# Patient Record
Sex: Female | Born: 1980 | Race: White | Hispanic: No | Marital: Married | Smoking: Never smoker
Health system: Southern US, Community
[De-identification: ages and names within clinical notes are randomized; demographics above are authoritative.]

## PROBLEM LIST (undated history)

## (undated) DIAGNOSIS — D649 Anemia, unspecified: Secondary | ICD-10-CM

## (undated) DIAGNOSIS — F329 Major depressive disorder, single episode, unspecified: Secondary | ICD-10-CM

## (undated) DIAGNOSIS — R251 Tremor, unspecified: Secondary | ICD-10-CM

## (undated) DIAGNOSIS — N200 Calculus of kidney: Secondary | ICD-10-CM

## (undated) DIAGNOSIS — F32A Depression, unspecified: Secondary | ICD-10-CM

## (undated) DIAGNOSIS — O139 Gestational [pregnancy-induced] hypertension without significant proteinuria, unspecified trimester: Secondary | ICD-10-CM

## (undated) DIAGNOSIS — Z8489 Family history of other specified conditions: Secondary | ICD-10-CM

## (undated) DIAGNOSIS — K219 Gastro-esophageal reflux disease without esophagitis: Secondary | ICD-10-CM

## (undated) DIAGNOSIS — O24419 Gestational diabetes mellitus in pregnancy, unspecified control: Secondary | ICD-10-CM

## (undated) DIAGNOSIS — K2970 Gastritis, unspecified, without bleeding: Secondary | ICD-10-CM

## (undated) DIAGNOSIS — F429 Obsessive-compulsive disorder, unspecified: Secondary | ICD-10-CM

## (undated) DIAGNOSIS — R51 Headache: Secondary | ICD-10-CM

## (undated) DIAGNOSIS — F419 Anxiety disorder, unspecified: Secondary | ICD-10-CM

## (undated) HISTORY — DX: Gastro-esophageal reflux disease without esophagitis: K21.9

## (undated) HISTORY — DX: Obsessive-compulsive disorder, unspecified: F42.9

## (undated) HISTORY — DX: Gestational diabetes mellitus in pregnancy, unspecified control: O24.419

## (undated) HISTORY — PX: MOUTH SURGERY: SHX715

## (undated) HISTORY — DX: Major depressive disorder, single episode, unspecified: F32.9

## (undated) HISTORY — DX: Anxiety disorder, unspecified: F41.9

## (undated) HISTORY — DX: Headache: R51

## (undated) HISTORY — DX: Anemia, unspecified: D64.9

## (undated) HISTORY — DX: Depression, unspecified: F32.A

## (undated) HISTORY — PX: LITHOTRIPSY: SUR834

---
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
## 2001-12-24 ENCOUNTER — Other Ambulatory Visit: Admission: RE | Admit: 2001-12-24 | Discharge: 2001-12-24 | Payer: Self-pay | Admitting: *Deleted

## 2003-01-04 ENCOUNTER — Other Ambulatory Visit: Admission: RE | Admit: 2003-01-04 | Discharge: 2003-01-04 | Payer: Self-pay | Admitting: Obstetrics & Gynecology

## 2004-09-28 ENCOUNTER — Inpatient Hospital Stay (HOSPITAL_COMMUNITY): Admission: AD | Admit: 2004-09-28 | Discharge: 2004-10-01 | Payer: Self-pay | Admitting: Obstetrics and Gynecology

## 2004-10-05 ENCOUNTER — Encounter: Admission: RE | Admit: 2004-10-05 | Discharge: 2004-10-20 | Payer: Self-pay | Admitting: Obstetrics and Gynecology

## 2008-06-22 ENCOUNTER — Encounter: Admission: RE | Admit: 2008-06-22 | Discharge: 2008-06-22 | Payer: Self-pay | Admitting: Obstetrics and Gynecology

## 2008-08-27 ENCOUNTER — Inpatient Hospital Stay (HOSPITAL_COMMUNITY): Admission: AD | Admit: 2008-08-27 | Discharge: 2008-08-30 | Payer: Self-pay | Admitting: Obstetrics and Gynecology

## 2008-12-05 ENCOUNTER — Emergency Department (HOSPITAL_BASED_OUTPATIENT_CLINIC_OR_DEPARTMENT_OTHER): Admission: EM | Admit: 2008-12-05 | Discharge: 2008-12-06 | Payer: Self-pay | Admitting: Emergency Medicine

## 2010-10-22 NOTE — L&D Delivery Note (Signed)
Delivery Note At  a viable and healthy female was delivered via  (Presentation: ; ROA ).  APGAR: 8/9 weight 7 pounds and 14 oz.   Placenta status: S/I with 3 vessel  Cord:  with the following complications: .None  Anesthesia:  Epidural  Episiotomy: none Lacerations: none Suture Repair: none Est. Blood Loss (mL): 300  Mom to postpartum.  Baby to nursery-stable.  Catheryne Deford J 07/06/2011, 4:02 PM

## 2010-12-07 LAB — HIV ANTIBODY (ROUTINE TESTING W REFLEX): HIV: NONREACTIVE

## 2010-12-07 LAB — ANTIBODY SCREEN: Antibody Screen: NEGATIVE

## 2011-03-06 NOTE — H&P (Signed)
NAME:  Charlotte Nelson, STAHLE             ACCOUNT NO.:  [id number]   MEDICAL RECORD NO.:  [id number]          PATIENT TYPE:  INP   LOCATION:  9163                          FACILITY:  [facility]   PHYSICIAN:  Lenoard Aden, M.D.DATE OF BIRTH:  22-Sep-1981   DATE OF ADMISSION:  08/27/2008  DATE OF DISCHARGE:                              HISTORY & PHYSICAL   CHIEF COMPLAINT:  Class A2 diabetes mellitus at 39 weeks for induction.   She is a 30 year old G2, P1 at [redacted] weeks gestation who presents for  cervical ripening induction with class A2 diabetes mellitus now at 39  weeks.   She has allergies to SULFA.   MEDICATIONS:  Prenatal vitamins and glyburide.   FAMILY HISTORY:  1. Hypertension.  2. Breast cancer.  3. Lymphoma.  4. Heart disease.  5. Diabetes.  6. Kidney stones.  7. Migraine headaches.  8. Anxiety disorder.   She is a nonsmoker and nondrinker.  Denies domestic or physical  violence.   OBSTETRIC HISTORY:  Uncomplicated vaginal delivery over 7 pounds 13  ounces fetus complicated by severe preeclampsia in 2005.  Prenatal  course complicated by class A2 diabetes mellitus, controlled on  glyburide.   PHYSICAL EXAMINATION:  GENERAL:  She is a well-developed, well-nourished  white female in no acute distress.  HEENT: Normal.  LUNGS:  Clear.  HEART:  Regular rate and rhythm.  ABDOMEN:  Soft, gravid, and nontender.  Estimated fetal weight 7-1/2  pounds.  Cervix is 1-2, 50% vertex, -1. EXTREMITIES:  No cords.  NEURO:  Nonfocal.  SKIN:  Intact.   Blood sugar is 84.  NST is reactive.  Cervidil was placed.   IMPRESSION:  1. A 39-week intrauterine pregnancy.  2. Class A2 diabetes mellitus.   We are going to proceed with cervical ripening induction.  Anticipate  attempts at vaginal delivery.      Lenoard Aden, M.D.  Electronically Signed     RJT/MEDQ  D:  08/27/2008  T:  08/28/2008  Job:  [id number]

## 2011-03-09 NOTE — H&P (Signed)
NAME:  Charlotte Nelson, CHUONG             ACCOUNT NO.:  [id number]   MEDICAL RECORD NO.:  [id number]          PATIENT TYPE:  INP   LOCATION:  9169                          [facility]ACILITY:  [facility]   PHYSICIAN:  Lenoard Aden, M.D.DATE OF BIRTH:  20-Jul-1981   DATE OF ADMISSION:  09/28/2004  DATE OF DISCHARGE:                                HISTORY & PHYSICAL   CHIEF COMPLAINT:  Mild preeclampsia.   HISTORY OF PRESENT ILLNESS:  She is a 30 year old white female, G1, P0, [facility]  October 23, 2004 at 36-3/7th weeks, who presents with proteinuria and  hypertension.   ALLERGIES:  SULFA.   MEDICATIONS:  Prenatal vitamins.   OBSTETRICAL HISTORY:  Noncontributory.   PAST MEDICAL HISTORY:  1.  History of migraine headaches.  2.  History of depression.  3.  History of wisdom tooth removal.   FAMILY HISTORY:  Urolithiasis, hypothyroidism, congestive heart failure, and  hypertension.   PRENATAL LABORATORY DATA:  Blood type of 0 negative.  Status post RhoGAM at  28 weeks.  Toxoplasmosis negative.  VDRL nonreactive.  Rubella immune.  Hepatitis nonreactive.  HIV nonreactive.  GBS is negative.   PREGNANCY COURSE:  Uncomplicated until [redacted] weeks gestation, at which time  mild hypertension was noted.  She has had a 24-hour urine revealing 400 mg  of protein in her urine result today and a blood pressure of 150/96.   REVIEW OF SYMPTOMS:  Reveals no evidence of headache, visual symptoms or  epigastric pain.   PHYSICAL EXAMINATION:  GENERAL:  The patient is a well-developed, well-  nourished white female in no acute distress.  VITAL SIGNS:  Weight 192.  Blood pressure 150/96 with proteinuria noted.  HEENT:  Normal.  LUNGS:  Clear.  HEART:  Regular rate and rhythm.  ABDOMEN:  Soft, gravid, and nontender.  Estimated fetal weight is about 7-  1/2 pounds.  No right upper quadrant tenderness is noted.  EXTREMITIES:  Deep tendon reflexes are 2 to 3+ with 1+ pretibial edema.  No  clonus noted.  No cords.  PELVIC:   Cervix is 2 cm thick.  Vertex is -2.  NEUROLOGICAL:  Nonfocal.   IMPRESSION:  1.  A 36-3/7th week intrauterine pregnancy.  2.  Mild preeclampsia with proteinuria and hypertension.   PLAN:  Proceed with cervical ripening in attempts at vaginal delivery.  Magnesium and sulfate and pending serial blood pressures and PIH panel on  admission.      RJT/MEDQ  D:  09/28/2004  T:  09/28/2004  Job:  [id number]   cc:   [facility]

## 2011-04-18 ENCOUNTER — Encounter: Payer: Managed Care, Other (non HMO) | Attending: Obstetrics and Gynecology

## 2011-06-20 DIAGNOSIS — Z01419 Encounter for gynecological examination (general) (routine) without abnormal findings: Secondary | ICD-10-CM

## 2011-06-23 ENCOUNTER — Inpatient Hospital Stay (HOSPITAL_COMMUNITY)
Admission: AD | Admit: 2011-06-23 | Discharge: 2011-06-23 | Disposition: A | Discharge status: Home / Self Care | Payer: Managed Care, Other (non HMO) | Source: Ambulatory Visit | Attending: Obstetrics & Gynecology | Admitting: Obstetrics & Gynecology

## 2011-06-23 ENCOUNTER — Encounter (HOSPITAL_COMMUNITY): Payer: Self-pay

## 2011-06-23 DIAGNOSIS — Z01419 Encounter for gynecological examination (general) (routine) without abnormal findings: Secondary | ICD-10-CM

## 2011-06-23 NOTE — ED Provider Notes (Signed)
Chief Complaint:  Rupture of Membranes   Charlotte Nelson is  30 y.o. G1P0.  No LMP recorded. Patient is pregnant..  Her pregnancy status is positive.  She presents complaining of Rupture of Membranes . Onset is described as gradual and has been present for  1 days. Noticed that panties have been wetter than usual  OB History    Grav Para Term Preterm Abortions TAB SAB Ect Mult Living   1                No past medical history on file.  No past surgical history on file.  No family history on file.  History  Substance Use Topics  . Smoking status: Not on file  . Smokeless tobacco: Not on file  . Alcohol Use: Not on file    Allergies:  Allergies  Allergen Reactions  . Celexa Itching    Pt states that this medication causes prolonged itching.....lasting for about a month.    Prescriptions prior to admission  Medication Sig Dispense Refill  . cetirizine (ZYRTEC) 10 MG tablet Take 10 mg by mouth daily as needed. For allergies       . glyBURIDE (DIABETA) 2.5 MG tablet Take 1.25 mg by mouth 2 (two) times daily with a meal. Pt takes 1/2 tablet at breakfast and 1/2 tablet at dinner       . prenatal vitamin w/FE, FA (PRENATAL 1 + 1) 27-1 MG TABS Take 1 tablet by mouth daily.        . ranitidine (ZANTAC) 150 MG capsule Take 150 mg by mouth at bedtime.          Review of Systems - Negative except damp panties  Physical Exam   Blood pressure 125/87, pulse 109, temperature 99 F (37.2 C), temperature source Oral, resp. rate 18, height 5\' 4"  (1.626 m), weight 87.181 kg (192 lb 3.2 oz).  General: General appearance - alert, well appearing, and in no distress Chest - clear to auscultation, no wheezes, rales or rhonchi, symmetric air entry Heart - normal rate and regular rhythm Pelvic - normal external genitalia, vulva, vagina, cervix.  Normal appearing discharge.  Negative valsalva and fern, WET MOUNT done - results: negative for pathogens, normal epithelial cells,  Focused  Gynecological Exam: CX 1/75/-3  Labs: Recent Results (from the past 24 hour(s))  POCT FERN TEST   Collection Time   06/23/11  3:21 PM      Component Value Range   Fern Test Negative     Imaging Studies:  No results found.   Assessment: There is no problem list on file for this patient.   Plan: Dr. Seymour Bars called and given report on this pt  CRESENZO-DISHMAN,Terena Bohan

## 2011-06-23 NOTE — Progress Notes (Signed)
G3 P2 presents to MAU for ?? ROM

## 2011-06-23 NOTE — Progress Notes (Signed)
Was having sticky yellow tinged discharge, soaked through a panty liner clear fluid, had contractions Friday night, having loosing stools with nausea x 3 days.

## 2011-07-04 ENCOUNTER — Telehealth (HOSPITAL_COMMUNITY): Payer: Self-pay | Admitting: *Deleted

## 2011-07-04 ENCOUNTER — Encounter (HOSPITAL_COMMUNITY): Payer: Self-pay | Admitting: *Deleted

## 2011-07-04 NOTE — Telephone Encounter (Signed)
Preadmission screen  

## 2011-07-06 ENCOUNTER — Encounter (HOSPITAL_COMMUNITY): Payer: Self-pay

## 2011-07-06 ENCOUNTER — Encounter (HOSPITAL_COMMUNITY): Payer: Self-pay | Admitting: Anesthesiology

## 2011-07-06 ENCOUNTER — Inpatient Hospital Stay (HOSPITAL_COMMUNITY): Payer: Managed Care, Other (non HMO) | Admitting: Anesthesiology

## 2011-07-06 ENCOUNTER — Inpatient Hospital Stay (HOSPITAL_COMMUNITY)
Admission: RE | Admit: 2011-07-06 | Discharge: 2011-07-08 | DRG: 775 | Disposition: A | Discharge status: Home / Self Care | Payer: Managed Care, Other (non HMO) | Source: Ambulatory Visit | Attending: Obstetrics and Gynecology | Admitting: Obstetrics and Gynecology

## 2011-07-06 DIAGNOSIS — O24414 Gestational diabetes mellitus in pregnancy, insulin controlled: Secondary | ICD-10-CM | POA: Diagnosis present

## 2011-07-06 DIAGNOSIS — O24419 Gestational diabetes mellitus in pregnancy, unspecified control: Secondary | ICD-10-CM

## 2011-07-06 DIAGNOSIS — O99814 Abnormal glucose complicating childbirth: Principal | ICD-10-CM | POA: Diagnosis present

## 2011-07-06 HISTORY — DX: Gestational diabetes mellitus in pregnancy, unspecified control: O24.419

## 2011-07-06 LAB — CBC
MCHC: 33 g/dL (ref 30.0–36.0)
Platelets: 161 10*3/uL (ref 150–400)
RDW: 14.7 % (ref 11.5–15.5)
WBC: 11.8 10*3/uL — ABNORMAL HIGH (ref 4.0–10.5)

## 2011-07-06 LAB — RPR: RPR Ser Ql: NONREACTIVE

## 2011-07-06 LAB — GLUCOSE, RANDOM: Glucose, Bld: 135 mg/dL — ABNORMAL HIGH (ref 70–99)

## 2011-07-06 MED ORDER — DIBUCAINE 1 % RE OINT: 1.0000 "application " | TOPICAL_OINTMENT | RECTAL | Status: DC | PRN

## 2011-07-06 MED ORDER — PHENYLEPHRINE 40 MCG/ML (10ML) SYRINGE FOR IV PUSH (FOR BLOOD PRESSURE SUPPORT)
80.0000 ug | PREFILLED_SYRINGE | INTRAVENOUS | Status: DC | PRN
  Filled 2011-07-06 (×2): qty 5

## 2011-07-06 MED ORDER — DIPHENHYDRAMINE HCL 25 MG PO CAPS: 25.0000 mg | ORAL_CAPSULE | Freq: Four times a day (QID) | ORAL | Status: DC | PRN

## 2011-07-06 MED ORDER — FLEET ENEMA 7-19 GM/118ML RE ENEM: 1.0000 | ENEMA | RECTAL | Status: DC | PRN

## 2011-07-06 MED ORDER — ONDANSETRON HCL 4 MG/2ML IJ SOLN: 4.0000 mg | INTRAMUSCULAR | Status: DC | PRN

## 2011-07-06 MED ORDER — ZOLPIDEM TARTRATE 5 MG PO TABS
5.0000 mg | ORAL_TABLET | Freq: Every evening | ORAL | Status: DC | PRN
Start: 2011-07-06 — End: 2011-07-08

## 2011-07-06 MED ORDER — LACTATED RINGERS IV SOLN
INTRAVENOUS | Status: DC
  Administered 2011-07-06 (×2): via INTRAVENOUS

## 2011-07-06 MED ORDER — BENZOCAINE-MENTHOL 20-0.5 % EX AERO: 1.0000 "application " | INHALATION_SPRAY | CUTANEOUS | Status: DC | PRN

## 2011-07-06 MED ORDER — SENNOSIDES-DOCUSATE SODIUM 8.6-50 MG PO TABS
2.0000 | ORAL_TABLET | Freq: Every day | ORAL | Status: DC
  Administered 2011-07-06: 2 via ORAL

## 2011-07-06 MED ORDER — PRENATAL PLUS 27-1 MG PO TABS
1.0000 | ORAL_TABLET | Freq: Every day | ORAL | Status: DC
  Administered 2011-07-07 – 2011-07-08 (×2): 1 via ORAL
  Filled 2011-07-06 (×2): qty 1

## 2011-07-06 MED ORDER — WITCH HAZEL-GLYCERIN EX PADS: 1.0000 "application " | MEDICATED_PAD | CUTANEOUS | Status: DC | PRN

## 2011-07-06 MED ORDER — TERBUTALINE SULFATE 1 MG/ML IJ SOLN: 0.2500 mg | Freq: Once | INTRAMUSCULAR | Status: DC | PRN

## 2011-07-06 MED ORDER — OXYCODONE-ACETAMINOPHEN 5-325 MG PO TABS
1.0000 | ORAL_TABLET | ORAL | Status: DC | PRN
  Administered 2011-07-07 – 2011-07-08 (×6): 1 via ORAL
  Filled 2011-07-06 (×3): qty 1
  Filled 2011-07-06: qty 2
  Filled 2011-07-06 (×2): qty 1

## 2011-07-06 MED ORDER — CITRIC ACID-SODIUM CITRATE 334-500 MG/5ML PO SOLN: 30.0000 mL | ORAL | Status: DC | PRN

## 2011-07-06 MED ORDER — DIPHENHYDRAMINE HCL 50 MG/ML IJ SOLN: 12.5000 mg | INTRAMUSCULAR | Status: DC | PRN

## 2011-07-06 MED ORDER — METHYLERGONOVINE MALEATE 0.2 MG/ML IJ SOLN: 0.2000 mg | INTRAMUSCULAR | Status: DC | PRN

## 2011-07-06 MED ORDER — METHYLERGONOVINE MALEATE 0.2 MG PO TABS: 0.2000 mg | ORAL_TABLET | ORAL | Status: DC | PRN

## 2011-07-06 MED ORDER — ACETAMINOPHEN 325 MG PO TABS: 650.0000 mg | ORAL_TABLET | ORAL | Status: DC | PRN

## 2011-07-06 MED ORDER — ONDANSETRON HCL 4 MG PO TABS: 4.0000 mg | ORAL_TABLET | ORAL | Status: DC | PRN

## 2011-07-06 MED ORDER — LACTATED RINGERS IV SOLN: 500.0000 mL | Freq: Once | INTRAVENOUS | Status: DC

## 2011-07-06 MED ORDER — TETANUS-DIPHTH-ACELL PERTUSSIS 5-2.5-18.5 LF-MCG/0.5 IM SUSP: 0.5000 mL | Freq: Once | INTRAMUSCULAR | Status: DC

## 2011-07-06 MED ORDER — EPHEDRINE 5 MG/ML INJ
10.0000 mg | INTRAVENOUS | Status: DC | PRN
  Filled 2011-07-06 (×2): qty 4

## 2011-07-06 MED ORDER — ONDANSETRON HCL 4 MG/2ML IJ SOLN: 4.0000 mg | Freq: Four times a day (QID) | INTRAMUSCULAR | Status: DC | PRN

## 2011-07-06 MED ORDER — OXYTOCIN BOLUS FROM INFUSION
500.0000 mL | Freq: Once | INTRAVENOUS | Status: DC
  Filled 2011-07-06: qty 500
  Filled 2011-07-06: qty 1000

## 2011-07-06 MED ORDER — FENTANYL 2.5 MCG/ML BUPIVACAINE 1/10 % EPIDURAL INFUSION (WH - ANES)
14.0000 mL/h | INTRAMUSCULAR | Status: DC
  Administered 2011-07-06 (×2): 14 mL/h via EPIDURAL
  Filled 2011-07-06 (×2): qty 60

## 2011-07-06 MED ORDER — SIMETHICONE 80 MG PO CHEW: 80.0000 mg | CHEWABLE_TABLET | ORAL | Status: DC | PRN

## 2011-07-06 MED ORDER — LACTATED RINGERS IV SOLN
500.0000 mL | INTRAVENOUS | Status: DC | PRN
  Administered 2011-07-06: 1000 mL via INTRAVENOUS

## 2011-07-06 MED ORDER — LIDOCAINE HCL (PF) 1 % IJ SOLN
30.0000 mL | INTRAMUSCULAR | Status: DC | PRN
  Filled 2011-07-06 (×2): qty 30

## 2011-07-06 MED ORDER — LIDOCAINE HCL 1.5 % IJ SOLN
INTRAMUSCULAR | Status: DC | PRN
  Administered 2011-07-06: 2 mL via EPIDURAL
  Administered 2011-07-06 (×2): 5 mL via EPIDURAL

## 2011-07-06 MED ORDER — EPHEDRINE 5 MG/ML INJ
10.0000 mg | INTRAVENOUS | Status: DC | PRN
  Filled 2011-07-06: qty 4

## 2011-07-06 MED ORDER — OXYTOCIN 20 UNITS IN LACTATED RINGERS INFUSION - SIMPLE
1.0000 m[IU]/min | INTRAVENOUS | Status: DC
  Administered 2011-07-06: 4 m[IU]/min via INTRAVENOUS
  Administered 2011-07-06: 2 m[IU]/min via INTRAVENOUS

## 2011-07-06 MED ORDER — OXYCODONE-ACETAMINOPHEN 5-325 MG PO TABS: 2.0000 | ORAL_TABLET | ORAL | Status: DC | PRN

## 2011-07-06 MED ORDER — IBUPROFEN 600 MG PO TABS
600.0000 mg | ORAL_TABLET | Freq: Four times a day (QID) | ORAL | Status: DC
  Administered 2011-07-06 – 2011-07-08 (×8): 600 mg via ORAL
  Filled 2011-07-06 (×9): qty 1

## 2011-07-06 MED ORDER — OXYTOCIN 20 UNITS IN LACTATED RINGERS INFUSION - SIMPLE: 125.0000 mL/h | Freq: Once | INTRAVENOUS | Status: DC

## 2011-07-06 MED ORDER — IBUPROFEN 600 MG PO TABS: 600.0000 mg | ORAL_TABLET | Freq: Four times a day (QID) | ORAL | Status: DC | PRN

## 2011-07-06 MED ORDER — LANOLIN HYDROUS EX OINT: TOPICAL_OINTMENT | CUTANEOUS | Status: DC | PRN

## 2011-07-06 MED ORDER — PHENYLEPHRINE 40 MCG/ML (10ML) SYRINGE FOR IV PUSH (FOR BLOOD PRESSURE SUPPORT)
80.0000 ug | PREFILLED_SYRINGE | INTRAVENOUS | Status: DC | PRN
  Administered 2011-07-06: 80 ug via INTRAVENOUS
  Filled 2011-07-06: qty 5

## 2011-07-06 NOTE — Anesthesia Procedure Notes (Signed)
Epidural Patient location during procedure: OB Start time: 07/06/2011 12:10 PM Reason for block: procedure for pain  Staffing Performed by: anesthesiologist   Preanesthetic Checklist Completed: patient identified, site marked, surgical consent, pre-op evaluation, timeout performed, IV checked, risks and benefits discussed and monitors and equipment checked  Epidural Patient position: sitting Prep: site prepped and draped and DuraPrep Patient monitoring: continuous pulse ox and blood pressure Approach: midline Injection technique: LOR air  Needle:  Needle type: Tuohy  Needle gauge: 17 G Needle length: 9 cm Needle insertion depth: 5 cm cm Catheter type: closed end flexible Catheter size: 19 Gauge Catheter at skin depth: 10 cm Test dose: negative  Assessment Events: blood not aspirated, injection not painful, no injection resistance, negative IV test and no paresthesia

## 2011-07-06 NOTE — Anesthesia Postprocedure Evaluation (Signed)
  Anesthesia Post-op Note  Patient: Charlotte Nelson  Procedure(s) Performed: * No procedures listed *  Patient Location: PACU and Mother/Baby  Anesthesia Type: Epidural  Level of Consciousness: awake, alert  and oriented  Airway and Oxygen Therapy: Patient Spontanous Breathing   Post-op Assessment: Post-op Vital signs reviewed, Patient's Cardiovascular Status Stable, No headache, No backache, No residual numbness and No residual motor weakness  Post-op Vital Signs: Reviewed and stable  Complications: No apparent anesthesia complications

## 2011-07-06 NOTE — Anesthesia Preprocedure Evaluation (Signed)
Anesthesia Evaluation  Name, MR# and DOB Patient awake  General Assessment Comment  Reviewed: Allergy & Precautions, H&P , NPO status , Patient's Chart, lab work & pertinent test results, reviewed documented beta blocker date and time   History of Anesthesia Complications Negative for: history of anesthetic complications  Airway Mallampati: II TM Distance: >3 FB Neck ROM: full    Dental  (+) Teeth Intact   Pulmonary  clear to auscultation  breath sounds clear to auscultation none    Cardiovascular regular Normal    Neuro/Psych   Headaches (ocular migraines)  (+) PSYCHIATRIC DISORDERS (OCD, anxiety, depression),    GI/Hepatic/Renal   negative Liver ROS  negative Renal ROS   GERD      Endo/Other  (+) Diabetes mellitus-, Gestational, Oral Hypoglycemic Agents,     Abdominal   Musculoskeletal   Hematology negative hematology ROS (+)   Peds  Reproductive/Obstetrics (+) Pregnancy    Anesthesia Other Findings             Anesthesia Physical Anesthesia Plan  ASA: II  Anesthesia Plan: Epidural   Post-op Pain Management:    Induction:   Airway Management Planned:   Additional Equipment:   Intra-op Plan:   Post-operative Plan:   Informed Consent: I have reviewed the patients History and Physical, chart, labs and discussed the procedure including the risks, benefits and alternatives for the proposed anesthesia with the patient or authorized representative who has indicated his/her understanding and acceptance.     Plan Discussed with:   Anesthesia Plan Comments:         Anesthesia Quick Evaluation

## 2011-07-06 NOTE — Progress Notes (Signed)
Charlotte Nelson is a 30 y.o. [id number] at [redacted]w[redacted]d by LMP admitted for induction of labor due to Diabetes.  Subjective:   Objective: BP 127/88  Pulse 87  Temp(Src) 98.5 F (36.9 C) (Oral)  Resp 18  Ht 5\' 4"  (1.626 m)  Wt 89.812 kg (198 lb)  BMI 33.99 kg/m2      FHT:  FHR: 150 bpm, variability: moderate,  accelerations:  Present,  decelerations:  Absent UC:   irregular, every 4 minutes SVE:   Dilation: 2.5 Effacement (%): 50 Station: -2 Exam by:: d herr rn AROM- clear Labs: Lab Results  Component Value Date   WBC 11.8* 07/06/2011   HGB 12.5 07/06/2011   HCT 37.9 07/06/2011   MCV 78.3 07/06/2011   PLT 161 07/06/2011    Assessment / Plan: Induction of labor due to gestational diabetes,  progressing well on pitocin  Labor: Progressing on Pitocin, will continue to increase then AROM Preeclampsia:  na Fetal Wellbeing:  Category I Pain Control:  Labor support without medications I/D:  n/a Anticipated MOD:  NSVD  Charlotte Nelson J 07/06/2011, 9:38 AM

## 2011-07-07 ENCOUNTER — Encounter (HOSPITAL_COMMUNITY): Payer: Self-pay

## 2011-07-07 DIAGNOSIS — O24419 Gestational diabetes mellitus in pregnancy, unspecified control: Secondary | ICD-10-CM

## 2011-07-07 DIAGNOSIS — O24414 Gestational diabetes mellitus in pregnancy, insulin controlled: Secondary | ICD-10-CM | POA: Diagnosis present

## 2011-07-07 HISTORY — DX: Gestational diabetes mellitus in pregnancy, unspecified control: O24.419

## 2011-07-07 LAB — CBC
MCHC: 32.6 g/dL (ref 30.0–36.0)
MCV: 78.7 fL (ref 78.0–100.0)
Platelets: 129 10*3/uL — ABNORMAL LOW (ref 150–400)
RDW: 14.9 % (ref 11.5–15.5)
WBC: 12.1 10*3/uL — ABNORMAL HIGH (ref 4.0–10.5)

## 2011-07-07 MED ORDER — RHO D IMMUNE GLOBULIN 1500 UNIT/2ML IJ SOLN
300.0000 ug | Freq: Once | INTRAMUSCULAR | Status: AC
  Administered 2011-07-07: 300 ug via INTRAMUSCULAR
  Filled 2011-07-07: qty 2

## 2011-07-07 NOTE — Progress Notes (Signed)
  Post Partum Day #1            Subjective:   Pt reports feeling well, but tired/ Tolerating po/ Voiding without problems/ No n/v/ Bleeding is moderate/ Pain controlled withprescription NSAID's including Motrin  Feels ears are stuffy, no pain or vertigo.  Newborn info:  Information for the patient's newborn:  Charlotte Nelson, Girl Meganne [[id number]]  female   feeding breast          Objective:     VS: Blood pressure 106/74, pulse 64, temperature 97.8 F (36.6 C), temperature source Oral, resp. rate 20, height 5\' 4"  (1.626 m), weight 89.812 kg (198 lb), SpO2 96.00%, currently breastfeeding.    Intake/Output Summary (Last 24 hours) at 07/07/11 0933 Last data filed at 07/06/11 1606  Gross per 24 hour  Intake      0 ml  Output    300 ml  Net   -300 ml         Basename 07/07/11 0500 07/06/11 0700  WBC 12.1* 11.8*  HGB 11.1* 12.5  HCT 34.0* 37.9  PLT 129* 161     Blood type: --/--/O NEG (09/15 0525)/ infant Rh pos  Rubella: Immune (02/16 0000)     Physical Exam:   A & O x 3 NAD   Lungs: CTAB  Heart: RR  Abdomen: soft, non-tender, FF @ U-1  Perineum: intact, edema minimal  Lochia: small  Extremities: neg Homans', edema none    Assessment/Plan:  PPD # 1 / 30 y.o., [id number] S/P:induced vaginal Principal Problem:  *Postpartum care following vaginal delivery Active Problems:  Gestational diabetes mellitus, class A2   normal postpartum exam  Doing well  Continue routine post partum orders  Anticipate D/c in AM     LOS: 1 day   [name], CNM, MSN 07/07/2011, 9:33 AM

## 2011-07-07 NOTE — H&P (Signed)
NAMEAMIYA, Charlotte Nelson             ACCOUNT NO.:  [id number]  MEDICAL RECORD NO.:  [id number]  LOCATION:  9116                          FACILITY:  [facility]  PHYSICIAN:  Lenoard Aden, M.D.DATE OF BIRTH:  11-16-1980  DATE OF ADMISSION:  07/06/2011 DATE OF DISCHARGE:                             HISTORY & PHYSICAL   CHIEF COMPLAINT:  Induction.  HISTORY OF PRESENT ILLNESS:  She is a 30 year old white female G3, P2, with a history of history of vaginal delivery x2 who presents for induction at 39 weeks due to class A2, medication dependent diabetes mellitus.  Her prenatal course was complicated as noted.  ALLERGIES:  She has allergies to CELEXA.  MEDICATIONS:  Prenatal vitamins and no other medications.  PERSONAL HISTORY:  Remarkable for migraine headache, anxiety, depression, OCD, and reflux.  SOCIAL HISTORY:  Noncontributory.  She is a nonsmoker, nondrinker.  She denies domestic or physical violence.  FAMILY HISTORY:  Kidney stones, lymphoma, breast cancer, migraine headache, heart disease, lung cancer, diabetes, stroke, COPD, and anxiety, history of 2 vaginal deliveries as previously noted.  Prenatal course complicated by Glyburide-dependent diabetes mellitus for which her fastings ranged from 90-100 range.  PHYSICAL EXAMINATION:  GENERAL:  Well-developed, well-nourished white female in no acute distress. HEENT:  Normal. NECK:  Supple.  Full range of motion. LUNGS:  Clear. HEART:  Regular rate and rhythm. ABDOMEN:  Soft, gravid, and nontender.  Estimated fetal weight 8 pounds. Cervix is 2-3, 80% vertex, -2. EXTREMITIES:  There are no cords. NEUROLOGIC:  Nonfocal. SKIN:  Intact.  NST is reactive.  Category one tracing noted.  IMPRESSION:  A 39 weeks OB or intrauterine pregnancy with fasting in the 90-100 range.  Check glucose q.4 h. and anticipated attempts at vaginal delivery, epidural as needed.     Lenoard Aden, M.D.     RJT/MEDQ  D:  07/06/2011  T:   07/07/2011  Job:  [id number]

## 2011-07-08 MED ORDER — IBUPROFEN 600 MG PO TABS: 600.0000 mg | ORAL_TABLET | Freq: Four times a day (QID) | ORAL | Status: AC

## 2011-07-08 MED ORDER — OXYCODONE-ACETAMINOPHEN 5-325 MG PO TABS: 1.0000 | ORAL_TABLET | ORAL | Status: AC | PRN

## 2011-07-08 NOTE — Discharge Summary (Signed)
Obstetric Discharge Summary Reason for Admission: induction of labor, GDM A2 Prenatal Procedures: NST and ultrasound Intrapartum Procedures: spontaneous vaginal delivery Postpartum Procedures: Rho(D) Ig Complications-Operative and Postpartum: none Hemoglobin  Date Value Range Status  07/07/2011 11.1* 12.0-15.0 (g/dL) Final     HCT  Date Value Range Status  07/07/2011 34.0* 36.0-46.0 (%) Final    Discharge Diagnoses: Term Pregnancy-delivered  Discharge Information: Date: 07/08/2011 Activity: pelvic rest Diet: routine Medications: PNV, Ibuprophen and Percocet Condition: stable Instructions: refer to practice specific booklet Discharge to: home Follow-up Information    Follow up with TAVOON,RICHARD. Make an appointment in 6 weeks.         Newborn Data: Live born female  Birth Weight: 7 lb 14.3 oz (3581 g) APGAR: 9, 9  Home with mother.  Charlotte Nelson 07/08/2011, 9:29 AM

## 2011-07-08 NOTE — Progress Notes (Signed)
Post Partum Day #2           Information for the patient's newborn:  Robar, Girl Ziyanna [[id number]]  female    Subjective: No HA, SOB, CP, F/C, breast symptoms. Pain  Controlled w/ Motrin and Percocet. Normal vaginal bleeding, no clots.    Feeding: breast  Objective: BP 117/77  Pulse 78  Temp(Src) 98.1 F (36.7 C) (Oral)  Resp 18  Ht 5\' 4"  (1.626 m)  Wt 89.812 kg (198 lb)  BMI 33.99 kg/m2  SpO2 97%  Breastfeeding? Unknown  No intake or output data in the 24 hours ending 07/08/11 0923     Basename 07/07/11 0500 07/06/11 0700  WBC 12.1* 11.8*  HGB 11.1* 12.5  HCT 34.0* 37.9  PLT 129* 161    Blood type: --/--/O NEG (09/15 0525) Rubella: Immune (02/16 0000)    Physical Exam:  General: alert, cooperative and no distress Uterine Fundus: firm Lochia: appropriate Perineum: repair none, edema none DVT Evaluation: Negative Homan's sign. No significant calf/ankle edema.    Assessment/Plan: PPD # 2 / 30 y.o., [id number] S/P:induced vaginal Principal Problem:  *Postpartum care following vaginal delivery (9/14) Active Problems:  Gestational diabetes mellitus, class A2    normal postpartum exam  Continue current postpartum care  Rhogam received  TdaP and flu shot up to date  D/C home   LOS: 2 days   PAUL,DANIELA, CNM, MSN 07/08/2011, 9:23 AM

## 2011-07-09 ENCOUNTER — Ambulatory Visit (HOSPITAL_COMMUNITY)
Admission: RE | Admit: 2011-07-09 | Discharge: 2011-07-09 | Disposition: A | Discharge status: Home / Self Care | Payer: Managed Care, Other (non HMO) | Source: Ambulatory Visit | Attending: Obstetrics and Gynecology | Admitting: Obstetrics and Gynecology

## 2011-07-09 LAB — RH IG WORKUP (INCLUDES ABO/RH)
ABO/RH(D): O NEG
Antibody Screen: POSITIVE
Unit division: 0

## 2011-07-09 NOTE — Progress Notes (Addendum)
Infant Lactation Consultation Outpatient Visit Note  Patient Name: Charlotte Nelson Date of Birth: Nov 18, 1980 Birth Weight:   Gestational Age at Delivery: Gestational Age: <None> Type of Delivery: Vaginal ,   Breastfeeding History :     Infant 58 days old Charlotte Nelson ) DOB+ 07/06/19` Birth wt = 7-14 , D/C wt 7- 7 ( 7% - 7-5/ 10% 7-1 )  Frequency of Breastfeeding: breast and bottle per mom ( some supplementing in the hospital ( didn't seem satisfied ) and at home difficult latch .  Length of Feeding: attempts at the breast and when infant can't latch have been supplementing with a bottle  Voids: 2 since yesterday at d/c  Stools:4 brown stools  Since yesterday at d/c   Supplementing / Method: bottle ( see note above )  Supplemented 6x's at home 10-70ml , last fed at 1230p - 15ml formula   Pumping:  Type of Pump: hand pump form the hospital ,Has DEBP Medela at home ( missing pump pieces)    Frequency:  Volume: NONE    Comments:    Consultation Evaluation:  Initial Feeding Assessment:        Right breast  Pre-feed Weight:7-3.2 ( 3266g )  Post-feed Weight:7-3.8  ( 3283g )  Amount Transferred: none from the breast only 16 ml from the SNS ( formula ) , The use of the SNS was the only way to keep infant in a pattern .  Comments: Infant fed in a consistent swallow and pattern . Mom needed assistance with SNS and obtaining depth at the breast   Additional Feeding Assessment: Pre-feed Weight: 7-3.8 ( 3282g )  Post-feed Weight: 7-4.4 ( 3302g )  Amount Transferred:none from the breast ,only 20 ml from the SNS  Comments:infant stayed in a consistent pattern with multi swallows and consistent feeding pattern . Mom latched the infant onto the left breast without difficulty ,infant stated pulling back ,SNS added after latch and infant able to stay latched . Fed 15- 20 mins ,   Additional Feeding Assessment: Pre-feed Weight: Post-feed Weight: Amount Transferred: Comments:  Total Breast milk  Transferred this Visit:  Total Supplement Given:   Additional Interventions: Mom has sore nipples  Bilaterally , right has small cracks ,left pinky red ,no cracks noted .  Mom also appears to be exhausted  ( noted dark circles under both eyes. , also mentioned she had been  Up all night with the baby ) . PLan of Care _ 1) Rest when the baby sleeps 2) Treatment of sore nipples , 3) engorgement tx if needed .4) Set up SNS with feeding if needed until milk comes in  5) Extra pumping  Starting today after feeds to enhance milk coming in . 5) Plan B - if unable to latch with or with out SNS ,try appetizer with a dropper or bottle . 6) Call Lactation for further assistance .    Follow-Up" per mom pedis apt 07/10/2011 with Dr.Davis Robbie Lis Pedis ./ Lactation PRN if still having problems with latch .       Charlotte Nelson 07/09/2011, 5:16 PM

## 2011-07-13 ENCOUNTER — Encounter (HOSPITAL_COMMUNITY): Payer: Managed Care, Other (non HMO)

## 2011-07-13 ENCOUNTER — Ambulatory Visit (HOSPITAL_COMMUNITY): Admission: RE | Admit: 2011-07-13 | Payer: Managed Care, Other (non HMO) | Source: Ambulatory Visit

## 2011-07-13 NOTE — Progress Notes (Signed)
Infant Lactation Consultation Outpatient Visit Note  Patient Name: Charlotte Nelson Date of Birth: [birth year] Birth Weight:   Gestational Age at Delivery: Gestational Age: <None> Type of Delivery: Vaginal   Breastfeeding History                   This a F/U visit from Dr. Visit Tues. For weight check per Dr. Luz Brazen  Frequency of Breastfeeding: Per mom every 2-3 hours and on demand ( snacks )  approx . 10- 15x;s in 24 hrs.  Length of Feeding: 10-20 minutes.  Voids: 6 or greater  Stools: 5 yellowish brown   Supplementing / Method: Per mom after Monday 9/17 Lactation visit -used the SNS with formula Monday,Tues. And once or twice on Wed. And stopped .   Type of Pump:manual and DEBP Medela    Frequency: X1 since Monday   Volume:    Comments:Per mom is was amazing how well the SNS worked . My Milk came in at 5-6 days ( Yesterday ) ,I'm still alittle sore initially when the Lauris Poag gets on and then it improves .    Consultation Evaluation:  Initial Feeding Assessment: Pre-feed Weight:7-6.5oz ( 3360g )  Post-feed Weight:7-9.5oz ( 3444g)  Amount Transferred:78 ml  Comments:Mom able to latch infant ,initailly mom having discomfort , ( assisted mom in obtaining depth at the breast ,mom's comfort improved greatly and fed 20-74mins. Mom's breast were very full to start with several small nodules ( especially in the lateral breast ) , also had to hand express 3-25ml off the breast to make the aerolo more compressable to assist in a deeper latch.  . During the feeding reviewed with mom and assist with breast compressions (intermittently) , swollen nodules improved . Infant ended feeding , Mom and grandmother wer very impressed with the weight gain. At the feeding .   Additional Feeding Assessment: Infant sleepy after the 1st breast ,offered the 2nd breast infant latched well , the depth at the breast was easier for mom to obtain after hand expressing some milk off 1st . Infant latched and only took a  few sucks and unlatched . Mom dressed the infant and then Lauris Poag woke up for a short feeding .  No post weight due to feeding being just a short snack . Pre-feed Weight: Post-feed Weight: Amount Transferred: Comments:  Additional Feeding Assessment: Pre-feed Weight: Post-feed Weight: Amount Transferred: Comments:  Total Breast milk Transferred this Visit:  Total Supplement Given:   Additional Interventions:  Tx of sore nipples ( NO breakdown noted with assessment  ) Encouraged mom to massage, hand express ,prepump with a hand pump if needed ( making sure aerolois compressable like a thin sandwich before latching . Also EBM to nipples around feedings and in between ,also reviewed engorgement tx if needed .    Follow-Up:                  Per mom Tuesday Sept 25th with Dr. Earlene Plater office for a weight check . And PRN with Lactation office if needed .       Kathrin Greathouse 07/13/2011, 12:18 PM

## 2011-07-24 LAB — CBC
HCT: 34.3 — ABNORMAL LOW
Hemoglobin: 11.3 — ABNORMAL LOW
Hemoglobin: 13.1
MCHC: 32.1
MCHC: 33
MCV: 81.7
RBC: 4.2
RDW: 13.9
RDW: 14.2

## 2011-07-24 LAB — GLUCOSE, CAPILLARY
Glucose-Capillary: 101 — ABNORMAL HIGH
Glucose-Capillary: 87
Glucose-Capillary: 99

## 2011-07-24 LAB — RH IMMUNE GLOB WKUP(>/=20WKS)(NOT WOMEN'S HOSP): Fetal Screen: NEGATIVE

## 2011-07-24 LAB — RPR: RPR Ser Ql: NONREACTIVE

## 2012-02-01 ENCOUNTER — Other Ambulatory Visit: Payer: Self-pay | Admitting: Family Medicine

## 2012-02-01 DIAGNOSIS — R599 Enlarged lymph nodes, unspecified: Secondary | ICD-10-CM

## 2012-02-05 ENCOUNTER — Ambulatory Visit
Admission: RE | Admit: 2012-02-05 | Discharge: 2012-02-05 | Disposition: A | Discharge status: Home / Self Care | Payer: 59 | Source: Ambulatory Visit | Attending: Family Medicine | Admitting: Family Medicine

## 2012-02-05 DIAGNOSIS — R599 Enlarged lymph nodes, unspecified: Secondary | ICD-10-CM

## 2014-07-19 ENCOUNTER — Emergency Department (HOSPITAL_COMMUNITY): Admission: EM | Admit: 2014-07-19 | Discharge: 2014-07-20 | Discharge status: Against Medical Advice | Payer: 59

## 2014-07-19 NOTE — ED Notes (Signed)
Pt gave registation pager and stated "she was going to [facility] since only a 15 min wait"

## 2014-08-23 ENCOUNTER — Encounter (HOSPITAL_COMMUNITY): Payer: Self-pay

## 2015-05-12 ENCOUNTER — Encounter: Payer: Self-pay | Admitting: Sports Medicine

## 2015-05-12 ENCOUNTER — Ambulatory Visit (INDEPENDENT_AMBULATORY_CARE_PROVIDER_SITE_OTHER): Payer: 59 | Admitting: Sports Medicine

## 2015-05-12 VITALS — BP 111/74 | HR 75 | Ht 64.0 in | Wt 165.0 lb

## 2015-05-12 DIAGNOSIS — G250 Essential tremor: Secondary | ICD-10-CM | POA: Diagnosis not present

## 2015-05-12 DIAGNOSIS — F39 Unspecified mood [affective] disorder: Secondary | ICD-10-CM | POA: Insufficient documentation

## 2015-05-12 DIAGNOSIS — R718 Other abnormality of red blood cells: Secondary | ICD-10-CM

## 2015-05-12 DIAGNOSIS — Z0000 Encounter for general adult medical examination without abnormal findings: Secondary | ICD-10-CM | POA: Insufficient documentation

## 2015-05-12 DIAGNOSIS — N200 Calculus of kidney: Secondary | ICD-10-CM | POA: Diagnosis not present

## 2015-05-12 LAB — POCT URINALYSIS DIPSTICK
Bilirubin, UA: NEGATIVE
Glucose, UA: NEGATIVE
Ketones, UA: NEGATIVE
Nitrite, UA: NEGATIVE
Spec Grav, UA: 1.02
Urobilinogen, UA: 0.2
pH, UA: 7.5

## 2015-05-12 LAB — HEMOGLOBIN A1C
Hgb A1c MFr Bld: 5.8 % — ABNORMAL HIGH (ref <5.7)
Mean Plasma Glucose: 120 mg/dL — ABNORMAL HIGH (ref <117)

## 2015-05-12 MED ORDER — CIPROFLOXACIN HCL 750 MG PO TABS: 750.0000 mg | ORAL_TABLET | Freq: Two times a day (BID) | ORAL | Status: DC

## 2015-05-12 MED ORDER — TAMSULOSIN HCL 0.4 MG PO CAPS: 0.4000 mg | ORAL_CAPSULE | Freq: Every day | ORAL | Status: DC

## 2015-05-12 NOTE — Assessment & Plan Note (Signed)
MCV in the 70s at [facility] with fatigue. Checking anemia panel. We will probably treat this with iron.

## 2015-05-12 NOTE — Assessment & Plan Note (Signed)
Stable and controlled on propranolol.

## 2015-05-12 NOTE — Progress Notes (Signed)
  Subjective:    CC: Establish care.   HPI:  This is a pleasant 34 year old female, she comes in with a several week history of left flank pain, radiating to the left anterior abdomen, no constitutional symptoms. She was seen in the emergency department where a CT scan showed a couple nephroliths, the largest of which was in the lower pole of the left kidney, 12 mm, there are a few more in the right kidney. Pain is moderate, persistent, no fevers.  She has an essential tremor which is well controlled on propranolol  Fatigue: CBC recently did show microcytosis, she does have a history of menometrorrhagia.  Mood disorder: Stable on Wellbutrin.  Past medical history, Surgical history, Family history not pertinant except as noted below, Social history, Allergies, and medications have been entered into the medical record, reviewed, and no changes needed.   Review of Systems: No headache, visual changes, nausea, vomiting, diarrhea, constipation, dizziness, abdominal pain, skin rash, fevers, chills, night sweats, swollen lymph nodes, weight loss, chest pain, body aches, joint swelling, muscle aches, shortness of breath, mood changes, visual or auditory hallucinations.  Objective:    General: Well Developed, well nourished, and in no acute distress.  Neuro: Alert and oriented x3, extra-ocular muscles intact, sensation grossly intact.  HEENT: Normocephalic, atraumatic, pupils equal round reactive to light, neck supple, no masses, no lymphadenopathy, thyroid nonpalpable.  Skin: Warm and dry, no rashes noted.  Cardiac: Regular rate and rhythm, no murmurs rubs or gallops.  Respiratory: Clear to auscultation bilaterally. Not using accessory muscles, speaking in full sentences.  Abdominal: Soft, nontender, nondistended, positive bowel sounds, no masses, no organomegaly. Positive for left costovertebral angle tenderness with a positive Lloyd's punch. Musculoskeletal: Shoulder, elbow, wrist, hip, knee,  ankle stable, and with full range of motion.  Urinalysis shows leukocytes and moderate blood  Impression and Recommendations:    The patient was counselled, risk factors were discussed, anticipatory guidance given.

## 2015-05-12 NOTE — Assessment & Plan Note (Signed)
Left flank pain with a 12 mm stone in the lower pole of the kidney, this was on a CT scan at an outside facility. Persistent pain, hematuria. The stone is large enough to likely warrant urology referral for consideration of lithotripsy. Adding a short course of ciprofloxacin, urine strainer, Flomax.

## 2015-05-12 NOTE — Assessment & Plan Note (Signed)
-   Continue Wellbutrin 

## 2015-05-12 NOTE — Assessment & Plan Note (Addendum)
Checking routine blood work. Lipids were recently normal.

## 2015-05-13 LAB — VITAMIN D 25 HYDROXY (VIT D DEFICIENCY, FRACTURES): Vit D, 25-Hydroxy: 28 ng/mL — ABNORMAL LOW (ref 30–100)

## 2015-05-13 LAB — IRON AND TIBC
%SAT: 24 % (ref 20–55)
Iron: 90 ug/dL (ref 42–145)
TIBC: 376 ug/dL (ref 250–470)
UIBC: 286 ug/dL (ref 125–400)

## 2015-05-13 LAB — FOLATE: Folate: 11.7 ng/mL

## 2015-05-13 LAB — VITAMIN B12: Vitamin B-12: 498 pg/mL (ref 211–911)

## 2015-05-13 LAB — FERRITIN: Ferritin: 118 ng/mL (ref 10–291)

## 2015-05-15 LAB — URINE CULTURE: Colony Count: 5000

## 2015-05-16 ENCOUNTER — Encounter: Payer: Self-pay | Admitting: Sports Medicine

## 2015-05-16 DIAGNOSIS — R718 Other abnormality of red blood cells: Secondary | ICD-10-CM

## 2015-05-16 LAB — HEMOGLOBINOPATHY EVALUATION
Hemoglobin Other: 0 %
Hgb A2 Quant: 2.7 % (ref 2.2–3.2)
Hgb A: 97.3 % (ref 96.8–97.8)
Hgb F Quant: 0 % (ref 0.0–2.0)
Hgb S Quant: 0 %

## 2015-05-16 MED ORDER — VITAMIN D (ERGOCALCIFEROL) 1.25 MG (50000 UNIT) PO CAPS: 50000.0000 [IU] | ORAL_CAPSULE | ORAL | Status: DC

## 2015-05-16 NOTE — Addendum Note (Signed)
Addended by: Silverio Decamp on: 05/16/2015 04:34 PM   Modules accepted: Orders

## 2015-05-17 NOTE — Telephone Encounter (Signed)
Please attempt to add a CBC to the existing blood work. Orders placed.

## 2015-06-09 ENCOUNTER — Encounter: Payer: Self-pay | Admitting: Sports Medicine

## 2015-06-09 ENCOUNTER — Ambulatory Visit (INDEPENDENT_AMBULATORY_CARE_PROVIDER_SITE_OTHER): Payer: 59 | Admitting: Sports Medicine

## 2015-06-09 VITALS — BP 120/81 | HR 83 | Ht 64.0 in | Wt 167.0 lb

## 2015-06-09 DIAGNOSIS — N200 Calculus of kidney: Secondary | ICD-10-CM

## 2015-06-09 DIAGNOSIS — R718 Other abnormality of red blood cells: Secondary | ICD-10-CM

## 2015-06-09 DIAGNOSIS — R1013 Epigastric pain: Secondary | ICD-10-CM | POA: Diagnosis not present

## 2015-06-09 NOTE — Progress Notes (Signed)
  Subjective:    CC: follow-up  HPI: Nephrolithiasis: Left-sided, 2 cm stone burden in the left kidney, symptoms improved significantly with antibiotics, cultures were negative. She did see urology, they are awaiting CT scan before considering shockwave lithotripsy.  Epigastric pain: Present for a few weeks, it does sound as though urology recommended 30 mg of meloxicam daily. She will stop the meloxicam at this dose, and we are going to get a urease breath test when she comes back in a nurse visit, no hematemesis, melanotic, hematochezia.  She does have microcytosis  Microcytosis:  Negative anemia panel, negative hemoglobin electrophoresis. On further review of her medical record, she has been mildly ascitic over the past 6 years. She has also been thrombocytopenic.  Past medical history, Surgical history, Family history not pertinant except as noted below, Social history, Allergies, and medications have been entered into the medical record, reviewed, and no changes needed.   Review of Systems: No fevers, chills, night sweats, weight loss, chest pain, or shortness of breath.   Objective:    General: Well Developed, well nourished, and in no acute distress.  Neuro: Alert and oriented x3, extra-ocular muscles intact, sensation grossly intact.  HEENT: Normocephalic, atraumatic, pupils equal round reactive to light, neck supple, no masses, no lymphadenopathy, thyroid nonpalpable.  Skin: Warm and dry, no rashes. Cardiac: Regular rate and rhythm, no murmurs rubs or gallops, no lower extremity edema.  Respiratory: Clear to auscultation bilaterally. Not using accessory muscles, speaking in full sentences. Abdomen: Soft, nontender, nondistended, normal bowel sounds, no palpable masses, no guarding, no rigidity.   Impression and Recommendations:

## 2015-06-09 NOTE — Assessment & Plan Note (Signed)
Further follow-up with urology, pleasant shockwave lithotripsy versus ureteral stone retrieval. I'm going to check another urinalysis, and we will likely add another anti-biotic.

## 2015-06-09 NOTE — Assessment & Plan Note (Signed)
Anemia panel as well as hemoglobin electrophoresis were both normal. On further review of the medical record there was evidence of thrombocytopenia during pregnancy, as well as anemia without microcytosis. We are going to recheck a CBC with peripheral smear.

## 2015-06-09 NOTE — Assessment & Plan Note (Signed)
Patient has not taken any proton pump inhibitors, she will come back in a nursing visit to do the urease breath test.

## 2015-06-10 LAB — URINALYSIS
Bilirubin Urine: NEGATIVE
Glucose, UA: NEGATIVE
Ketones, ur: NEGATIVE
Leukocytes, UA: NEGATIVE
Nitrite: NEGATIVE
Protein, ur: NEGATIVE
Specific Gravity, Urine: 1.016 (ref 1.001–1.035)
pH: 7 (ref 5.0–8.0)

## 2015-06-10 LAB — PATHOLOGIST SMEAR REVIEW

## 2015-06-10 LAB — CBC
HCT: 38 % (ref 36.0–46.0)
Hemoglobin: 12.4 g/dL (ref 12.0–15.0)
MCH: 25.3 pg — ABNORMAL LOW (ref 26.0–34.0)
MCHC: 32.6 g/dL (ref 30.0–36.0)
MCV: 77.6 fL — ABNORMAL LOW (ref 78.0–100.0)
MPV: 10.2 fL (ref 8.6–12.4)
Platelets: 283 10*3/uL (ref 150–400)
RBC: 4.9 MIL/uL (ref 3.87–5.11)
RDW: 14.2 % (ref 11.5–15.5)
WBC: 8.5 10*3/uL (ref 4.0–10.5)

## 2015-06-10 LAB — URINE CULTURE
Colony Count: NO GROWTH
Organism ID, Bacteria: NO GROWTH

## 2015-06-15 ENCOUNTER — Telehealth: Payer: Self-pay

## 2015-06-15 NOTE — Telephone Encounter (Signed)
Patient is calling to find out if we have received her medical records for her former provider. Please advise.

## 2015-06-15 NOTE — Telephone Encounter (Signed)
CALLED PATIENT ADVISED HER THAT WE HAVE NOT RECEIVED ANY RECORDS AND PATIENT STATED THAT SHE HAD FILLED OUT THE RELEASE AT THE FORMER PROVIDER OFFICE TO RELEASE HER RECORDS TO NEW PCP, ADVISED PATIENT TO CONTACT THAT FORMER PROVIDER OFFICE OR TO FILL OUT A RELEASE FORM HERE AND IT WOULD BE SENT TO FORMER PCP OFFICE TO REQUEST THOSE RECORDS. Charlotte Nelson'

## 2015-06-23 ENCOUNTER — Ambulatory Visit (INDEPENDENT_AMBULATORY_CARE_PROVIDER_SITE_OTHER): Payer: 59 | Admitting: Sports Medicine

## 2015-06-23 VITALS — BP 124/80 | HR 80 | Wt 166.0 lb

## 2015-06-23 DIAGNOSIS — R1013 Epigastric pain: Secondary | ICD-10-CM

## 2015-06-23 NOTE — Assessment & Plan Note (Signed)
Urease breath test as above

## 2015-06-23 NOTE — Progress Notes (Signed)
Patient came into clinic today for H Pylori breath test. Patient does still complain of stomach pain, wonders if she may have an ulcer. Pt states the pain is about a 3/10, but more of a burning sensation than an actual pain. Pt wondered if this was associated with her diet, and then realized it didn't matter what she ate the pain was still there and she even wakes up with the pain now. Pain used to come and go, no it is pretty constant. Pt does also state she ran in a color run in [place] this past weekend, she was dehydrated prior to the race and even more so after. When the race was over she urinated bright red blood. Pt states she went to ED for treatment and is supposed to be scheduled for a Lithotripsy. Pt does have history of kidney stones.Patient was able to preform test without complications, samples were sent down to lab for review. Pt advised we will contact her with results and any change in plan of care. Verbalized understanding, no further questions.

## 2015-06-24 LAB — H. PYLORI BREATH TEST: H. pylori Breath Test: NOT DETECTED

## 2015-07-07 ENCOUNTER — Ambulatory Visit (INDEPENDENT_AMBULATORY_CARE_PROVIDER_SITE_OTHER): Payer: 59 | Admitting: Sports Medicine

## 2015-07-07 ENCOUNTER — Encounter: Payer: Self-pay | Admitting: Sports Medicine

## 2015-07-07 VITALS — BP 128/79 | HR 71 | Ht 64.0 in | Wt 166.0 lb

## 2015-07-07 DIAGNOSIS — G43809 Other migraine, not intractable, without status migrainosus: Secondary | ICD-10-CM

## 2015-07-07 DIAGNOSIS — E041 Nontoxic single thyroid nodule: Secondary | ICD-10-CM | POA: Diagnosis not present

## 2015-07-07 DIAGNOSIS — R1013 Epigastric pain: Secondary | ICD-10-CM

## 2015-07-07 DIAGNOSIS — R718 Other abnormality of red blood cells: Secondary | ICD-10-CM | POA: Diagnosis not present

## 2015-07-07 DIAGNOSIS — G43909 Migraine, unspecified, not intractable, without status migrainosus: Secondary | ICD-10-CM | POA: Insufficient documentation

## 2015-07-07 MED ORDER — NAPROXEN-ESOMEPRAZOLE 500-20 MG PO TBEC: 1.0000 | DELAYED_RELEASE_TABLET | Freq: Two times a day (BID) | ORAL | Status: DC

## 2015-07-07 MED ORDER — FERROUS SULFATE 325 (65 FE) MG PO TBEC: 325.0000 mg | DELAYED_RELEASE_TABLET | Freq: Three times a day (TID) | ORAL | Status: DC

## 2015-07-07 NOTE — Progress Notes (Signed)
  Subjective:    CC: Follow-up  HPI: Nephrolithiasis: Recently had an episode of hematuria, not much pain, and no longer has any flank pain now. She did not see any stones past.  Epigastric pain: Overall negative blood work, negative H. pylori breath test, epigastric pain has also surprisingly resolved.  Microcytosis: Anemia workup was negative with the exception of a low/normal iron saturation. Hemoglobin electrophoresis was negative. Peripheral smear pathologies read showed hyperchromic microcytic RBCs.  Thyroid mass: Patient has noted a nodule in the right side of her thyroid, would like me to further investigate.  Past medical history, Surgical history, Family history not pertinant except as noted below, Social history, Allergies, and medications have been entered into the medical record, reviewed, and no changes needed.   Review of Systems: No fevers, chills, night sweats, weight loss, chest pain, or shortness of breath.   Objective:    General: Well Developed, well nourished, and in no acute distress.  Neuro: Alert and oriented x3, extra-ocular muscles intact, sensation grossly intact.  HEENT: Normocephalic, atraumatic, pupils equal round reactive to light, neck supple, no masses, no lymphadenopathy, thyroid nonpalpable. There is a minimal operable thyroid goiter with a small nodule along the right isthmus. Skin: Warm and dry, no rashes. Cardiac: Regular rate and rhythm, no murmurs rubs or gallops, no lower extremity edema.  Respiratory: Clear to auscultation bilaterally. Not using accessory muscles, speaking in full sentences.  Impression and Recommendations:

## 2015-07-07 NOTE — Assessment & Plan Note (Signed)
Adding Vimovo.

## 2015-07-07 NOTE — Assessment & Plan Note (Signed)
Likely due to constant microscopic hematuria. Did have some epigastric pain. Checking hemoccults, and iron supplementation.

## 2015-07-07 NOTE — Assessment & Plan Note (Signed)
Improved, likely from NSAIDS and gastritis. Switching to Vimovo for pain.

## 2015-07-07 NOTE — Assessment & Plan Note (Signed)
Checking TFTs, thyroid ultrasound.

## 2015-07-08 ENCOUNTER — Ambulatory Visit (HOSPITAL_BASED_OUTPATIENT_CLINIC_OR_DEPARTMENT_OTHER)
Admission: RE | Admit: 2015-07-08 | Discharge: 2015-07-08 | Disposition: A | Discharge status: Home / Self Care | Payer: 59 | Source: Ambulatory Visit | Attending: Sports Medicine | Admitting: Sports Medicine

## 2015-07-08 DIAGNOSIS — E041 Nontoxic single thyroid nodule: Secondary | ICD-10-CM | POA: Diagnosis not present

## 2015-07-08 LAB — T3, FREE: T3, Free: 3.1 pg/mL (ref 2.3–4.2)

## 2015-07-08 LAB — TSH: TSH: 2.153 u[IU]/mL (ref 0.350–4.500)

## 2015-07-08 LAB — T4, FREE: Free T4: 0.92 ng/dL (ref 0.80–1.80)

## 2015-07-26 ENCOUNTER — Other Ambulatory Visit: Payer: Self-pay | Admitting: Urology

## 2015-08-01 ENCOUNTER — Ambulatory Visit (INDEPENDENT_AMBULATORY_CARE_PROVIDER_SITE_OTHER): Payer: 59 | Admitting: Sports Medicine

## 2015-08-01 ENCOUNTER — Encounter: Payer: Self-pay | Admitting: Sports Medicine

## 2015-08-01 VITALS — BP 118/84 | HR 104 | Temp 98.9°F | Wt 167.0 lb

## 2015-08-01 DIAGNOSIS — N200 Calculus of kidney: Secondary | ICD-10-CM

## 2015-08-01 DIAGNOSIS — J069 Acute upper respiratory infection, unspecified: Secondary | ICD-10-CM | POA: Insufficient documentation

## 2015-08-01 DIAGNOSIS — R1013 Epigastric pain: Secondary | ICD-10-CM | POA: Diagnosis not present

## 2015-08-01 DIAGNOSIS — J029 Acute pharyngitis, unspecified: Secondary | ICD-10-CM | POA: Diagnosis not present

## 2015-08-01 LAB — POCT RAPID STREP A (OFFICE): Rapid Strep A Screen: NEGATIVE

## 2015-08-01 MED ORDER — OMEPRAZOLE 40 MG PO CPDR: 40.0000 mg | DELAYED_RELEASE_CAPSULE | Freq: Every day | ORAL | Status: DC

## 2015-08-01 MED ORDER — MAGIC MOUTHWASH W/LIDOCAINE: 15.0000 mL | Freq: Four times a day (QID) | ORAL | Status: DC | PRN

## 2015-08-01 NOTE — Patient Instructions (Signed)
Upper Respiratory Infection, Adult Most upper respiratory infections (URIs) are a viral infection of the air passages leading to the lungs. A URI affects the nose, throat, and upper air passages. The most common type of URI is nasopharyngitis and is typically referred to as "the common cold." URIs run their course and usually go away on their own. Most of the time, a URI does not require medical attention, but sometimes a bacterial infection in the upper airways can follow a viral infection. This is called a secondary infection. Sinus and middle ear infections are common types of secondary upper respiratory infections. Bacterial pneumonia can also complicate a URI. A URI can worsen asthma and chronic obstructive pulmonary disease (COPD). Sometimes, these complications can require emergency medical care and may be life threatening.  CAUSES Almost all URIs are caused by viruses. A virus is a type of germ and can spread from one person to another.  RISKS FACTORS You may be at risk for a URI if:   You smoke.   You have chronic heart or lung disease.  You have a weakened defense (immune) system.   You are very young or very old.   You have nasal allergies or asthma.  You work in crowded or poorly ventilated areas.  You work in health care facilities or schools. SIGNS AND SYMPTOMS  Symptoms typically develop 2-3 days after you come in contact with a cold virus. Most viral URIs last 7-10 days. However, viral URIs from the influenza virus (flu virus) can last 14-18 days and are typically more severe. Symptoms may include:   Runny or stuffy (congested) nose.   Sneezing.   Cough.   Sore throat.   Headache.   Fatigue.   Fever.   Loss of appetite.   Pain in your forehead, behind your eyes, and over your cheekbones (sinus pain).  Muscle aches.  DIAGNOSIS  Your health care provider may diagnose a URI by:  Physical exam.  Tests to check that your symptoms are not due to  another condition such as:  Strep throat.  Sinusitis.  Pneumonia.  Asthma. TREATMENT  A URI goes away on its own with time. It cannot be cured with medicines, but medicines may be prescribed or recommended to relieve symptoms. Medicines may help:  Reduce your fever.  Reduce your cough.  Relieve nasal congestion. HOME CARE INSTRUCTIONS   Take medicines only as directed by your health care provider.   Gargle warm saltwater or take cough drops to comfort your throat as directed by your health care provider.  Use a warm mist humidifier or inhale steam from a shower to increase air moisture. This may make it easier to breathe.  Drink enough fluid to keep your urine clear or pale yellow.   Eat soups and other clear broths and maintain good nutrition.   Rest as needed.   Return to work when your temperature has returned to normal or as your health care provider advises. You may need to stay home longer to avoid infecting others. You can also use a face mask and careful hand washing to prevent spread of the virus.  Increase the usage of your inhaler if you have asthma.   Do not use any tobacco products, including cigarettes, chewing tobacco, or electronic cigarettes. If you need help quitting, ask your health care provider. PREVENTION  The best way to protect yourself from getting a cold is to practice good hygiene.   Avoid oral or hand contact with people with cold   symptoms.   Wash your hands often if contact occurs.  There is no clear evidence that vitamin C, vitamin E, echinacea, or exercise reduces the chance of developing a cold. However, it is always recommended to get plenty of rest, exercise, and practice good nutrition.  SEEK MEDICAL CARE IF:   You are getting worse rather than better.   Your symptoms are not controlled by medicine.   You have chills.  You have worsening shortness of breath.  You have brown or red mucus.  You have yellow or brown nasal  discharge.  You have pain in your face, especially when you bend forward.  You have a fever.  You have swollen neck glands.  You have pain while swallowing.  You have white areas in the back of your throat. SEEK IMMEDIATE MEDICAL CARE IF:   You have severe or persistent:  Headache.  Ear pain.  Sinus pain.  Chest pain.  You have chronic lung disease and any of the following:  Wheezing.  Prolonged cough.  Coughing up blood.  A change in your usual mucus.  You have a stiff neck.  You have changes in your:  Vision.  Hearing.  Thinking.  Mood. MAKE SURE YOU:   Understand these instructions.  Will watch your condition.  Will get help right away if you are not doing well or get worse.   This information is not intended to replace advice given to you by your health care provider. Make sure you discuss any questions you have with your health care provider.   Document Released: 04/03/2001 Document Revised: 02/22/2015 Document Reviewed: 01/13/2014 Elsevier Interactive Patient Education 2016 [employer]  

## 2015-08-01 NOTE — Assessment & Plan Note (Signed)
Adding Prilosec.

## 2015-08-01 NOTE — Assessment & Plan Note (Signed)
Main symptom is sore throat, negative strep, adding Magic mouthwash, continue Vimovo for pain. Treatment will remain symptomatic.

## 2015-08-01 NOTE — Assessment & Plan Note (Signed)
Lithotripsy coming up.

## 2015-08-01 NOTE — Progress Notes (Signed)
  Subjective:    CC: sore throat  HPI: This is a pleasant 34 year old female, she comes in with a several-day history of sore throat, no cough, minimal fevers. No chest pain, shortness of breath, nausea, vomiting, diarrhea, no abdominal pain.  Nephrolithiasis: Initially percutaneous nephrostomy was planned however they're going to go ahead with the lithotripsy first.  Dyspepsia: Improved significantly with discontinuation of NSAIDs, she did go back to taking ibuprofen and has really not been using the the Vimovo, amenable to trying oral PPI.  Past medical history, Surgical history, Family history not pertinant except as noted below, Social history, Allergies, and medications have been entered into the medical record, reviewed, and no changes needed.   Review of Systems: No fevers, chills, night sweats, weight loss, chest pain, or shortness of breath.   Objective:    General: Well Developed, well nourished, and in no acute distress.  Neuro: Alert and oriented x3, extra-ocular muscles intact, sensation grossly intact.  HEENT: Normocephalic, atraumatic, pupils equal round reactive to light, neck supple, no masses, no lymphadenopathy, thyroid nonpalpable. Oropharynx, nasopharynx, ear canals are unremarkable with the exception of only minimal pharyngeal erythema. There is also only minimal turbinate hyperemia. Skin: Warm and dry, no rashes. Cardiac: Regular rate and rhythm, no murmurs rubs or gallops, no lower extremity edema.  Respiratory: Clear to auscultation bilaterally. Not using accessory muscles, speaking in full sentences.  Impression and Recommendations:    Rapid strep test is negative

## 2015-08-02 MED ORDER — OMEPRAZOLE 40 MG PO CPDR: 40.0000 mg | DELAYED_RELEASE_CAPSULE | Freq: Every day | ORAL | Status: DC

## 2015-08-04 ENCOUNTER — Ambulatory Visit: Payer: 59 | Admitting: Sports Medicine

## 2015-08-05 ENCOUNTER — Other Ambulatory Visit: Payer: Self-pay | Admitting: Sports Medicine

## 2015-08-05 DIAGNOSIS — R718 Other abnormality of red blood cells: Secondary | ICD-10-CM

## 2015-08-05 MED ORDER — FERROUS SULFATE 325 (65 FE) MG PO TBEC: 325.0000 mg | DELAYED_RELEASE_TABLET | Freq: Three times a day (TID) | ORAL | Status: DC

## 2015-08-10 ENCOUNTER — Ambulatory Visit (INDEPENDENT_AMBULATORY_CARE_PROVIDER_SITE_OTHER): Payer: 59 | Admitting: Sports Medicine

## 2015-08-10 VITALS — BP 126/88 | HR 85 | Temp 97.8°F | Wt 164.0 lb

## 2015-08-10 DIAGNOSIS — R300 Dysuria: Secondary | ICD-10-CM | POA: Diagnosis not present

## 2015-08-10 DIAGNOSIS — R319 Hematuria, unspecified: Secondary | ICD-10-CM

## 2015-08-10 DIAGNOSIS — N200 Calculus of kidney: Secondary | ICD-10-CM | POA: Diagnosis not present

## 2015-08-10 LAB — POCT URINALYSIS DIPSTICK
Bilirubin, UA: NEGATIVE
Glucose, UA: NEGATIVE
Ketones, UA: NEGATIVE
Nitrite, UA: NEGATIVE
Protein, UA: 30
Spec Grav, UA: >=1.03
Urobilinogen, UA: 0.2
pH, UA: 6

## 2015-08-10 MED ORDER — RANITIDINE HCL 300 MG PO TABS: 300.0000 mg | ORAL_TABLET | Freq: Two times a day (BID) | ORAL | Status: DC

## 2015-08-10 MED ORDER — CIPROFLOXACIN HCL 750 MG PO TABS: 750.0000 mg | ORAL_TABLET | Freq: Two times a day (BID) | ORAL | Status: DC

## 2015-08-10 NOTE — Progress Notes (Signed)
  Subjective:    CC: Left flank pain  HPI: This is a pleasant 34 year old female with a history of severe nephrolithiasis, for the past several days she's had increasing left flank pain, no constitutional symptoms. She is scheduled for lithotripsy in December.  Past medical history, Surgical history, Family history not pertinant except as noted below, Social history, Allergies, and medications have been entered into the medical record, reviewed, and no changes needed.   Review of Systems: No fevers, chills, night sweats, weight loss, chest pain, or shortness of breath.   Objective:    General: Well Developed, well nourished, and in no acute distress.  Neuro: Alert and oriented x3, extra-ocular muscles intact, sensation grossly intact.  HEENT: Normocephalic, atraumatic, pupils equal round reactive to light, neck supple, no masses, no lymphadenopathy, thyroid nonpalpable.  Skin: Warm and dry, no rashes. Cardiac: Regular rate and rhythm, no murmurs rubs or gallops, no lower extremity edema.  Respiratory: Clear to auscultation bilaterally. Not using accessory muscles, speaking in full sentences. Abdomen: Soft, nontender, nondistended, normal bowel sounds, no palpable masses, severe left costovertebral angle pain. No guarding, no rebound tenderness, no rigidity.  Urinalysis is positive for blood, and leukocytes and will be sent off for culture.  Impression and Recommendations:

## 2015-08-10 NOTE — Progress Notes (Signed)
Patient came into clinic today stating she was having back pain, some hematuria, dysuria, and has a known kidney stones at this time. Patient reports no difficulty urinating and was able to provide a urine sample in office today. This urine was tested with POCT urine dipstick and sent for culture. Patient was able to see PCP in room and go over urine dipstick results. An antibiotic will be called in. Pt was advised to take the pain medication (oxycodone) prescribed by her urologist for her pain associated with kidney stones. Patient will also be sent for a CT, advised we would be able to do that in the morning to help accomodate her busy schedule. Verbalized understanding.

## 2015-08-10 NOTE — Assessment & Plan Note (Signed)
Acute pain, urinalysis is positive, obtaining urine culture, Cipro for 14 days, CT of the abdomen and pelvis no contrast. She will fill the pain medication rx ed by her Urologist

## 2015-08-11 ENCOUNTER — Ambulatory Visit (INDEPENDENT_AMBULATORY_CARE_PROVIDER_SITE_OTHER): Payer: 59

## 2015-08-11 DIAGNOSIS — N200 Calculus of kidney: Secondary | ICD-10-CM | POA: Diagnosis not present

## 2015-08-12 LAB — URINE CULTURE: Colony Count: 50000

## 2015-09-02 ENCOUNTER — Ambulatory Visit (INDEPENDENT_AMBULATORY_CARE_PROVIDER_SITE_OTHER): Payer: 59 | Admitting: Sports Medicine

## 2015-09-02 ENCOUNTER — Encounter: Payer: Self-pay | Admitting: Sports Medicine

## 2015-09-02 VITALS — BP 138/89 | HR 104 | Temp 98.0°F | Wt 163.0 lb

## 2015-09-02 DIAGNOSIS — Z23 Encounter for immunization: Secondary | ICD-10-CM | POA: Diagnosis not present

## 2015-09-02 DIAGNOSIS — N200 Calculus of kidney: Secondary | ICD-10-CM

## 2015-09-02 DIAGNOSIS — R0982 Postnasal drip: Secondary | ICD-10-CM | POA: Insufficient documentation

## 2015-09-02 DIAGNOSIS — M549 Dorsalgia, unspecified: Secondary | ICD-10-CM

## 2015-09-02 LAB — POCT URINALYSIS DIPSTICK
Bilirubin, UA: NEGATIVE
Glucose, UA: NEGATIVE
Ketones, UA: NEGATIVE
Leukocytes, UA: NEGATIVE
Nitrite, UA: NEGATIVE
Protein, UA: 30
Spec Grav, UA: 1.025
Urobilinogen, UA: 0.2
pH, UA: 6

## 2015-09-02 MED ORDER — AZELASTINE-FLUTICASONE 137-50 MCG/ACT NA SUSP: NASAL | Status: DC

## 2015-09-02 MED ORDER — CIPROFLOXACIN HCL 750 MG PO TABS: 750.0000 mg | ORAL_TABLET | Freq: Two times a day (BID) | ORAL | Status: DC

## 2015-09-02 NOTE — Assessment & Plan Note (Signed)
Adding Dymista. 

## 2015-09-02 NOTE — Progress Notes (Signed)
  Subjective:    CC: Flank pain  HPI: This is a pleasant 34 year old female with severe nephrolithiasis with a high stone burden, scheduled for lithotripsy at the beginning of December. Last month we treated her with high-dose ciprofloxacin, and her pain went away immediately, unfortunately she is having low-level mild pain in the left flank, no constitutional symptoms and no dysuria.  Past medical history, Surgical history, Family history not pertinant except as noted below, Social history, Allergies, and medications have been entered into the medical record, reviewed, and no changes needed.   Review of Systems: No fevers, chills, night sweats, weight loss, chest pain, or shortness of breath.   Objective:    General: Well Developed, well nourished, and in no acute distress.  Neuro: Alert and oriented x3, extra-ocular muscles intact, sensation grossly intact.  HEENT: Normocephalic, atraumatic, pupils equal round reactive to light, neck supple, no masses, no lymphadenopathy, thyroid nonpalpable.  Skin: Warm and dry, no rashes. Cardiac: Regular rate and rhythm, no murmurs rubs or gallops, no lower extremity edema.  Respiratory: Clear to auscultation bilaterally. Not using accessory muscles, speaking in full sentences. Abdomen: Soft, nontender, nondistended, normal bowel sounds, no palpable masses, only mild tenderness at the left costovertebral angle with a negative Lloyd's punch.  Urinalysis is positive for blood.  Impression and Recommendations:

## 2015-09-02 NOTE — Assessment & Plan Note (Signed)
Persistent never lithiasis with a high stone burden with lithotripsy scheduled for December 1, fantastic improvement with ciprofloxacin over the last month however still has mild her system pain. We are going to continue ciprofloxacin until her lithotripsy, patient advised to avoid exercise and weightbearing activities of possible due to risk of tendon rupture.

## 2015-09-02 NOTE — Addendum Note (Signed)
Addended by: Huel Cote on: 09/02/2015 11:44 AM   Modules accepted: Orders

## 2015-09-04 LAB — URINE CULTURE
Colony Count: NO GROWTH
Organism ID, Bacteria: NO GROWTH

## 2015-09-12 ENCOUNTER — Other Ambulatory Visit: Payer: Self-pay | Admitting: Sports Medicine

## 2015-09-12 DIAGNOSIS — R0982 Postnasal drip: Secondary | ICD-10-CM

## 2015-09-12 MED ORDER — TRIAMCINOLONE ACETONIDE 55 MCG/ACT NA AERO: 2.0000 | INHALATION_SPRAY | Freq: Every day | NASAL | Status: DC

## 2015-09-12 NOTE — Assessment & Plan Note (Signed)
Switching to Nasacort.

## 2015-09-13 ENCOUNTER — Encounter (HOSPITAL_COMMUNITY): Payer: Self-pay

## 2015-09-13 ENCOUNTER — Encounter (HOSPITAL_COMMUNITY)
Admission: RE | Admit: 2015-09-13 | Discharge: 2015-09-13 | Disposition: A | Discharge status: Home / Self Care | Payer: 59 | Source: Ambulatory Visit | Attending: Urology | Admitting: Urology

## 2015-09-13 DIAGNOSIS — Z01818 Encounter for other preprocedural examination: Secondary | ICD-10-CM | POA: Diagnosis present

## 2015-09-13 DIAGNOSIS — N200 Calculus of kidney: Secondary | ICD-10-CM | POA: Diagnosis not present

## 2015-09-13 HISTORY — DX: Gastritis, unspecified, without bleeding: K29.70

## 2015-09-13 HISTORY — DX: Calculus of kidney: N20.0

## 2015-09-13 HISTORY — DX: Family history of other specified conditions: Z84.89

## 2015-09-13 HISTORY — DX: Tremor, unspecified: R25.1

## 2015-09-13 LAB — CBC
HEMATOCRIT: 41.1 % (ref 36.0–46.0)
HEMOGLOBIN: 13.2 g/dL (ref 12.0–15.0)
MCH: 25.6 pg — ABNORMAL LOW (ref 26.0–34.0)
MCHC: 32.1 g/dL (ref 30.0–36.0)
MCV: 79.8 fL (ref 78.0–100.0)
Platelets: 314 10*3/uL (ref 150–400)
RBC: 5.15 MIL/uL — AB (ref 3.87–5.11)
RDW: 13.8 % (ref 11.5–15.5)
WBC: 8.1 10*3/uL (ref 4.0–10.5)

## 2015-09-13 LAB — BASIC METABOLIC PANEL
ANION GAP: 6 (ref 5–15)
BUN: 16 mg/dL (ref 6–20)
CHLORIDE: 108 mmol/L (ref 101–111)
CO2: 26 mmol/L (ref 22–32)
CREATININE: 0.92 mg/dL (ref 0.44–1.00)
Calcium: 9.1 mg/dL (ref 8.9–10.3)
GFR calc non Af Amer: >60 mL/min (ref >60)
Glucose, Bld: 111 mg/dL — ABNORMAL HIGH (ref 65–99)
POTASSIUM: 4.2 mmol/L (ref 3.5–5.1)
Sodium: 140 mmol/L (ref 135–145)

## 2015-09-13 LAB — HCG, SERUM, QUALITATIVE: Preg, Serum: NEGATIVE

## 2015-09-13 NOTE — Patient Instructions (Addendum)
YOUR PROCEDURE IS SCHEDULED ON : 09/22/15  REPORT TO  HOSPITAL MAIN ENTRANCE FOLLOW SIGNS TO EAST ELEVATOR - GO TO 3rd FLOOR CHECK IN AT 3 EAST NURSES STATION (SHORT STAY) AT: 5:30 AM  CALL THIS NUMBER IF YOU HAVE PROBLEMS THE MORNING OF SURGERY [phone]  REMEMBER:ONLY 1 PER PERSON MAY GO TO SHORT STAY WITH YOU TO GET READY THE MORNING OF YOUR SURGERY  DO NOT EAT FOOD OR DRINK LIQUIDS AFTER MIDNIGHT  TAKE THESE MEDICINES THE MORNING OF SURGERY: TRINTELLIX / PROPRANOLOL / CIPRO  YOU MAY NOT HAVE ANY METAL ON YOUR BODY INCLUDING HAIR PINS AND PIERCING'S. DO NOT WEAR JEWELRY, MAKEUP, LOTIONS, POWDERS OR PERFUMES. DO NOT WEAR NAIL POLISH. DO NOT SHAVE 48 HRS PRIOR TO SURGERY. MEN MAY SHAVE FACE AND NECK.  DO NOT [place]. [facility] IS NOT RESPONSIBLE FOR VALUABLES.  CONTACTS, DENTURES OR PARTIALS MAY NOT BE WORN TO SURGERY. LEAVE SUITCASE IN CAR. CAN BE BROUGHT TO ROOM AFTER SURGERY.  PATIENTS DISCHARGED THE DAY OF SURGERY WILL NOT BE ALLOWED TO DRIVE HOME.  PLEASE READ OVER THE FOLLOWING INSTRUCTION SHEETS _________________________________________________________________________________                                          [facility] - PREPARING FOR SURGERY  Before surgery, you can play an important role.  Because skin is not sterile, your skin needs to be as free of germs as possible.  You can reduce the number of germs on your skin by washing with CHG (chlorahexidine gluconate) soap before surgery.  CHG is an antiseptic cleaner which kills germs and bonds with the skin to continue killing germs even after washing. Please DO NOT use if you have an allergy to CHG or antibacterial soaps.  If your skin becomes reddened/irritated stop using the CHG and inform your nurse when you arrive at Short Stay. Do not shave (including legs and underarms) for at least 48 hours prior to the first CHG shower.  You may shave your face. Please follow these  instructions carefully:   1.  Shower with CHG Soap the night before surgery and the  morning of Surgery.   2.  If you choose to wash your hair, wash your hair first as usual with your  normal  Shampoo.   3.  After you shampoo, rinse your hair and body thoroughly to remove the  shampoo.                                         4.  Use CHG as you would any other liquid soap.  You can apply chg directly  to the skin and wash . Gently wash with scrungie or clean wascloth    5.  Apply the CHG Soap to your body ONLY FROM THE NECK DOWN.   Do not use on open                           Wound or open sores. Avoid contact with eyes, ears mouth and genitals (private parts).                        Genitals (private parts) with your normal soap.  6.  Wash thoroughly, paying special attention to the area where your surgery  will be performed.   7.  Thoroughly rinse your body with warm water from the neck down.   8.  DO NOT shower/wash with your normal soap after using and rinsing off  the CHG Soap .                9.  Pat yourself dry with a clean towel.             10.  Wear clean night clothes to bed after shower             11.  Place clean sheets on your bed the night of your first shower and do not  sleep with pets.  Day of Surgery : Do not apply any lotions/deodorants the morning of surgery.  Please wear clean clothes to the hospital/surgery center.  FAILURE TO FOLLOW THESE INSTRUCTIONS MAY RESULT IN THE CANCELLATION OF YOUR SURGERY    PATIENT SIGNATURE_________________________________  ______________________________________________________________________

## 2015-09-22 ENCOUNTER — Encounter (HOSPITAL_COMMUNITY): Payer: Self-pay | Admitting: Anesthesiology

## 2015-09-22 ENCOUNTER — Ambulatory Visit (HOSPITAL_COMMUNITY): Payer: 59 | Admitting: Certified Registered Nurse Anesthetist

## 2015-09-22 ENCOUNTER — Encounter (HOSPITAL_COMMUNITY)
Admission: RE | Disposition: A | Discharge status: Home / Self Care | Payer: Self-pay | Source: Ambulatory Visit | Attending: Urology

## 2015-09-22 ENCOUNTER — Ambulatory Visit (HOSPITAL_COMMUNITY)
Admission: RE | Admit: 2015-09-22 | Discharge: 2015-09-22 | Disposition: A | Discharge status: Home / Self Care | Payer: 59 | Source: Ambulatory Visit | Attending: Urology | Admitting: Urology

## 2015-09-22 ENCOUNTER — Ambulatory Visit (HOSPITAL_COMMUNITY): Payer: 59

## 2015-09-22 DIAGNOSIS — Z01818 Encounter for other preprocedural examination: Secondary | ICD-10-CM

## 2015-09-22 DIAGNOSIS — N200 Calculus of kidney: Secondary | ICD-10-CM | POA: Diagnosis present

## 2015-09-22 DIAGNOSIS — R3129 Other microscopic hematuria: Secondary | ICD-10-CM | POA: Diagnosis present

## 2015-09-22 DIAGNOSIS — F419 Anxiety disorder, unspecified: Secondary | ICD-10-CM | POA: Insufficient documentation

## 2015-09-22 DIAGNOSIS — Z8719 Personal history of other diseases of the digestive system: Secondary | ICD-10-CM | POA: Diagnosis not present

## 2015-09-22 HISTORY — PX: CYSTOSCOPY W/ URETERAL STENT PLACEMENT: SHX1429

## 2015-09-22 LAB — PREGNANCY, URINE: Preg Test, Ur: NEGATIVE

## 2015-09-22 SURGERY — CYSTOSCOPY, WITH RETROGRADE PYELOGRAM AND URETERAL STENT INSERTION
Anesthesia: General | Site: Ureter | Laterality: Left

## 2015-09-22 SURGERY — LITHOTRIPSY, ESWL
Anesthesia: LOCAL | Laterality: Left

## 2015-09-22 MED ORDER — ONDANSETRON HCL 4 MG/2ML IJ SOLN: 4.0000 mg | Freq: Once | INTRAMUSCULAR | Status: DC

## 2015-09-22 MED ORDER — DEXAMETHASONE SODIUM PHOSPHATE 10 MG/ML IJ SOLN
INTRAMUSCULAR | Status: DC | PRN
  Administered 2015-09-22: 10 mg via INTRAVENOUS

## 2015-09-22 MED ORDER — TRAMADOL-ACETAMINOPHEN 37.5-325 MG PO TABS: 1.0000 | ORAL_TABLET | Freq: Four times a day (QID) | ORAL | Status: DC | PRN

## 2015-09-22 MED ORDER — PHENYLEPHRINE HCL 10 MG/ML IJ SOLN
INTRAMUSCULAR | Status: DC | PRN
  Administered 2015-09-22 (×2): 80 ug via INTRAVENOUS
  Administered 2015-09-22 (×2): 40 ug via INTRAVENOUS
  Administered 2015-09-22: 80 ug via INTRAVENOUS

## 2015-09-22 MED ORDER — LACTATED RINGERS IV SOLN
INTRAVENOUS | Status: DC | PRN
  Administered 2015-09-22 (×2): via INTRAVENOUS

## 2015-09-22 MED ORDER — MIDAZOLAM HCL 2 MG/2ML IJ SOLN
INTRAMUSCULAR | Status: AC
  Filled 2015-09-22: qty 2

## 2015-09-22 MED ORDER — SODIUM CHLORIDE 0.9 % IR SOLN
Status: DC | PRN
  Administered 2015-09-22: 3000 mL

## 2015-09-22 MED ORDER — DEXAMETHASONE SODIUM PHOSPHATE 10 MG/ML IJ SOLN
INTRAMUSCULAR | Status: AC
  Filled 2015-09-22: qty 1

## 2015-09-22 MED ORDER — FENTANYL CITRATE (PF) 100 MCG/2ML IJ SOLN
INTRAMUSCULAR | Status: AC
  Filled 2015-09-22: qty 2

## 2015-09-22 MED ORDER — MIDAZOLAM HCL 5 MG/5ML IJ SOLN
INTRAMUSCULAR | Status: DC | PRN
  Administered 2015-09-22: 2 mg via INTRAVENOUS

## 2015-09-22 MED ORDER — PHENYLEPHRINE HCL 10 MG/ML IJ SOLN
INTRAMUSCULAR | Status: AC
  Filled 2015-09-22: qty 1

## 2015-09-22 MED ORDER — CEFAZOLIN SODIUM-DEXTROSE 2-3 GM-% IV SOLR
INTRAVENOUS | Status: AC
  Filled 2015-09-22: qty 50

## 2015-09-22 MED ORDER — FENTANYL CITRATE (PF) 100 MCG/2ML IJ SOLN
INTRAMUSCULAR | Status: DC | PRN
  Administered 2015-09-22 (×2): 50 ug via INTRAVENOUS

## 2015-09-22 MED ORDER — PROPOFOL 10 MG/ML IV BOLUS
INTRAVENOUS | Status: AC
  Filled 2015-09-22: qty 40

## 2015-09-22 MED ORDER — FENTANYL CITRATE (PF) 100 MCG/2ML IJ SOLN
25.0000 ug | INTRAMUSCULAR | Status: DC | PRN
  Administered 2015-09-22: 50 ug via INTRAVENOUS

## 2015-09-22 MED ORDER — ACETAMINOPHEN 10 MG/ML IV SOLN
INTRAVENOUS | Status: AC
  Filled 2015-09-22: qty 100

## 2015-09-22 MED ORDER — OXYCODONE-ACETAMINOPHEN 5-325 MG PO TABS
1.0000 | ORAL_TABLET | ORAL | Status: DC | PRN
  Administered 2015-09-22: 1 via ORAL
  Filled 2015-09-22: qty 1

## 2015-09-22 MED ORDER — DIAZEPAM 5 MG PO TABS: 10.0000 mg | ORAL_TABLET | ORAL | Status: DC

## 2015-09-22 MED ORDER — DIPHENHYDRAMINE HCL 25 MG PO CAPS: 25.0000 mg | ORAL_CAPSULE | ORAL | Status: DC

## 2015-09-22 MED ORDER — PROMETHAZINE HCL 25 MG/ML IJ SOLN
6.2500 mg | INTRAMUSCULAR | Status: DC | PRN
Start: 2015-09-22 — End: 2015-09-22

## 2015-09-22 MED ORDER — ONDANSETRON HCL 4 MG/2ML IJ SOLN
INTRAMUSCULAR | Status: AC
  Filled 2015-09-22: qty 2

## 2015-09-22 MED ORDER — HYOSCYAMINE SULFATE 0.125 MG SL SUBL: 0.1250 mg | SUBLINGUAL_TABLET | SUBLINGUAL | Status: DC | PRN

## 2015-09-22 MED ORDER — UROGESIC-BLUE 81.6 MG PO TABS: ORAL_TABLET | ORAL | Status: DC

## 2015-09-22 MED ORDER — ONDANSETRON HCL 4 MG/2ML IJ SOLN
INTRAMUSCULAR | Status: DC | PRN
  Administered 2015-09-22: 4 mg via INTRAVENOUS

## 2015-09-22 MED ORDER — METOCLOPRAMIDE HCL 5 MG/ML IJ SOLN
INTRAMUSCULAR | Status: DC | PRN
  Administered 2015-09-22: 10 mg via INTRAVENOUS

## 2015-09-22 MED ORDER — LIDOCAINE HCL (CARDIAC) 20 MG/ML IV SOLN
INTRAVENOUS | Status: AC
  Filled 2015-09-22: qty 5

## 2015-09-22 MED ORDER — LIDOCAINE HCL (CARDIAC) 20 MG/ML IV SOLN
INTRAVENOUS | Status: DC | PRN
  Administered 2015-09-22: 100 mg via INTRAVENOUS

## 2015-09-22 MED ORDER — SODIUM CHLORIDE 0.9 % IV SOLN
INTRAVENOUS | Status: DC | PRN
  Administered 2015-09-22: 3 mL

## 2015-09-22 MED ORDER — CEFAZOLIN SODIUM-DEXTROSE 2-3 GM-% IV SOLR
2.0000 g | INTRAVENOUS | Status: AC
  Administered 2015-09-22: 2 g via INTRAVENOUS

## 2015-09-22 MED ORDER — CIPROFLOXACIN HCL 500 MG PO TABS: 500.0000 mg | ORAL_TABLET | ORAL | Status: DC

## 2015-09-22 MED ORDER — HYDROMORPHONE HCL 1 MG/ML IJ SOLN
0.5000 mg | INTRAMUSCULAR | Status: DC | PRN
  Administered 2015-09-22: 0.5 mg via INTRAVENOUS
  Filled 2015-09-22: qty 1

## 2015-09-22 MED ORDER — PROPOFOL 10 MG/ML IV BOLUS
INTRAVENOUS | Status: DC | PRN
  Administered 2015-09-22: 180 mg via INTRAVENOUS

## 2015-09-22 MED ORDER — METOCLOPRAMIDE HCL 5 MG/ML IJ SOLN
INTRAMUSCULAR | Status: AC
  Filled 2015-09-22: qty 2

## 2015-09-22 SURGICAL SUPPLY — 14 items
BAG URO CATCHER STRL LF (DRAPE) ×3 IMPLANT
CATH INTERMIT  6FR 70CM (CATHETERS) ×3 IMPLANT
CLOTH BEACON ORANGE TIMEOUT ST (SAFETY) ×3 IMPLANT
GLOVE BIOGEL M STRL SZ7.5 (GLOVE) ×3 IMPLANT
GOWN STRL REUS W/TWL LRG LVL3 (GOWN DISPOSABLE) ×3 IMPLANT
GOWN STRL REUS W/TWL XL LVL3 (GOWN DISPOSABLE) ×3 IMPLANT
GUIDEWIRE STR DUAL SENSOR (WIRE) ×3 IMPLANT
MANIFOLD NEPTUNE II (INSTRUMENTS) ×3 IMPLANT
NS IRRIG 1000ML POUR BTL (IV SOLUTION) ×3 IMPLANT
PACK CYSTO (CUSTOM PROCEDURE TRAY) ×3 IMPLANT
SCRUB PCMX 4 OZ (MISCELLANEOUS) IMPLANT
STENT URET 6FRX24 CONTOUR (STENTS) ×3 IMPLANT
TUBING CONNECTING 10 (TUBING) ×2 IMPLANT
TUBING CONNECTING 10' (TUBING) ×1

## 2015-09-22 NOTE — Anesthesia Postprocedure Evaluation (Signed)
Anesthesia Post Note  Patient: Charlotte Nelson  Procedure(s) Performed: Procedure(s) (LRB): CYSTOSCOPY WITH LEFT  RETROGRADE PYELOGRAM/ STENT PLACEMENT (Left)  Patient location during evaluation: PACU Anesthesia Type: General Level of consciousness: awake and alert Pain management: pain level controlled Vital Signs Assessment: post-procedure vital signs reviewed and stable Respiratory status: spontaneous breathing, nonlabored ventilation, respiratory function stable and patient connected to nasal cannula oxygen Cardiovascular status: blood pressure returned to baseline and stable Postop Assessment: no signs of nausea or vomiting Anesthetic complications: no    Last Vitals:  Filed Vitals:   09/22/15 1240 09/22/15 1440  BP: 138/93 122/85  Pulse: 63 84  Temp: 36.4 C 36.9 C  Resp: 16 18    Last Pain:  Filed Vitals:   09/22/15 1621  PainSc: 1                  Kashmir Lysaght J

## 2015-09-22 NOTE — H&P (Signed)
Reason For Visit 2 mo f/u   Active Problems Problems  1. Microscopic hematuria (R31.2) 2. Nephrolithiasis (N20.0)  History of Present Illness 34 yo returns today for a 2 mo f/u. She has an extensive family history of nephrolithiasis who we are seeing in consultation for LEFT flank pain at the request of Dr. Dianah Field. She was recently seen in the [facility] ER with worsening LEFT flank pain. She reports a CT scan was done that demonstrated bilateral nephrolithiasis (she states she was told 1.2 x 0.6 cm lower pole intra-renal stone on the LEFT as well as smaller stones on the right with no evidence of obstruction bilaterally, however we do not have either these images to review or a report of these images). She was been experiencing intermittent LEFT flank pain over the last 10 weeks without any inciting event/trauma. No aggravating or alleviating factors. She has had some mild benefit with a muscle relaxant she does not know the name of as well as oxycodone while she was in the ED, but she was not discharge home with anything for pain. She was started on flomax by her PCP which has been unhelpful. She states that Ibuprofen "does not touch her pain". She has had no associated nausea, emesis or voiding symptoms. She was told she had a urinary tract infection by her PCP several days ago and was started on an antibiotic which she completed. She is unsure is her culture was positive but was asymptomatic. She denies fevers or chills.     She has no prior urologic history including infections and prior stone episodes. She denies history of bowel surgery, calcium supplementation, Topiramate use.   Past Medical History Problems  1. History of Anxiety (F41.9) 2. History of esophageal reflux (Z87.19)  Surgical History Problems  1. History of Oral Surgery  Current Meds 1. Ortho Tri-Cyclen (21) TABS;  Therapy: (Recorded:29Jul2016) to Recorded 2. Probiotic CAPS;  Therapy: (Recorded:29Jul2016) to  Recorded 3. Propranolol HCl - 20 MG Oral Tablet;  Therapy: (Recorded:29Jul2016) to Recorded 4. Tamsulosin HCl 0.4 MG CP24;  Therapy: (Recorded:19Sep2016) to Recorded 5. Wellbutrin XL 300 MG Oral Tablet Extended Release 24 Hour;  Therapy: (Recorded:19Sep2016) to Recorded  Allergies Medication  1. CeleXA TABS  Family History Problems  1. Family history of diabetes mellitus (Z83.3) : Mother 2. Family history of hypothyroidism (Z83.49) : Mother 3. Family history of kidney stones (Z84.1) : Father 4. Family history of prostate cancer (Z80.42) : Father  Social History Problems  1. Alcohol use (Z78.9) 2. Caffeine use (F15.90) 3. Married 4. Never a smoker 5. Student  Review of Systems Genitourinary, constitutional, skin, eye, otolaryngeal, hematologic/lymphatic, cardiovascular, pulmonary, endocrine, musculoskeletal, gastrointestinal, neurological and psychiatric system(s) were reviewed and pertinent findings if present are noted and are otherwise negative.  Genitourinary: incontinence and hematuria.  Gastrointestinal: constipation.  Constitutional: feeling tired (fatigue).  Musculoskeletal: back pain.  Neurological: headache.  Psychiatric: anxiety.    Vitals Vital Signs [Data Includes: Last 1 Day]  Recorded: 19Sep2016 08:26AM  Blood Pressure: 115 / 79 Temperature: 97.7 F Heart Rate: 73  Physical Exam Constitutional: Well nourished and well developed . No acute distress.  ENT:. The ears and nose are normal in appearance.  Neck: The appearance of the neck is normal and no neck mass is present.  Pulmonary: No respiratory distress and normal respiratory rhythm and effort.  Cardiovascular: Heart rate and rhythm are normal . No peripheral edema.  Abdomen: The abdomen is soft and nontender. No masses are palpated. No CVA tenderness. No  hernias are palpable. No hepatosplenomegaly noted.  Lymphatics: The femoral and inguinal nodes are not enlarged or tender.  Skin: Normal skin  turgor, no visible rash and no visible skin lesions.  Neuro/Psych:. Mood and affect are appropriate.    Results/Data Urine [Data Includes: Last 1 Day]   [id number]  COLOR YELLOW   APPEARANCE CLEAR   SPECIFIC GRAVITY 1.020   pH 6.0   GLUCOSE NEGATIVE   BILIRUBIN NEGATIVE   KETONE NEGATIVE   BLOOD 2+   PROTEIN NEGATIVE   NITRITE NEGATIVE   LEUKOCYTE ESTERASE NEGATIVE   SQUAMOUS EPITHELIAL/HPF 0-5 HPF  WBC 0-5 WBC/HPF  RBC 3-10 RBC/HPF  BACTERIA FEW HPF  CRYSTALS NONE SEEN HPF  CASTS NONE SEEN LPF  Yeast NONE SEEN HPF   CT and KUB reviewed: 12 cm stone material aggregate in LLP identified in LLP and in mid pole including calyx. she has LUP stone, and also right lower pole stone. she has 349 HU and stone is within 10cm from skin edge. My concern is that she does not want JJ stent, and that she wants lithotripsy. i think she has too much stone material for lithotripsy, and that she has better chance of stone clearance woth perc, but that I don't think she can expect total clearance, even with perc because of mid pole calyx stone, and also uypper pole calyx stone. she will need f/u llitho in the future, and then future R renal litho. She needs Litholink now.    Diet: fast foods: range: Macd[employer]'s 1/week ( worst)     sodas: worst: coke: 1-2/day. 20 oz- 1 q week.   Assessment Assessed  1. Nephrolithiasis (N20.0) 2. Microscopic hematuria (R31.2)  Chisato has large volume w/stone aggregate in the LLP and in the L mid pole. 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 Note father, grandfather, cousins all with hx of stones. We will work around her school, and will plan for surgery over Christmas break.   Plan Health Maintenance  1. UA With REFLEX; [Do Not Release]; Status:Resulted - Requires Verification;   Done:  [id number] 08:08AM Microscopic hematuria, Nephrolithiasis  2. Litholink Stone Risk-Urine;  Status:Hold For - [employer];  Requested for:19Sep2016;  Nephrolithiasis  3. Start: Oxycodone-Acetaminophen 5-325 MG Oral Tablet; TAKE 1 TABLET Every  4 hours  PRN pain  1. Litholink  2. Schedule percutaneous nephrolithotomy, with nephro-ureteral stent ( ? Konfy catheter) . Will need f/u lithotripsy Pt will call Kim to schedule. ( December)  3. Ok to stop Flomax  4. Oxycodone for severe pain.   Signatures Electronically signed by : Carolan Clines, M.D.; Jul 11 2015  9:41AM EST

## 2015-09-22 NOTE — Discharge Instructions (Signed)
DISCHARGE INSTRUCTIONS FOR KIDNEY STONES OR URETERAL STENT  MEDICATIONS:   1. DO NOT RESUME YOUR ASPIRIN, or any other medicines like ibuprofen, motrin, excedrin, advil, aleve, vitamin E, fish oil as these can all cause bleeding x 7 days.  2. Resume all your other meds from home - except do not take any other pain meds that you may have at home.  ACTIVITY 1. No strenuous activity x 1week 2. No driving while on narcotic pain medications 3. Drink plenty of water 4. Continue to walk at home - you can still get blood clots when you are at home, so keep active, but don't over do it. 5. May return to work in 3 days.  BATHING 1. You can shower and we recommend daily showers  2. If you have a string coming from your urethra:  The stent string is attached to your ureteral stent.  Do not pull on this.   SIGNS/SYMPTOMS TO CALL: 1. Please call us if you have a fever greater than 101.5, uncontrolled  nausea/vomiting, uncontrolled pain, dizziness, unable to urinate, bloody urine, chest pain, shortness of breath, leg swelling, leg pain, redness around wound, drainage from wound, or any other concerns or questions.  You can reach [place] at [phone].  FOLLOW-UP 1. You have an appointment for JJ removal and f/u x-ray.

## 2015-09-22 NOTE — Anesthesia Preprocedure Evaluation (Addendum)
Anesthesia Evaluation  Patient identified by MRN, date of birth, ID band Patient awake    Reviewed: Allergy & Precautions, NPO status , Patient's Chart, lab work & pertinent test results  History of Anesthesia Complications (+) Family history of anesthesia reaction  Airway Mallampati: II  TM Distance: >3 FB Neck ROM: Full    Dental no notable dental hx.    Pulmonary neg pulmonary ROS,    Pulmonary exam normal breath sounds clear to auscultation       Cardiovascular Exercise Tolerance: Good negative cardio ROS Normal cardiovascular exam Rhythm:Regular Rate:Normal     Neuro/Psych  Headaches, PSYCHIATRIC DISORDERS Anxiety Depression    GI/Hepatic Neg liver ROS, GERD  Medicated,  Endo/Other  diabetes, Gestational  Renal/GU Renal disease  negative genitourinary   Musculoskeletal negative musculoskeletal ROS (+)   Abdominal   Peds negative pediatric ROS (+)  Hematology  (+) anemia ,   Anesthesia Other Findings   Reproductive/Obstetrics negative OB ROS                            Anesthesia Physical Anesthesia Plan  ASA: II  Anesthesia Plan: General   Post-op Pain Management:    Induction: Intravenous  Airway Management Planned: LMA  Additional Equipment:   Intra-op Plan:   Post-operative Plan: Extubation in OR  Informed Consent: I have reviewed the patients History and Physical, chart, labs and discussed the procedure including the risks, benefits and alternatives for the proposed anesthesia with the patient or authorized representative who has indicated his/her understanding and acceptance.   Dental advisory given  Plan Discussed with: CRNA  Anesthesia Plan Comments:         Anesthesia Quick Evaluation

## 2015-09-22 NOTE — Anesthesia Procedure Notes (Signed)
Procedure Name: LMA Insertion Date/Time: 09/22/2015 7:41 AM Performed by: Montel Clock Pre-anesthesia Checklist: Patient identified, Emergency Drugs available, Suction available, Patient being monitored and Timeout performed Patient Re-evaluated:Patient Re-evaluated prior to inductionOxygen Delivery Method: Circle system utilized Preoxygenation: Pre-oxygenation with 100% oxygen Intubation Type: IV induction Ventilation: Mask ventilation without difficulty LMA: LMA with gastric port inserted LMA Size: 3.0 Number of attempts: 1 Dental Injury: Teeth and Oropharynx as per pre-operative assessment

## 2015-09-22 NOTE — Interval H&P Note (Signed)
History and Physical Interval Note:  09/22/2015 8:13 AM  Charlotte Nelson  has presented today for surgery, with the diagnosis of LEFT LOWER POLE AND MID POLE STONES  The various methods of treatment have been discussed with the patient and family. After consideration of risks, benefits and other options for treatment, the patient has consented to  Procedure(s): CYSTOSCOPY WITH LEFT  RETROGRADE PYELOGRAM/ STENT PLACEMENT (Left) as a surgical intervention .  The patient's history has been reviewed, patient examined, no change in status, stable for surgery.  I have reviewed the patient's chart and labs.  Questions were answered to the patient's satisfaction.     Nichollas Perusse I Kennett Symes

## 2015-09-22 NOTE — Transfer of Care (Signed)
Immediate Anesthesia Transfer of Care Note  Patient: Charlotte Nelson  Procedure(s) Performed: Procedure(s): CYSTOSCOPY WITH LEFT  RETROGRADE PYELOGRAM/ STENT PLACEMENT (Left)  Patient Location: PACU  Anesthesia Type:General  Level of Consciousness:  sedated, patient cooperative and responds to stimulation  Airway & Oxygen Therapy:Patient Spontanous Breathing and Patient connected to face mask oxgen  Post-op Assessment:  Report given to PACU RN and Post -op Vital signs reviewed and stable  Post vital signs:  Reviewed and stable  Last Vitals:  Filed Vitals:   09/22/15 0551 09/22/15 0824  BP: 114/78 105/66  Pulse: 90 65  Temp: 36.6 C   Resp: 18 8    Complications: No apparent anesthesia complications

## 2015-09-22 NOTE — Op Note (Signed)
Pre-operative diagnosis :  Multiple Left mid-pole nephrolithiasis and single Left upper pole nephrolithiasis  Postoperative diagnosis:  same   Operation:  Cystoscopy, Left retrograde pyelogram with interpretation; passage of Left JJ stent, 6 F x 24 cm ( no suture)  Surgeon:  S. Gaynelle Arabian, MD  First assistant:  None  Anesthesia:   Gen LMA  Preparation: After appropriate preanesthesia, the patient was brought the operating, placed on the operating table in the dorsal supine position where general LMA anesthesia was introduced. She was replaced in the dorsal lithotomy position with the pubis was prepped with Betadine solution and draped in usual fashion. The left arm was previously marked. The KUB was previously examined, and shows multiple stones in the left renal pelvis, in the mid pole, and a single stone in the upper pole. The case was again discussed with the patient, and we have elected to proceed with lithotripsy today with double-J catheter placement preoperatively.  Review history:  Problems  1. Microscopic hematuria (R31.2) 2. Nephrolithiasis (N20.0)  History of Present Illness 34 yo returns today for a 2 mo f/u. She has an extensive family history of nephrolithiasis who we are seeing in consultation for LEFT flank pain at the request of Dr. Dianah Field. She was recently seen in the [facility] ER with worsening LEFT flank pain. She reports a CT scan was done that demonstrated bilateral nephrolithiasis (she states she was told 1.2 x 0.6 cm lower pole intra-renal stone on the LEFT as well as smaller stones on the right with no evidence of obstruction bilaterally, however we do not have either these images to review or a report of these images). She was been experiencing intermittent LEFT flank pain over the last 10 weeks without any inciting event/trauma. No aggravating or alleviating factors. She has had some mild benefit with a muscle relaxant she does not know the name of as well as  oxycodone while she was in the ED, but she was not discharge home with anything for pain. She was started on flomax by her PCP which has been unhelpful. She states that Ibuprofen "does not touch her pain". She has had no associated nausea, emesis or voiding symptoms. She was told she had a urinary tract infection by her PCP several days ago and was started on an antibiotic which she completed. She is unsure is her culture was positive but was asymptomatic. She denies fevers or chills.  She has no prior urologic history including infections and prior stone episodes. She denies history of bowel surgery, calcium supplementation, Topiramate use.    CT shows multiple L mid-pole stones ( 7) as aggregate ( 1cm x 1.5 cm ) , and single 93mm LUP stone. She has not completed her Litholink, but desires lithotripsy for stone dissolution.   Past Medical History Problems  1. History of Anxiety (F41.9) 2. History of esophageal reflux (Z87.19)  Statement of  Likelihood of Success: Excellent. TIME-OUT observed.:  Procedure:  The 21 French cystoscope was then passed through the urethra without difficulty, and the bladder appears within normal limits. The trigone is normal, and there is clear reflux from both orifices. Left retrograde pyelogram is performed and shows a normal-appearing ureter without hydronephrosis. The left renal pelvis is identified, and multiple stones were identified within the left kidney on plain film. This film is developed for use in the lithotriptor. Retrograde Polygram is a performed which shows a normal ureter, and also showed the location of the stones within the renal pelvis. A 0.038 guidewire is  then coiled in the renal pelvis, and over this, a 6 [place] by 24 cm double-J stent is passed, and coiled in the renal pelvis and coiled in the bladder. No suture is attached to the bottom of the double-J stent. The patient tolerated procedure well. The bladder is drained of fluid. The patient was  awakened and taken to recovery room in good condition. She will proceed to  lithotripsy when awake.

## 2015-09-23 ENCOUNTER — Encounter (HOSPITAL_COMMUNITY): Payer: Self-pay | Admitting: Urology

## 2015-09-24 ENCOUNTER — Other Ambulatory Visit: Payer: Self-pay | Admitting: Urology

## 2015-09-24 MED ORDER — SOLIFENACIN SUCCINATE 5 MG PO TABS: 5.0000 mg | ORAL_TABLET | Freq: Every day | ORAL | Status: DC

## 2015-09-24 MED ORDER — OXYBUTYNIN CHLORIDE 5 MG PO TABS: 5.0000 mg | ORAL_TABLET | Freq: Three times a day (TID) | ORAL | Status: DC | PRN

## 2015-11-04 ENCOUNTER — Ambulatory Visit (INDEPENDENT_AMBULATORY_CARE_PROVIDER_SITE_OTHER): Payer: 59

## 2015-11-04 ENCOUNTER — Encounter: Payer: Self-pay | Admitting: Sports Medicine

## 2015-11-04 ENCOUNTER — Ambulatory Visit (INDEPENDENT_AMBULATORY_CARE_PROVIDER_SITE_OTHER): Payer: 59 | Admitting: Sports Medicine

## 2015-11-04 VITALS — BP 135/96 | HR 101 | Resp 18 | Wt 159.0 lb

## 2015-11-04 DIAGNOSIS — M25572 Pain in left ankle and joints of left foot: Secondary | ICD-10-CM | POA: Diagnosis not present

## 2015-11-04 DIAGNOSIS — M25571 Pain in right ankle and joints of right foot: Secondary | ICD-10-CM | POA: Diagnosis not present

## 2015-11-04 NOTE — Assessment & Plan Note (Signed)
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 Bilateral x-rays, restart NSAIDs, return for custom orthotics.

## 2015-11-04 NOTE — Progress Notes (Signed)
  Subjective:    CC: Bilateral ankle pain  HPI: This is a pleasant 35 year old female, for the past couple weeks she has had pain that she localizes over both talar domes, left worse than right, worse with weightbearing. Moderate, persistent without radiation. No trauma.  Past medical history, Surgical history, Family history not pertinant except as noted below, Social history, Allergies, and medications have been entered into the medical record, reviewed, and no changes needed.   Review of Systems: No fevers, chills, night sweats, weight loss, chest pain, or shortness of breath.   Objective:    General: Well Developed, well nourished, and in no acute distress.  Neuro: Alert and oriented x3, extra-ocular muscles intact, sensation grossly intact.  HEENT: Normocephalic, atraumatic, pupils equal round reactive to light, neck supple, no masses, no lymphadenopathy, thyroid nonpalpable.  Skin: Warm and dry, no rashes. Cardiac: Regular rate and rhythm, no murmurs rubs or gallops, no lower extremity edema.  Respiratory: Clear to auscultation bilaterally. Not using accessory muscles, speaking in full sentences. Bilateral Ankle: No visible erythema or swelling. Range of motion is full in all directions. Strength is 5/5 in all directions. Stable lateral and medial ligaments; squeeze test and kleiger test unremarkable; Talar dome nontender; tender to palpation laterally at the sinus tarsi, bilateral pes planus No pain at base of 5th MT; No tenderness over cuboid; No tenderness over N spot or navicular prominence No tenderness on posterior aspects of lateral and medial malleolus No sign of peroneal tendon subluxations; Negative tarsal tunnel tinel's Able to walk 4 steps.  Impression and Recommendations:

## 2015-11-08 ENCOUNTER — Encounter: Payer: Self-pay | Admitting: Sports Medicine

## 2015-11-08 ENCOUNTER — Ambulatory Visit (INDEPENDENT_AMBULATORY_CARE_PROVIDER_SITE_OTHER): Payer: 59 | Admitting: Sports Medicine

## 2015-11-08 VITALS — BP 119/78 | HR 76 | Resp 18 | Wt 159.0 lb

## 2015-11-08 DIAGNOSIS — M25572 Pain in left ankle and joints of left foot: Secondary | ICD-10-CM | POA: Diagnosis not present

## 2015-11-08 DIAGNOSIS — M25571 Pain in right ankle and joints of right foot: Secondary | ICD-10-CM

## 2015-11-08 NOTE — Assessment & Plan Note (Signed)
Moderate improvement with NSAIDs, custom orthotics as above.

## 2015-11-08 NOTE — Progress Notes (Signed)

## 2015-12-09 ENCOUNTER — Ambulatory Visit (INDEPENDENT_AMBULATORY_CARE_PROVIDER_SITE_OTHER): Payer: 59 | Admitting: Sports Medicine

## 2015-12-09 ENCOUNTER — Encounter: Payer: Self-pay | Admitting: Sports Medicine

## 2015-12-09 VITALS — BP 115/75 | HR 80 | Resp 18 | Wt 169.0 lb

## 2015-12-09 DIAGNOSIS — M25572 Pain in left ankle and joints of left foot: Secondary | ICD-10-CM

## 2015-12-09 DIAGNOSIS — M25571 Pain in right ankle and joints of right foot: Secondary | ICD-10-CM

## 2015-12-09 NOTE — Assessment & Plan Note (Signed)
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 Return in one month, we will consider bilateral talocrural joint injection if no better.

## 2015-12-09 NOTE — Progress Notes (Signed)
  Subjective:    CC: Follow-up  HPI: Bilateral ankle pain: About 60% better with custom orthotics and NSAIDs, not yet ready to consider injection.  Past medical history, Surgical history, Family history not pertinant except as noted below, Social history, Allergies, and medications have been entered into the medical record, reviewed, and no changes needed.   Review of Systems: No fevers, chills, night sweats, weight loss, chest pain, or shortness of breath.   Objective:    General: Well Developed, well nourished, and in no acute distress.  Neuro: Alert and oriented x3, extra-ocular muscles intact, sensation grossly intact.  HEENT: Normocephalic, atraumatic, pupils equal round reactive to light, neck supple, no masses, no lymphadenopathy, thyroid nonpalpable.  Skin: Warm and dry, no rashes. Cardiac: Regular rate and rhythm, no murmurs rubs or gallops, no lower extremity edema.  Respiratory: Clear to auscultation bilaterally. Not using accessory muscles, speaking in full sentences.  Impression and Recommendations:

## 2016-01-06 ENCOUNTER — Ambulatory Visit (INDEPENDENT_AMBULATORY_CARE_PROVIDER_SITE_OTHER): Payer: 59 | Admitting: Sports Medicine

## 2016-01-06 VITALS — BP 125/84 | HR 81 | Resp 18 | Wt 162.4 lb

## 2016-01-06 DIAGNOSIS — R5382 Chronic fatigue, unspecified: Secondary | ICD-10-CM | POA: Diagnosis not present

## 2016-01-06 DIAGNOSIS — R5383 Other fatigue: Secondary | ICD-10-CM | POA: Insufficient documentation

## 2016-01-06 DIAGNOSIS — M25572 Pain in left ankle and joints of left foot: Secondary | ICD-10-CM

## 2016-01-06 DIAGNOSIS — M25571 Pain in right ankle and joints of right foot: Secondary | ICD-10-CM

## 2016-01-06 LAB — IRON AND TIBC
%SAT: 15 % (ref 11–50)
Iron: 56 ug/dL (ref 40–190)
TIBC: 369 ug/dL (ref 250–450)
UIBC: 313 ug/dL (ref 125–400)

## 2016-01-06 LAB — FERRITIN: Ferritin: 60 ng/mL (ref 10–154)

## 2016-01-06 LAB — COMPREHENSIVE METABOLIC PANEL WITH GFR
Alkaline Phosphatase: 63 U/L (ref 33–115)
Calcium: 9 mg/dL (ref 8.6–10.2)
Creat: 0.77 mg/dL (ref 0.50–1.10)
Glucose, Bld: 111 mg/dL — ABNORMAL HIGH (ref 65–99)
Total Bilirubin: 0.3 mg/dL (ref 0.2–1.2)

## 2016-01-06 LAB — COMPREHENSIVE METABOLIC PANEL
ALT: 14 U/L (ref 6–29)
AST: 14 U/L (ref 10–30)
Albumin: 4 g/dL (ref 3.6–5.1)
BUN: 18 mg/dL (ref 7–25)
CO2: 25 mmol/L (ref 20–31)
Chloride: 104 mmol/L (ref 98–110)
Potassium: 4.1 mmol/L (ref 3.5–5.3)
Sodium: 136 mmol/L (ref 135–146)
Total Protein: 6.9 g/dL (ref 6.1–8.1)

## 2016-01-06 LAB — CBC
HCT: 38.9 % (ref 36.0–46.0)
Hemoglobin: 12.7 g/dL (ref 12.0–15.0)
MCH: 25.5 pg — ABNORMAL LOW (ref 26.0–34.0)
MCHC: 32.6 g/dL (ref 30.0–36.0)
MCV: 78.1 fL (ref 78.0–100.0)
MPV: 10.3 fL (ref 8.6–12.4)
Platelets: 269 10*3/uL (ref 150–400)
RBC: 4.98 MIL/uL (ref 3.87–5.11)
RDW: 14.1 % (ref 11.5–15.5)
WBC: 7.2 10*3/uL (ref 4.0–10.5)

## 2016-01-06 LAB — TSH: TSH: 2.46 mIU/L

## 2016-01-06 LAB — VITAMIN B12: Vitamin B-12: 429 pg/mL (ref 200–1100)

## 2016-01-06 LAB — FOLATE: Folate: 19.6 ng/mL (ref >5.4)

## 2016-01-06 NOTE — Progress Notes (Signed)
  Subjective:    CC: Follow-up  HPI: Bilateral ankle pain: Clinically diagnosed as sinus tarsi syndrome, has responded well to orthotics as well as NSAIDs, she is only using them once per day. Has not yet plateaued, continues to improve so would like to defer injection until the next visit.  Fatigue: Notes increasing anxiety however also has a history of chronic nephrolithiasis with chronic hematuria, is curious if she is anemic.  Past medical history, Surgical history, Family history not pertinant except as noted below, Social history, Allergies, and medications have been entered into the medical record, reviewed, and no changes needed.   Review of Systems: No fevers, chills, night sweats, weight loss, chest pain, or shortness of breath.   Objective:    General: Well Developed, well nourished, and in no acute distress.  Neuro: Alert and oriented x3, extra-ocular muscles intact, sensation grossly intact.  HEENT: Normocephalic, atraumatic, pupils equal round reactive to light, neck supple, no masses, no lymphadenopathy, thyroid nonpalpable.  Skin: Warm and dry, no rashes. Cardiac: Regular rate and rhythm, no murmurs rubs or gallops, no lower extremity edema.  Respiratory: Clear to auscultation bilaterally. Not using accessory muscles, speaking in full sentences.  Impression and Recommendations:    I spent 25 minutes with this patient, greater than 50% was face-to-face time counseling regarding the above diagnoses

## 2016-01-06 NOTE — Assessment & Plan Note (Signed)
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 Only using NSAIDs once daily, will increase to twice daily. Return in one month, injection if no better.

## 2016-01-06 NOTE — Assessment & Plan Note (Signed)
Unclear etiology, history of chronic nephrolithiasis with chronic blood loss, checking routine blood work, I do suspect this will be related to depression which we will discuss in further detail at the next visit.

## 2016-01-13 ENCOUNTER — Ambulatory Visit (INDEPENDENT_AMBULATORY_CARE_PROVIDER_SITE_OTHER): Payer: 59 | Admitting: Sports Medicine

## 2016-01-13 ENCOUNTER — Encounter: Payer: Self-pay | Admitting: Sports Medicine

## 2016-01-13 VITALS — BP 111/62 | HR 79 | Temp 98.3°F | Resp 18 | Wt 163.5 lb

## 2016-01-13 DIAGNOSIS — M545 Low back pain: Secondary | ICD-10-CM | POA: Diagnosis not present

## 2016-01-13 DIAGNOSIS — N200 Calculus of kidney: Secondary | ICD-10-CM

## 2016-01-13 LAB — POCT URINALYSIS DIPSTICK
Bilirubin, UA: NEGATIVE
Glucose, UA: NEGATIVE
Ketones, UA: NEGATIVE
Nitrite, UA: NEGATIVE
Protein, UA: NEGATIVE
Spec Grav, UA: 1.02
Urobilinogen, UA: 0.2
pH, UA: 6

## 2016-01-13 MED ORDER — PHENAZOPYRIDINE HCL 200 MG PO TABS: 200.0000 mg | ORAL_TABLET | Freq: Three times a day (TID) | ORAL | Status: AC

## 2016-01-13 MED ORDER — CIPROFLOXACIN HCL 750 MG PO TABS: 750.0000 mg | ORAL_TABLET | Freq: Two times a day (BID) | ORAL | Status: DC

## 2016-01-13 NOTE — Progress Notes (Signed)
  Subjective:    CC: flank pain  HPI: This is a pleasant 35 -year-old female with history of nephrolithiasis who comes in with new onset right flank pain, dysuria. No constitutional symptoms. Symptoms are moderate, persistent  Past medical history, Surgical history, Family history not pertinant except as noted below, Social history, Allergies, and medications have been entered into the medical record, reviewed, and no changes needed.   Review of Systems: No fevers, chills, night sweats, weight loss, chest pain, or shortness of breath.   Objective:    General: Well Developed, well nourished, and in no acute distress.  Neuro: Alert and oriented x3, extra-ocular muscles intact, sensation grossly intact.  HEENT: Normocephalic, atraumatic, pupils equal round reactive to light, neck supple, no masses, no lymphadenopathy, thyroid nonpalpable.  Skin: Warm and dry, no rashes. Cardiac: Regular rate and rhythm, no murmurs rubs or gallops, no lower extremity edema.  Respiratory: Clear to auscultation bilaterally. Not using accessory muscles, speaking in full sentences. Abdomen: Soft, nontender, nondistended, normal bowel sounds, palpable masses, no guarding, rigidity, rebound tenderness.  Urinalysis shows pyuria and microscopic hematuria  Impression and Recommendations:

## 2016-01-13 NOTE — Assessment & Plan Note (Signed)
Now with right-sided pain,as well as microscopic hematuria and pyuria. This likely represents right nephrolithiasis with likely mild pyelonephritis. Cipro for 10 days, peridium. Keep follow-up with urologist.

## 2016-01-15 LAB — URINE CULTURE
Colony Count: NO GROWTH
Organism ID, Bacteria: NO GROWTH

## 2016-01-25 ENCOUNTER — Other Ambulatory Visit: Payer: Self-pay | Admitting: Urology

## 2016-01-27 ENCOUNTER — Telehealth: Payer: Self-pay | Admitting: Sports Medicine

## 2016-01-27 MED ORDER — CEPHALEXIN 500 MG PO CAPS: 500.0000 mg | ORAL_CAPSULE | Freq: Two times a day (BID) | ORAL | Status: DC

## 2016-01-27 NOTE — Telephone Encounter (Signed)
Pt advised of new Rx and that she needs to be evaluated by her urologist. Pt will schedule an appointment.

## 2016-01-27 NOTE — Telephone Encounter (Signed)
Adding Keflex. Needs to get in with urologist. Symptoms are likely more related to nephrolithiasis rather than infection at this point.

## 2016-01-27 NOTE — Telephone Encounter (Signed)
Pt called to request a new antibiotic Rx. Pt states the finished the cipro but is still having low back pain. Pt states that is the typical symptom she has for UTI's. Pt does have a lithotripsy procedure scheduled for June but no routine follow up with her urologist. Pt also states "there is no way I could get a quick appointment with them for this." Pt would not be able to come into office to provide a new urine sample and would just like an Rx sent to her pharmacy on file. Will route.

## 2016-02-10 ENCOUNTER — Ambulatory Visit: Payer: 59 | Admitting: Sports Medicine

## 2016-02-14 ENCOUNTER — Encounter: Payer: Self-pay | Admitting: Sports Medicine

## 2016-02-14 ENCOUNTER — Ambulatory Visit (INDEPENDENT_AMBULATORY_CARE_PROVIDER_SITE_OTHER): Payer: 59 | Admitting: Sports Medicine

## 2016-02-14 VITALS — BP 116/84 | HR 97 | Resp 18 | Wt 168.6 lb

## 2016-02-14 DIAGNOSIS — M25571 Pain in right ankle and joints of right foot: Secondary | ICD-10-CM | POA: Diagnosis not present

## 2016-02-14 DIAGNOSIS — R5382 Chronic fatigue, unspecified: Secondary | ICD-10-CM | POA: Diagnosis not present

## 2016-02-14 DIAGNOSIS — F39 Unspecified mood [affective] disorder: Secondary | ICD-10-CM

## 2016-02-14 DIAGNOSIS — N200 Calculus of kidney: Secondary | ICD-10-CM

## 2016-02-14 DIAGNOSIS — M25572 Pain in left ankle and joints of left foot: Secondary | ICD-10-CM

## 2016-02-14 MED ORDER — CIPROFLOXACIN HCL 750 MG PO TABS
750.0000 mg | ORAL_TABLET | Freq: Two times a day (BID) | ORAL | Status: DC
Start: 2016-02-14 — End: 2016-03-30

## 2016-02-14 NOTE — Assessment & Plan Note (Addendum)
Most likely secondary to depression as above.

## 2016-02-14 NOTE — Progress Notes (Signed)
  Subjective:    CC:  Follow-up  HPI:  bilateral ankle pain: Sinus tarsi syndrome, has failed all conservative measures, agreeable for bilateral injection today.   Left-sided nephrolithiasis : Lithotripsy is now scheduled.   Depression: Has recently restarted her Brintellix, starting to feel better, including her fatigue.  Past medical history, Surgical history, Family history not pertinant except as noted below, Social history, Allergies, and medications have been entered into the medical record, reviewed, and no changes needed.   Review of Systems: No fevers, chills, night sweats, weight loss, chest pain, or shortness of breath.   Objective:    General: Well Developed, well nourished, and in no acute distress.  Neuro: Alert and oriented x3, extra-ocular muscles intact, sensation grossly intact.  HEENT: Normocephalic, atraumatic, pupils equal round reactive to light, neck supple, no masses, no lymphadenopathy, thyroid nonpalpable.  Skin: Warm and dry, no rashes. Cardiac: Regular rate and rhythm, no murmurs rubs or gallops, no lower extremity edema.  Respiratory: Clear to auscultation bilaterally. Not using accessory muscles, speaking in full sentences.  Procedure: Real-time Ultrasound Guided Injection of Right ankle Device: GE Logiq E  Verbal informed consent obtained.  Time-out conducted.  Noted no overlying erythema, induration, or other signs of local infection.  Skin prepped in a sterile fashion.  Local anesthesia: Topical Ethyl chloride.  With sterile technique and under real time ultrasound guidance:  1 mL kenalog 40, 1 mL lidocaine, 1 mL Marcaine injected easily into the talocrural joint. Completed without difficulty  Pain immediately resolved suggesting accurate placement of the medication.  Advised to call if fevers/chills, erythema, induration, drainage, or persistent bleeding.  Images permanently stored and available for review in the ultrasound unit.  Impression:  Technically successful ultrasound guided injection.  Procedure: Real-time Ultrasound Guided Injection of left ankle Device: GE Logiq E  Verbal informed consent obtained.  Time-out conducted.  Noted no overlying erythema, induration, or other signs of local infection.  Skin prepped in a sterile fashion.  Local anesthesia: Topical Ethyl chloride.  With sterile technique and under real time ultrasound guidance:  1 mL kenalog 40, 1 mL lidocaine, 1 mL Marcaine injected easily into the talocrural joint. Completed without difficulty  Pain immediately resolved suggesting accurate placement of the medication.  Advised to call if fevers/chills, erythema, induration, drainage, or persistent bleeding.  Images permanently stored and available for review in the ultrasound unit.  Impression: Technically successful ultrasound guided injection.  Impression and Recommendations:    I spent 25 minutes with this patient, greater than 50% was face-to-face time counseling regarding the above diagnoses

## 2016-02-14 NOTE — Assessment & Plan Note (Signed)
Most likely sinus tarsi syndrome , failed all conservative measures including orthotics, ankle joint injections as above.  Return in one month.

## 2016-02-14 NOTE — Assessment & Plan Note (Addendum)
Lithotripsy scheduled. Emergency supply of ciprofloxacin given.

## 2016-02-14 NOTE — Assessment & Plan Note (Signed)
Fatigue is likely related to depression, sh is starting to feel better, recently restarted her Brintellix.

## 2016-02-15 ENCOUNTER — Ambulatory Visit: Payer: 59 | Admitting: Sports Medicine

## 2016-03-13 ENCOUNTER — Ambulatory Visit: Payer: 59 | Admitting: Sports Medicine

## 2016-03-30 ENCOUNTER — Other Ambulatory Visit: Payer: Self-pay | Admitting: Urology

## 2016-03-30 ENCOUNTER — Ambulatory Visit (HOSPITAL_BASED_OUTPATIENT_CLINIC_OR_DEPARTMENT_OTHER)
Admission: RE | Admit: 2016-03-30 | Discharge: 2016-03-30 | Disposition: A | Discharge status: Home / Self Care | Payer: 59 | Source: Ambulatory Visit | Attending: Sports Medicine | Admitting: Sports Medicine

## 2016-03-30 ENCOUNTER — Encounter (HOSPITAL_COMMUNITY): Payer: Self-pay

## 2016-03-30 ENCOUNTER — Ambulatory Visit (INDEPENDENT_AMBULATORY_CARE_PROVIDER_SITE_OTHER): Payer: 59 | Admitting: Sports Medicine

## 2016-03-30 ENCOUNTER — Encounter: Payer: Self-pay | Admitting: Sports Medicine

## 2016-03-30 ENCOUNTER — Encounter (HOSPITAL_BASED_OUTPATIENT_CLINIC_OR_DEPARTMENT_OTHER): Payer: Self-pay

## 2016-03-30 VITALS — BP 135/89 | HR 86 | Resp 18 | Wt 167.0 lb

## 2016-03-30 DIAGNOSIS — R29818 Other symptoms and signs involving the nervous system: Secondary | ICD-10-CM

## 2016-03-30 DIAGNOSIS — G43809 Other migraine, not intractable, without status migrainosus: Secondary | ICD-10-CM | POA: Diagnosis not present

## 2016-03-30 MED ORDER — IOPAMIDOL (ISOVUE-370) INJECTION 76%
100.0000 mL | Freq: Once | INTRAVENOUS | Status: AC | PRN
  Administered 2016-03-30: 100 mL via INTRAVENOUS

## 2016-03-30 MED ORDER — TOPIRAMATE 50 MG PO TABS: ORAL_TABLET | ORAL | Status: DC

## 2016-03-30 NOTE — Assessment & Plan Note (Signed)
Turned her head to the left, heard a loud pop and then lost hearing and vision on the left side, complete anopsia of the left eye. Followed by numbness of the right side, and a dull headache. Though this likely represents a complex migraine, we are going to get a CT angiogram of the neck and a CT of her head considering severity of headache, and relation to relative neck trauma. Adding Topamax.

## 2016-03-30 NOTE — Assessment & Plan Note (Signed)
Turned her head to the left, heard a loud pop and then lost hearing and vision on the left side, complete anopsia of the left eye. Followed by numbness of the right side, and a dull headache. Though this likely represents a complex migraine, we are going to get a CT angiogram of the neck and a CT of her head considering severity of headache, and relation to relative neck trauma.

## 2016-03-30 NOTE — Progress Notes (Signed)
  Subjective:    CC: neurologic symptoms  HPI: This is a pleasant 35 year old female, she is a history of migraines,Today she turned her head to the left, felt a pop in her left ear and then endorsed numbness on the left and then the right side of her body, visual loss completely in the left eye, as well as hearing loss. Overall symptoms have improved. Mild headache.  Past medical history, Surgical history, Family history not pertinant except as noted below, Social history, Allergies, and medications have been entered into the medical record, reviewed, and no changes needed.   Review of Systems: No fevers, chills, night sweats, weight loss, chest pain, or shortness of breath.   Objective:    General: Well Developed, well nourished, and in no acute distress.  Neuro: Alert and oriented x3, extra-ocular muscles intact, sensation grossly intact.  Cranial nerves II through XII are intact, motor, sensory, coordinative options are all intact, negative Romberg sign, fundoscopy was negative. HEENT: Normocephalic, atraumatic, pupils equal round reactive to light, neck supple, no masses, no lymphadenopathy, thyroid nonpalpable.  Skin: Warm and dry, no rashes. Cardiac: Regular rate and rhythm, no murmurs rubs or gallops, no lower extremity edema.  Respiratory: Clear to auscultation bilaterally. Not using accessory muscles, speaking in full sentences.  Impression and Recommendations:    I spent 25 minutes with this patient, greater than 50% was face-to-face time counseling regarding the above diagnoses

## 2016-04-05 ENCOUNTER — Ambulatory Visit (HOSPITAL_COMMUNITY)
Admission: RE | Admit: 2016-04-05 | Discharge: 2016-04-05 | Disposition: A | Discharge status: Home / Self Care | Payer: 59 | Source: Ambulatory Visit | Attending: Urology | Admitting: Urology

## 2016-04-05 ENCOUNTER — Encounter (HOSPITAL_COMMUNITY)
Admission: RE | Disposition: A | Discharge status: Home / Self Care | Payer: Self-pay | Source: Ambulatory Visit | Attending: Urology

## 2016-04-05 ENCOUNTER — Ambulatory Visit (HOSPITAL_COMMUNITY): Payer: 59

## 2016-04-05 ENCOUNTER — Encounter (HOSPITAL_COMMUNITY): Payer: Self-pay | Admitting: General Practice

## 2016-04-05 DIAGNOSIS — E119 Type 2 diabetes mellitus without complications: Secondary | ICD-10-CM | POA: Insufficient documentation

## 2016-04-05 DIAGNOSIS — I10 Essential (primary) hypertension: Secondary | ICD-10-CM | POA: Insufficient documentation

## 2016-04-05 DIAGNOSIS — N200 Calculus of kidney: Secondary | ICD-10-CM | POA: Diagnosis not present

## 2016-04-05 LAB — PREGNANCY, URINE: PREG TEST UR: NEGATIVE

## 2016-04-05 SURGERY — LITHOTRIPSY, ESWL
Anesthesia: LOCAL | Laterality: Left

## 2016-04-05 MED ORDER — DIAZEPAM 5 MG PO TABS
10.0000 mg | ORAL_TABLET | ORAL | Status: AC
  Administered 2016-04-05: 10 mg via ORAL
  Filled 2016-04-05: qty 2

## 2016-04-05 MED ORDER — OXYCODONE-ACETAMINOPHEN 5-325 MG PO TABS
1.0000 | ORAL_TABLET | ORAL | Status: DC | PRN
  Administered 2016-04-05: 1 via ORAL
  Filled 2016-04-05: qty 1

## 2016-04-05 MED ORDER — SODIUM CHLORIDE 0.9 % IV SOLN
INTRAVENOUS | Status: DC
  Administered 2016-04-05: 09:00:00 via INTRAVENOUS

## 2016-04-05 MED ORDER — CIPROFLOXACIN HCL 500 MG PO TABS
500.0000 mg | ORAL_TABLET | ORAL | Status: AC
  Administered 2016-04-05: 500 mg via ORAL
  Filled 2016-04-05: qty 1

## 2016-04-05 MED ORDER — DIPHENHYDRAMINE HCL 25 MG PO CAPS
25.0000 mg | ORAL_CAPSULE | ORAL | Status: AC
  Administered 2016-04-05: 25 mg via ORAL
  Filled 2016-04-05: qty 1

## 2016-04-05 NOTE — Interval H&P Note (Signed)
History and Physical Interval Note:  04/05/2016 9:08 AM  Charlotte Nelson  has presented today for surgery, with the diagnosis of LEFT RENAL STONES  The various methods of treatment have been discussed with the patient and family. After consideration of risks, benefits and other options for treatment, the patient has consented to  Procedure(s): LEFT EXTRACORPOREAL SHOCK WAVE POSSIBLE BILATERAL  LITHOTRIPSY (ESWL) (Left) as a surgical intervention .  The patient's history has been reviewed, patient examined, no change in status, stable for surgery.  I have reviewed the patient's chart and labs.  Questions were answered to the patient's satisfaction.     Simuel Stebner I Reinhold Rickey

## 2016-04-05 NOTE — H&P (Signed)
  History of Present Illness 35 yo female s/p Lt ESWL on 09/22/15. She has spasm post stent, and passed stone the next morning and also fragments. she has stone fragments at home, and will bring in for analysis. She has not done 24 hr urine yet. .  She has an extensive family history of nephrolithiasis who we are seeing in consultation for LEFT flank pain at the request of Dr. Dianah Field. She was recently seen in the [facility] ER with worsening LEFT flank pain. She reports a CT scan was done that demonstrated bilateral nephrolithiasis (she states she was told 1.2 x 0.6 cm lower pole intra-renal stone on the LEFT as well as smaller stones on the right with no evidence of obstruction bilaterally, however we do not have either these images to review or a report of these images). She was been experiencing intermittent LEFT flank pain over the last 10 weeks without any inciting event/trauma. No aggravating or alleviating factors. She has had some mild benefit with a muscle relaxant she does not know the name of as well as oxycodone while she was in the ED, but she was not discharge home with anything for pain. She was started on flomax by her PCP which has been unhelpful. She states that Ibuprofen "does not touch her pain". She has had no associated nausea, emesis or voiding symptoms. She was told she had a urinary tract infection by her PCP several days ago and was started on an antibiotic which she completed. She is unsure is her culture was positive but was asymptomatic. She denies fevers or chills.  She has no prior urologic history including infections and prior stone episodes. She denies history of bowel surgery, calcium supplementation, Topiramate use.  Results/Data KUB: The patient has a 5 mm right lower pole stone, and possible 5 mm right midpole soft calcification. In addition, the patient has 2 stones in the left kidney, measuring 7.5 mm, and a second midpole stone measuring 5 mm.   Assessment Assessed  1. Nephrolithiasis (N20.0) Bilateral nephrolithiasis. The patient desires to go and have lithotripsy of her left mid pole stones, which are morbid throughout the period she will follow this with right midpole lithotripsy and lower pole lithotripsy.  Plan Nephrolithiasis  1. KUB; Status:Hold For - Date of Service; Requested for:20Jul2017;  Arrange lithotripsy for patient.  Signatures Electronically signed by : Carolan Clines, M.D.; Mar 23 2016 5:15PM EST   The information contained in this medical record document is considered private and confidential patient information. This information can only be used for the medical diagnosis and/or medical services that are being provided by the patient's selected caregivers. This information can only be distributed outside of the patient's care if the patient agrees and signs waivers of authorization for this information to be sent to an outside source or route.

## 2016-04-27 ENCOUNTER — Ambulatory Visit: Payer: 59 | Admitting: Sports Medicine

## 2016-05-01 ENCOUNTER — Encounter: Payer: Self-pay | Admitting: Sports Medicine

## 2016-05-01 ENCOUNTER — Ambulatory Visit (INDEPENDENT_AMBULATORY_CARE_PROVIDER_SITE_OTHER): Payer: 59 | Admitting: Sports Medicine

## 2016-05-01 VITALS — BP 136/85 | HR 84 | Resp 18 | Ht 64.0 in | Wt 163.3 lb

## 2016-05-01 DIAGNOSIS — Z0000 Encounter for general adult medical examination without abnormal findings: Secondary | ICD-10-CM

## 2016-05-01 DIAGNOSIS — B1910 Unspecified viral hepatitis B without hepatic coma: Secondary | ICD-10-CM | POA: Diagnosis not present

## 2016-05-01 DIAGNOSIS — Z0184 Encounter for antibody response examination: Secondary | ICD-10-CM

## 2016-05-01 DIAGNOSIS — Z789 Other specified health status: Secondary | ICD-10-CM

## 2016-05-01 MED ORDER — MELOXICAM 15 MG PO TABS: 15.0000 mg | ORAL_TABLET | Freq: Every day | ORAL | Status: DC

## 2016-05-01 NOTE — Progress Notes (Signed)
  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 She needs some immunity titers as well.  Past medical history, Surgical history, Family history not pertinant except as noted below, Social history, Allergies, and medications have been entered into the medical record, reviewed, and no changes needed.   Review of Systems: No headache, visual changes, nausea, vomiting, diarrhea, constipation, dizziness, abdominal pain, skin rash, fevers, chills, night sweats, swollen lymph nodes, weight loss, chest pain, body aches, joint swelling, muscle aches, shortness of breath, mood changes, visual or auditory hallucinations.  Objective:    General: Well Developed, well nourished, and in no acute distress.  Neuro: Alert and oriented x3, extra-ocular muscles intact, sensation grossly intact. Cranial nerves II through XII are intact, motor, sensory, and coordinative functions are all intact. HEENT: Normocephalic, atraumatic, pupils equal round reactive to light, neck supple, no masses, no lymphadenopathy, thyroid nonpalpable. Oropharynx, nasopharynx, external ear canals are unremarkable. Skin: Warm and dry, no rashes noted.  Cardiac: Regular rate and rhythm, no murmurs rubs or gallops.  Respiratory: Clear to auscultation bilaterally. Not using accessory muscles, speaking in full sentences.  Abdominal: Soft, nontender, nondistended, positive bowel sounds, no masses, no organomegaly.  Musculoskeletal: Shoulder, elbow, wrist, hip, knee, ankle stable, and with full range of motion.  Impression and Recommendations:    The patient was counselled, risk factors were discussed, anticipatory guidance given.

## 2016-05-01 NOTE — Assessment & Plan Note (Signed)
Need to redo hepatitis B, MMR, varicella-zoster titers.

## 2016-05-01 NOTE — Assessment & Plan Note (Signed)
Physical as above, forms filled out for school.

## 2016-05-02 LAB — MEASLES/MUMPS/RUBELLA IMMUNITY
Mumps IgG: 118 AU/mL — ABNORMAL HIGH (ref <9.00)
Rubella: 23.2 Index — ABNORMAL HIGH (ref <0.90)
Rubeola IgG: 63.3 AU/mL — ABNORMAL HIGH (ref <25.00)

## 2016-05-02 LAB — LIPID PANEL
Cholesterol: 159 mg/dL (ref 125–200)
HDL: 64 mg/dL (ref >=46)
LDL Cholesterol: 65 mg/dL (ref <130)
Total CHOL/HDL Ratio: 2.5 ratio (ref <=5.0)
Triglycerides: 152 mg/dL — ABNORMAL HIGH (ref <150)
VLDL: 30 mg/dL (ref <30)

## 2016-05-02 LAB — HEMOGLOBIN A1C
Hgb A1c MFr Bld: 5.6 % (ref <5.7)
Mean Plasma Glucose: 114 mg/dL

## 2016-05-02 LAB — VITAMIN D 25 HYDROXY (VIT D DEFICIENCY, FRACTURES): Vit D, 25-Hydroxy: 30 ng/mL (ref 30–100)

## 2016-05-02 LAB — VARICELLA ZOSTER ANTIBODY, IGG: Varicella IgG: 1064 {index} — ABNORMAL HIGH (ref <135.00)

## 2016-05-02 LAB — HEPATITIS B SURFACE ANTIBODY, QUANTITATIVE: Hep B S AB Quant (Post): 0 m[IU]/mL

## 2016-05-03 DIAGNOSIS — Z789 Other specified health status: Secondary | ICD-10-CM | POA: Insufficient documentation

## 2016-05-10 ENCOUNTER — Other Ambulatory Visit: Payer: Self-pay | Admitting: Urology

## 2016-05-11 ENCOUNTER — Ambulatory Visit (INDEPENDENT_AMBULATORY_CARE_PROVIDER_SITE_OTHER): Payer: 59 | Admitting: Sports Medicine

## 2016-05-11 VITALS — BP 120/87 | HR 78 | Temp 98.1°F | Wt 168.0 lb

## 2016-05-11 DIAGNOSIS — Z23 Encounter for immunization: Secondary | ICD-10-CM | POA: Diagnosis not present

## 2016-05-11 DIAGNOSIS — Z789 Other specified health status: Secondary | ICD-10-CM

## 2016-05-11 DIAGNOSIS — B1910 Unspecified viral hepatitis B without hepatic coma: Secondary | ICD-10-CM | POA: Diagnosis not present

## 2016-05-11 NOTE — Progress Notes (Signed)
Patient came into clinic today to begin Hep B series. 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 Pt tolerated injection in left deltoid well, no immediate complications. Pt advised to schedule her second injection in one month and her final injection 6 months from now.

## 2016-05-15 ENCOUNTER — Encounter (HOSPITAL_COMMUNITY): Payer: Self-pay | Admitting: *Deleted

## 2016-05-15 ENCOUNTER — Other Ambulatory Visit: Payer: Self-pay | Admitting: Urology

## 2016-05-17 ENCOUNTER — Ambulatory Visit (HOSPITAL_COMMUNITY): Payer: 59

## 2016-05-17 ENCOUNTER — Ambulatory Visit (HOSPITAL_COMMUNITY)
Admission: RE | Admit: 2016-05-17 | Discharge: 2016-05-17 | Disposition: A | Discharge status: Home / Self Care | Payer: 59 | Source: Ambulatory Visit | Attending: Urology | Admitting: Urology

## 2016-05-17 ENCOUNTER — Encounter (HOSPITAL_COMMUNITY)
Admission: RE | Disposition: A | Discharge status: Home / Self Care | Payer: Self-pay | Source: Ambulatory Visit | Attending: Urology

## 2016-05-17 DIAGNOSIS — N200 Calculus of kidney: Secondary | ICD-10-CM | POA: Insufficient documentation

## 2016-05-17 DIAGNOSIS — Z791 Long term (current) use of non-steroidal anti-inflammatories (NSAID): Secondary | ICD-10-CM | POA: Diagnosis not present

## 2016-05-17 DIAGNOSIS — Z79899 Other long term (current) drug therapy: Secondary | ICD-10-CM | POA: Insufficient documentation

## 2016-05-17 LAB — PREGNANCY, URINE: Preg Test, Ur: NEGATIVE

## 2016-05-17 SURGERY — LITHOTRIPSY, ESWL
Anesthesia: LOCAL | Laterality: Right

## 2016-05-17 MED ORDER — SODIUM CHLORIDE 0.9 % IV SOLN
INTRAVENOUS | Status: DC
Start: 2016-05-17 — End: 2016-05-17

## 2016-05-17 MED ORDER — DIPHENHYDRAMINE HCL 25 MG PO CAPS
ORAL_CAPSULE | ORAL | Status: AC
  Filled 2016-05-17: qty 1

## 2016-05-17 MED ORDER — CIPROFLOXACIN HCL 500 MG PO TABS
500.0000 mg | ORAL_TABLET | ORAL | Status: AC
  Administered 2016-05-17: 500 mg via ORAL
  Filled 2016-05-17: qty 1

## 2016-05-17 MED ORDER — DIAZEPAM 5 MG PO TABS
10.0000 mg | ORAL_TABLET | ORAL | Status: AC
  Administered 2016-05-17: 10 mg via ORAL
  Filled 2016-05-17: qty 2

## 2016-05-17 MED ORDER — DIPHENHYDRAMINE HCL 25 MG PO TABS
25.0000 mg | ORAL_TABLET | Freq: Once | ORAL | Status: DC
  Filled 2016-05-17: qty 1

## 2016-05-17 MED ORDER — DIPHENHYDRAMINE HCL 25 MG PO CAPS
25.0000 mg | ORAL_CAPSULE | Freq: Once | ORAL | Status: DC
  Filled 2016-05-17: qty 1

## 2016-05-17 NOTE — Progress Notes (Signed)
Pt adamantly denies any intolerance or allergy to Benadryl. This was reported to Dr Gaynelle Arabian and Pura Spice 25 mg was reordered preprocedure for her ESWL

## 2016-05-17 NOTE — Discharge Instructions (Signed)
Dietary Guidelines to Help Prevent Kidney Stones Your risk of kidney stones can be decreased by adjusting the foods you eat. The most important thing you can do is drink enough fluid. You should drink enough fluid to keep your urine clear or pale yellow. The following guidelines provide specific information for the type of kidney stone you have had. GUIDELINES ACCORDING TO TYPE OF KIDNEY STONE Calcium Oxalate Kidney Stones  Reduce the amount of salt you eat. Foods that have a lot of salt cause your body to release excess calcium into your urine. The excess calcium can combine with a substance called oxalate to form kidney stones.  Reduce the amount of animal protein you eat if the amount you eat is excessive. Animal protein causes your body to release excess calcium into your urine. Ask your dietitian how much protein from animal sources you should be eating.  Avoid foods that are high in oxalates. If you take vitamins, they should have less than 500 mg of vitamin C. Your body turns vitamin C into oxalates. You do not need to avoid fruits and vegetables high in vitamin C. Calcium Phosphate Kidney Stones  Reduce the amount of salt you eat to help prevent the release of excess calcium into your urine.  Reduce the amount of animal protein you eat if the amount you eat is excessive. Animal protein causes your body to release excess calcium into your urine. Ask your dietitian how much protein from animal sources you should be eating.  Get enough calcium from food or take a calcium supplement (ask your dietitian for recommendations). Food sources of calcium that do not increase your risk of kidney stones include:  Broccoli.  Dairy products, such as cheese and yogurt.  Pudding. Uric Acid Kidney Stones  Do not have more than 6 oz of animal protein per day. FOOD SOURCES Animal Protein Sources  Meat (all types).  Poultry.  Eggs.  Fish, seafood. Foods High in Salt  Salt seasonings.  Soy  sauce.  Teriyaki sauce.  Cured and processed meats.  Salted crackers and snack foods.  Fast food.  Canned soups and most canned foods. Foods High in Oxalates  Grains:  Amaranth.  Barley.  Grits.  Wheat germ.  Bran.  Buckwheat flour.  All bran cereals.  Pretzels.  Whole wheat bread.  Vegetables:  Beans (wax).  Beets and beet greens.  Collard greens.  Eggplant.  [place]ole.  Leeks.  Okra.  Parsley.  Rutabagas.  Spinach.  Swiss chard.  Tomato paste.  Fried potatoes.  Sweet potatoes.  Fruits:  Red currants.  Figs.  Kiwi.  Rhubarb.  Meat and Other Protein Sources:  Beans (dried).  Soy burgers and other soybean products.  Miso.  Nuts (peanuts, almonds, pecans, cashews, hazelnuts).  Nut butters.  Sesame seeds and tahini (paste made of sesame seeds).  Poppy seeds.  Beverages:  Chocolate drink mixes.  Soy milk.  Instant iced tea.  Juices made from high-oxalate fruits or vegetables.  Other:  Carob.  Chocolate.  Fruitcake.  Marmalades.   This information is not intended to replace advice given to you by your health care provider. Make sure you discuss any questions you have with your health care provider.   Document Released: 02/02/2011 Document Revised: 10/13/2013 Document Reviewed: 09/04/2013 [employer] Interactive Patient Education 2016 [employer]  

## 2016-05-17 NOTE — H&P (Signed)
Office Visit Report     05/03/2016   --------------------------------------------------------------------------------   Charlotte Nelson  MRN: [id number]  PRIMARY CARE:  Gwen Her. Dianah Field, MD  DOB: July 13, 1981, 35 year old Female old Female  REFERRING:  Gwen Her. Dianah Field, MD  FN:[id number]  PROVIDER:  Carolan Clines, M.D.    TREATING:  Jiles Crocker    LOCATION:  [facility] [phone]   --------------------------------------------------------------------------------   CC: I have kidney stones.(Surgery)  HPI: Charlotte Nelson is a 35 year-old female established patient who is here for renal calculi after a surgical intervention.  This patient underwent lithotripsy on 6/15. She returns today for postoperative KUB to check for stone clearance.   She is doing well. She has passed quite a lot of stone fragments per subjective count. No bothersome pain or LUTS. Remains afebrile.   Her last stone analysis was 85% CaOx. 15% apatite.  24 hour UA noted 1.44 L/day out put and moderately elevated urine calcium. Full report can be reviewed in the documents section.    The problem is on the left side. She had eswl for treatment of her renal calculi. Patient denies stent, ureteroscopy, and percutaneous lithotomy. This procedure was done 04/05/2016. She did pass stone fragments. She does not have a stent in place.     CC/HPI: I am here for a post operative visit.    ALLERGIES: CeleXA TABS Cymbalta    MEDICATIONS: Flomax 0.4 mg capsule, ext release 24 hr  Meloxicam 7.5 mg tablet  Probiotic CAPS Oral  Propranolol HCl - 20 MG Oral Tablet Oral  Trintellix 1 PO Daily  Tri-Sprintec 0.18 mg-35 mcg (7)/0.215 mg-35 mcg (7)/0.25 mg-35 mcg(7)(28) tablet  Xiidra 5 % Ophthalmic Solution Ophthalmic     GU PSH: Cystoscopy Insert Stent - 09/22/2015 Renal ESWL, Left - 04/05/2016, 09/22/2015      PSH Notes: Cystoscopy With Insertion Of Ureteral Stent Left, Renal Lithotripsy, Oral Surgery    NON-GU PSH: None   GU PMH: Kidney Stone, Nephrolithiasis - 03/23/2016 Other microscopic hematuria, Microscopic hematuria - [id number] Renal Colic, Renal colic - 0000000    NON-GU PMH: Encounter for general adult medical examination without abnormal findings, Encounter for preventive health examination - 01/04/2016 Other constipation, Chronic constipation - 09/26/2015 Anxiety disorder, unspecified, Anxiety - 09/05/2015 Personal history of other diseases of the digestive system, History of esophageal reflux - 05/20/2015    FAMILY HISTORY: Diabetes - Runs In Family Hypothyroidism - Runs In Family Kidney Stones - Runs In Family Prostate Cancer - Runs In Family   SOCIAL HISTORY: Marital Status: Married Current Smoking Status: Patient has never smoked.  Drinks 1 caffeinated drink per day. Patient's occupation Midwife.     Notes: Caffeine use, Student, Never a smoker, Alcohol use, Married   REVIEW OF SYSTEMS:    GU Review Female:   Patient denies frequent urination, hard to postpone urination, burning /pain with urination, get up at night to urinate, leakage of urine, stream starts and stops, trouble starting your stream, have to strain to urinate, and currently pregnant.  Gastrointestinal (Upper):   Patient denies nausea, vomiting, and indigestion/ heartburn.  Gastrointestinal (Lower):   Patient denies constipation and diarrhea.  Constitutional:   Patient denies fever, night sweats, weight loss, and fatigue.  Skin:   Patient denies skin rash/ lesion and itching.  Eyes:   Patient denies blurred vision and double vision.  Ears/ Nose/ Throat:   Patient denies sore throat and sinus problems.  Hematologic/Lymphatic:   Patient denies swollen glands  and easy bruising.  Cardiovascular:   Patient denies leg swelling and chest pains.  Respiratory:   Patient denies cough and shortness of breath.  Endocrine:   Patient denies excessive thirst.  Musculoskeletal:    Patient denies back pain and joint pain.  Neurological:   Patient denies headaches and dizziness.  Psychologic:   Patient denies depression and anxiety.   VITAL SIGNS:      05/03/2016 08:07 AM  BP 119/82 mmHg  Pulse 88 /min  Temperature 97.8 F / 37 C   MULTI-SYSTEM PHYSICAL EXAMINATION:    Constitutional: Well-nourished. No physical deformities. Normally developed. Good grooming.  Respiratory: No labored breathing, no use of accessory muscles.   Cardiovascular: Normal temperature, normal extremity pulses, no swelling, no varicosities.  Skin: No paleness, no jaundice, no cyanosis. No lesion, no ulcer, no rash.  Neurologic / Psychiatric: Oriented to time, oriented to place, oriented to person. No depression, no anxiety, no agitation.  Gastrointestinal: No mass, no tenderness, no rigidity, non obese abdomen.  Musculoskeletal: Spine, ribs, pelvis no bilateral tenderness. Normal gait and station of head and neck.     PAST DATA REVIEWED:  Source Of History:  Patient  Records Review:   Previous Patient Records  Urine Test Review:   Urinalysis, 24 Hour Urine  X-Ray Review: KUB: Reviewed Films.     05/03/16  Urinalysis  Urine Appearance Clear   Urine Specimen Voided   Urine Color Yellow   Urine Glucose Neg   Urine Bilirubin Neg   Urine Ketones Neg   Urine Specific Gravity 1.025   Urine Blood Neg   Urine pH 5.5   Urine Protein Neg   Urine Urobilinogen 0.2   Urine Nitrites Neg   Urine Leukocyte Esterase Neg    PROCEDURES:         KUB - 74000  A single view of the abdomen is obtained.  Bony Abnormalities:  Pelvic blasts.      The mid pole calculi on the left appears clear. There remains a calculi in the upper left and lower left poles.  There is a larger stone (7.2 mm) on the right mid-lower pole. Both ureters appear clear.         Urinalysis - 81003 Dipstick Dipstick Cont'd  Specimen: Voided Bilirubin: Neg  Color: Yellow Ketones: Neg  Appearance: Clear Blood: Neg   Specific Gravity: 1.025 Protein: Neg  pH: 5.5 Urobilinogen: 0.2  Glucose: Neg Nitrites: Neg    Leukocyte Esterase: Neg    ASSESSMENT:      ICD-10 Details  1 GU:   Kidney Stone - N20.0   2 NON-GU:   Other specified postprocedural states - Z98.89    PLAN:           Schedule Return Visit: Keep Scheduled Appointment - Schedule Surgery          Document Letter(s):  Created for Patient: Clinical Summary         Notes:   I'll arrange for her to have ESWL on the right for the noted mid pole calculi there. Reminders given to remain well hydrated and monitor herself that she doesn't develop constipation.    Also discussed her 24 hour UA. At this point I want her to focus on increasing hydration, limiting animal protein and dietary oxylate. We may need to add a thiazide diuretic but I want to see her next 24 hour UA first.    Signed by Jiles Crocker on 05/03/16 at 9:26 AM (EDT)  The information contained in this medical record document is considered private and confidential patient information. This information can only be used for the medical diagnosis and/or medical services that are being provided by the patient's selected caregivers. This information can only be distributed outside of the patient's care if the patient agrees and signs waivers of authorization for this information to be sent to an outside source or route.

## 2016-06-06 ENCOUNTER — Telehealth: Payer: Self-pay

## 2016-06-06 NOTE — Telephone Encounter (Signed)
May certainly come to let me look at them, if I think the wound has closed appropriately then I will remove them, if not we can consider putting Dermabond.

## 2016-06-06 NOTE — Telephone Encounter (Signed)
Pt recently received sutures but is going out of town a day before it's due to come out. Would like to know if she can come in Friday to have removed. Please advise.

## 2016-06-06 NOTE — Telephone Encounter (Signed)
Left detailed message with information.  

## 2016-06-08 ENCOUNTER — Ambulatory Visit (INDEPENDENT_AMBULATORY_CARE_PROVIDER_SITE_OTHER): Payer: 59 | Admitting: Sports Medicine

## 2016-06-08 DIAGNOSIS — S61411D Laceration without foreign body of right hand, subsequent encounter: Secondary | ICD-10-CM

## 2016-06-08 DIAGNOSIS — S61411A Laceration without foreign body of right hand, initial encounter: Secondary | ICD-10-CM | POA: Insufficient documentation

## 2016-06-08 NOTE — Assessment & Plan Note (Signed)
Sutured at an outside facility, incision looks good, sutures removed and Dermabond applied. Return as needed.
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
## 2016-06-12 ENCOUNTER — Ambulatory Visit (INDEPENDENT_AMBULATORY_CARE_PROVIDER_SITE_OTHER): Payer: 59 | Admitting: Sports Medicine

## 2016-06-12 ENCOUNTER — Inpatient Hospital Stay: Payer: 59 | Admitting: Sports Medicine

## 2016-06-12 VITALS — BP 121/72 | HR 82 | Temp 98.7°F

## 2016-06-12 DIAGNOSIS — Z23 Encounter for immunization: Secondary | ICD-10-CM | POA: Diagnosis not present

## 2016-06-12 NOTE — Progress Notes (Signed)
   Subjective:    Patient ID: Charlotte Nelson, female    DOB: 1981/02/04, 35 y.o.   MRN: [id number]  HPI  Charlotte Nelson is here for 2 nd hep B vaccine and flu vaccine.  Review of Systems     Objective:   Physical Exam        Assessment & Plan:  Patient tolerated injection well without complications. Patient advised to return 6 months after the first injection around 11/12/2015.

## 2016-06-19 ENCOUNTER — Ambulatory Visit (HOSPITAL_BASED_OUTPATIENT_CLINIC_OR_DEPARTMENT_OTHER)
Admission: RE | Admit: 2016-06-19 | Discharge: 2016-06-19 | Disposition: A | Discharge status: Home / Self Care | Payer: 59 | Source: Ambulatory Visit | Attending: Sports Medicine | Admitting: Sports Medicine

## 2016-06-19 ENCOUNTER — Ambulatory Visit (INDEPENDENT_AMBULATORY_CARE_PROVIDER_SITE_OTHER): Payer: 59 | Admitting: Sports Medicine

## 2016-06-19 DIAGNOSIS — R1031 Right lower quadrant pain: Secondary | ICD-10-CM

## 2016-06-19 DIAGNOSIS — N200 Calculus of kidney: Secondary | ICD-10-CM | POA: Diagnosis not present

## 2016-06-19 LAB — POCT URINALYSIS DIPSTICK
Bilirubin, UA: NEGATIVE
Glucose, UA: NEGATIVE
Ketones, UA: NEGATIVE
Nitrite, UA: NEGATIVE
Protein, UA: NEGATIVE
Spec Grav, UA: 1.025
Urobilinogen, UA: 0.2
pH, UA: 6

## 2016-06-19 LAB — COMPREHENSIVE METABOLIC PANEL
ALT: 17 U/L (ref 6–29)
Albumin: 4.1 g/dL (ref 3.6–5.1)
Alkaline Phosphatase: 64 U/L (ref 33–115)
BUN: 13 mg/dL (ref 7–25)
Calcium: 9.3 mg/dL (ref 8.6–10.2)
Chloride: 106 mmol/L (ref 98–110)
Potassium: 3.9 mmol/L (ref 3.5–5.3)
Total Protein: 7.2 g/dL (ref 6.1–8.1)

## 2016-06-19 LAB — COMPREHENSIVE METABOLIC PANEL WITH GFR
AST: 15 U/L (ref 10–30)
CO2: 26 mmol/L (ref 20–31)
Creat: 0.79 mg/dL (ref 0.50–1.10)
Glucose, Bld: 96 mg/dL (ref 65–99)
Sodium: 137 mmol/L (ref 135–146)
Total Bilirubin: 0.5 mg/dL (ref 0.2–1.2)

## 2016-06-19 LAB — AMYLASE: Amylase: 64 U/L (ref 0–105)

## 2016-06-19 LAB — CBC WITH DIFFERENTIAL/PLATELET
Basophils Absolute: 0 cells/uL (ref 0–200)
Basophils Relative: 0 %
Eosinophils Absolute: 285 cells/uL (ref 15–500)
Eosinophils Relative: 3 %
HCT: 40.3 % (ref 35.0–45.0)
Hemoglobin: 13.3 g/dL (ref 11.7–15.5)
Lymphocytes Relative: 33 %
Lymphs Abs: 3135 {cells}/uL (ref 850–3900)
MCH: 25.6 pg — ABNORMAL LOW (ref 27.0–33.0)
MCHC: 33 g/dL (ref 32.0–36.0)
MCV: 77.6 fL — ABNORMAL LOW (ref 80.0–100.0)
MPV: 10.3 fL (ref 7.5–12.5)
Monocytes Absolute: 570 cells/uL (ref 200–950)
Monocytes Relative: 6 %
Neutro Abs: 5510 cells/uL (ref 1500–7800)
Neutrophils Relative %: 58 %
Platelets: 340 10*3/uL (ref 140–400)
RBC: 5.19 MIL/uL — ABNORMAL HIGH (ref 3.80–5.10)
RDW: 13.4 % (ref 11.0–15.0)
WBC: 9.5 K/uL (ref 3.8–10.8)

## 2016-06-19 LAB — LIPASE: Lipase: 35 U/L (ref 7–60)

## 2016-06-19 MED ORDER — IOPAMIDOL (ISOVUE-300) INJECTION 61%
100.0000 mL | Freq: Once | INTRAVENOUS | Status: AC | PRN
  Administered 2016-06-19: 100 mL via INTRAVENOUS

## 2016-06-19 MED ORDER — CIPROFLOXACIN HCL 750 MG PO TABS: 750.0000 mg | ORAL_TABLET | Freq: Two times a day (BID) | ORAL | 0 refills | Status: AC

## 2016-06-19 NOTE — Assessment & Plan Note (Signed)
With severe pain in the right lower quadrant. Urinalysis positive leukocytes.  Likely represents acute cystitis however with pain and guarding at McBurney's point we are going to proceed with a CT of the abdomen and pelvis with oral and IV contrast. We will also get a CBC with differential and a CMP, lipase, amylase.

## 2016-06-19 NOTE — Progress Notes (Signed)
  Subjective:    CC: Right lower quadrant pain  HPI: This is a pleasant 35 year old female, for the past several days she's had increasing pain in her right lower quadrant, minimal difficulty voiding. Several weeks ago she had a lithotripsy, no constitutional symptoms, no gross hematuria. Moderate, persistent.  Past medical history, Surgical history, Family history not pertinant except as noted below, Social history, Allergies, and medications have been entered into the medical record, reviewed, and no changes needed.   Review of Systems: No fevers, chills, night sweats, weight loss, chest pain, or shortness of breath.   Objective:    General: Well Developed, well nourished, and in no acute distress.  Neuro: Alert and oriented x3, extra-ocular muscles intact, sensation grossly intact.  HEENT: Normocephalic, atraumatic, pupils equal round reactive to light, neck supple, no masses, no lymphadenopathy, thyroid nonpalpable.  Skin: Warm and dry, no rashes. Cardiac: Regular rate and rhythm, no murmurs rubs or gallops, no lower extremity edema.  Respiratory: Clear to auscultation bilaterally. Not using accessory muscles, speaking in full sentences. Abdomen: Soft, tender palpation at McBurney's point, there is guarding and rigidity with palpation. No rebound tenderness. Only minimal pain in the superpubic region and no costovertebral angle pain.  Urinalysis shows trace blood and leukocytes  Impression and Recommendations:    Acute abdominal pain in right lower quadrant With severe pain in the right lower quadrant. Urinalysis positive leukocytes.  Likely represents acute cystitis however with pain and guarding at McBurney's point we are going to proceed with a CT of the abdomen and pelvis with oral and IV contrast. We will also get a CBC with differential and a CMP, lipase, amylase.  I spent 25 minutes with this patient, greater than 50% was face-to-face time counseling regarding the above  diagnoses

## 2016-06-20 ENCOUNTER — Telehealth: Payer: Self-pay | Admitting: Sports Medicine

## 2016-06-20 ENCOUNTER — Ambulatory Visit (HOSPITAL_BASED_OUTPATIENT_CLINIC_OR_DEPARTMENT_OTHER): Payer: 59

## 2016-06-20 MED ORDER — ONDANSETRON 8 MG PO TBDP: 8.0000 mg | ORAL_TABLET | Freq: Three times a day (TID) | ORAL | 3 refills | Status: DC | PRN

## 2016-06-20 NOTE — Telephone Encounter (Signed)
No problem, sending in Zofran.

## 2016-06-20 NOTE — Telephone Encounter (Signed)
Pt called clinic today stating the imaging contrast paired with new antibiotic are making her very nauseous. Pt states she is working today and does not want to leave. Request something be sent in for nausea that's non drowsy. Pt would like this Rx to go to [facility] on [address]. pharmacy updated in chart. Will route.

## 2016-07-03 ENCOUNTER — Ambulatory Visit: Payer: 59 | Admitting: Sports Medicine

## 2016-10-26 ENCOUNTER — Telehealth: Payer: Self-pay

## 2016-10-26 DIAGNOSIS — Z111 Encounter for screening for respiratory tuberculosis: Secondary | ICD-10-CM

## 2016-10-26 NOTE — Telephone Encounter (Signed)
Charlotte Nelson called and would like a Quantiferon tb gold lab test. She needs this test for school. Please advise.

## 2016-10-26 NOTE — Telephone Encounter (Signed)
Lab ordered.

## 2016-10-29 ENCOUNTER — Telehealth: Payer: Self-pay | Admitting: *Deleted

## 2016-10-29 ENCOUNTER — Ambulatory Visit (INDEPENDENT_AMBULATORY_CARE_PROVIDER_SITE_OTHER): Payer: 59 | Admitting: Sports Medicine

## 2016-10-29 VITALS — BP 126/87 | HR 83 | Temp 98.7°F | Wt 164.0 lb

## 2016-10-29 DIAGNOSIS — Z87440 Personal history of urinary (tract) infections: Secondary | ICD-10-CM

## 2016-10-29 DIAGNOSIS — M545 Low back pain, unspecified: Secondary | ICD-10-CM

## 2016-10-29 DIAGNOSIS — N200 Calculus of kidney: Secondary | ICD-10-CM | POA: Diagnosis not present

## 2016-10-29 LAB — POCT URINALYSIS DIPSTICK
Bilirubin, UA: NEGATIVE
Glucose, UA: NEGATIVE
Leukocytes, UA: NEGATIVE
Nitrite, UA: NEGATIVE
Spec Grav, UA: 1.02
Urobilinogen, UA: 0.2
pH, UA: 6

## 2016-10-29 MED ORDER — CIPROFLOXACIN HCL 750 MG PO TABS: 750.0000 mg | ORAL_TABLET | Freq: Two times a day (BID) | ORAL | 0 refills | Status: AC

## 2016-10-29 NOTE — Telephone Encounter (Signed)
Left message on patients vm

## 2016-10-29 NOTE — Progress Notes (Signed)
Patient states she has had lower back pain since the end of last week. She also states she has had some pelvic pain. Patient denies nausea, vomiting, fever or abdominal pain or cramping. Patient states today the pain in her lower back has become worse and is a throbbing pain. Patient states she has a history of kidney infections.  Will order u/a and culture. Routing to provider

## 2016-10-29 NOTE — Telephone Encounter (Signed)
Patient called and states she has a kidney infection. She states she is pain and is in class all day and cannot come in for an appointment. She wants an antibiotic called in. She states she is familiar with the pain and Dr. Dianah Field has treated her for this in the past. I told her office policy is that for a possible kidney infection she would need to come into the office for U/A. We do not just call in antibiotics for this. Patient states she cannot leave class and she knows that she has an kidney infection.   Spoke with Dr. Dianah Field and he states we would need a urine sample. He did say patient could come in after class and for nurse visit. Called patient back and she states she is in class. Scheduled her for 1030.

## 2016-10-29 NOTE — Telephone Encounter (Signed)
-----   Message from Silverio Decamp, MD sent at 10/29/2016  1:07 PM EST ----- Sent in Cipro.

## 2016-10-29 NOTE — Assessment & Plan Note (Signed)
Set symptoms consistent with pyelonephritis with blood in the urine, adding ciprofloxacin, further follow-up with urologist. Awaiting urine culture.

## 2016-10-31 LAB — URINE CULTURE: Organism ID, Bacteria: NO GROWTH

## 2016-11-06 DIAGNOSIS — Z01419 Encounter for gynecological examination (general) (routine) without abnormal findings: Secondary | ICD-10-CM | POA: Diagnosis not present

## 2016-11-16 ENCOUNTER — Ambulatory Visit (INDEPENDENT_AMBULATORY_CARE_PROVIDER_SITE_OTHER): Payer: 59 | Admitting: Sports Medicine

## 2016-11-16 VITALS — BP 114/76 | HR 84 | Temp 98.1°F

## 2016-11-16 DIAGNOSIS — Z23 Encounter for immunization: Secondary | ICD-10-CM

## 2016-11-16 NOTE — Progress Notes (Signed)
Patient came into clinic today for 3rd and final hep b injection. Patient also is going to lab for completion of Quantiferon tb gold. Both of these are required for work. Patient reports no negative side effects from prior immunizations and tolerated injection in right deltoid well. No immediate complications. Updated immunization record printed. No further questions.

## 2016-11-17 LAB — QUANTIFERON TB GOLD ASSAY (BLOOD)
Interferon Gamma Release Assay: NEGATIVE
Mitogen-Nil: 8.42 IU/mL
Quantiferon Nil Value: 0.01 IU/mL
Quantiferon Tb Ag Minus Nil Value: 0 IU/mL

## 2016-11-18 IMAGING — CT CT ABD-PELV W/ CM
2 of 4 series · 16 of 46 positions shown, 18 images · IV contrast (APPLIED)
Comparison: 08/11/2015.

CLINICAL DATA: One-day history of right lower quadrant pain.

EXAM:
CT ABDOMEN AND PELVIS WITH CONTRAST
TECHNIQUE: Multidetector CT imaging of the abdomen and pelvis was performed
using the standard protocol following bolus administration of
intravenous contrast.
CONTRAST:  100mL B50QX4-Y88 IOPAMIDOL ([id number]) INJECTION 61%

[Series 2: axial st · axial · 0.97mm/px · z∈[+695,+1115]mm · 13 of 92 slices shown, 15 images]
[im 4/92  soft-tissue]
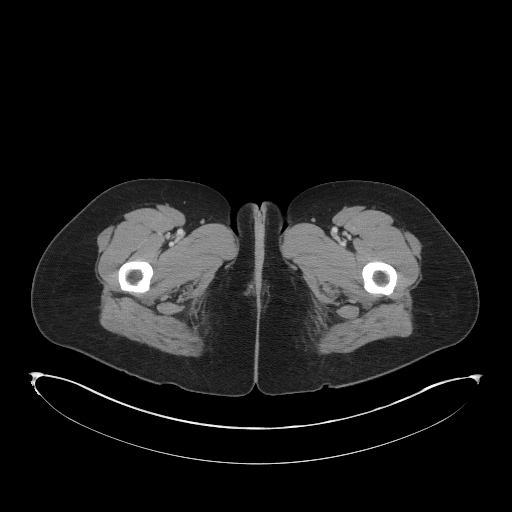
[im 4/92  bone]
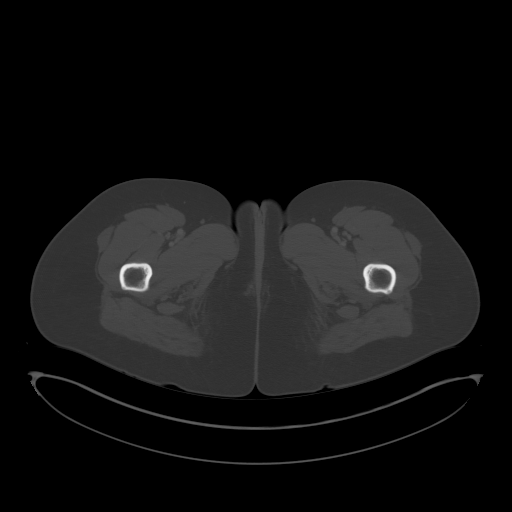
[im 12/92  soft-tissue]
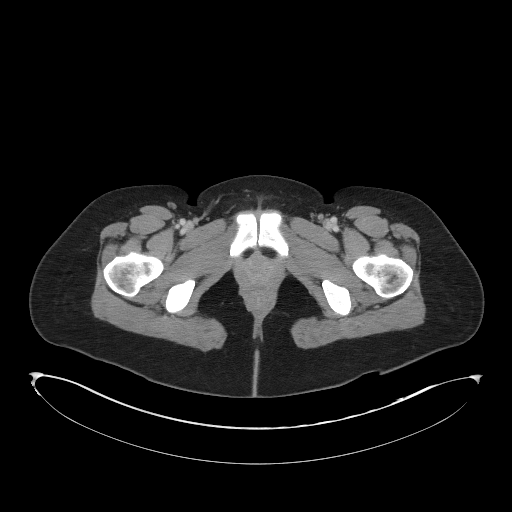
[im 19/92  soft-tissue]
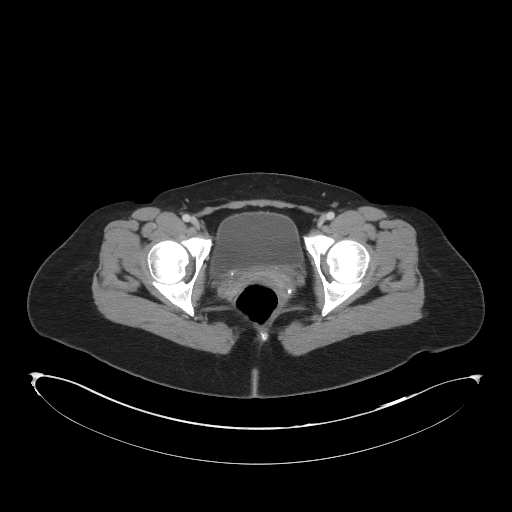
[im 27/92  soft-tissue]
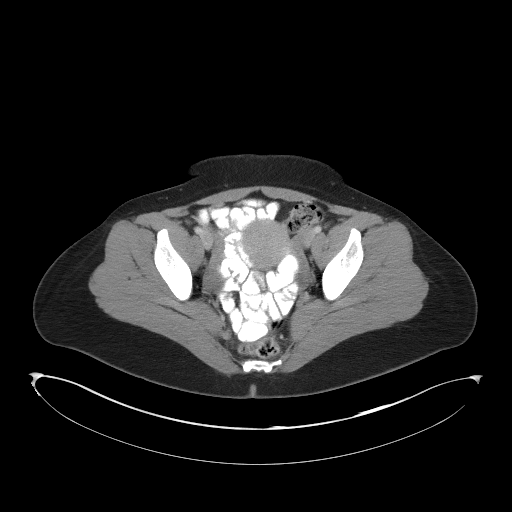
[im 31/92  soft-tissue]
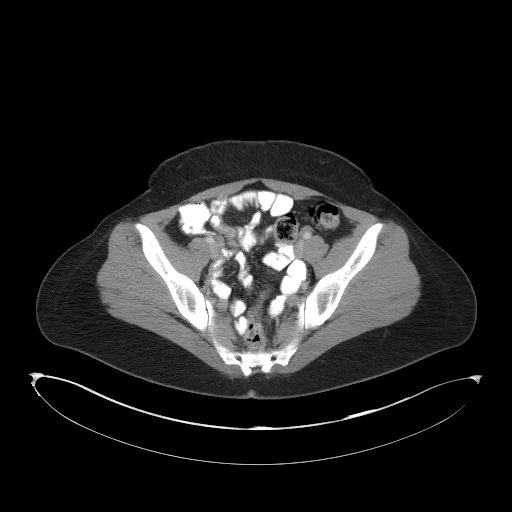
[im 38/92  soft-tissue]
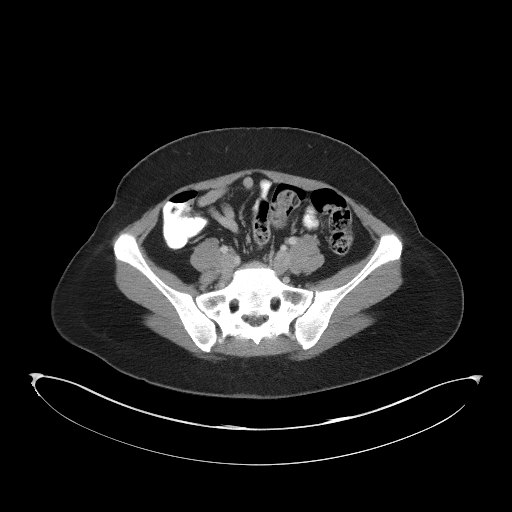
[im 46/92  soft-tissue]
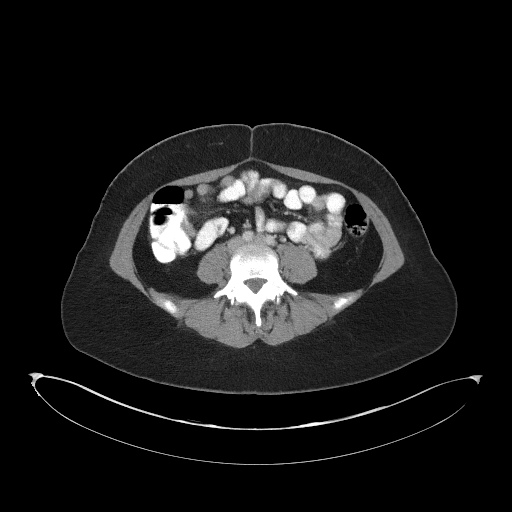
[im 54/92  soft-tissue]
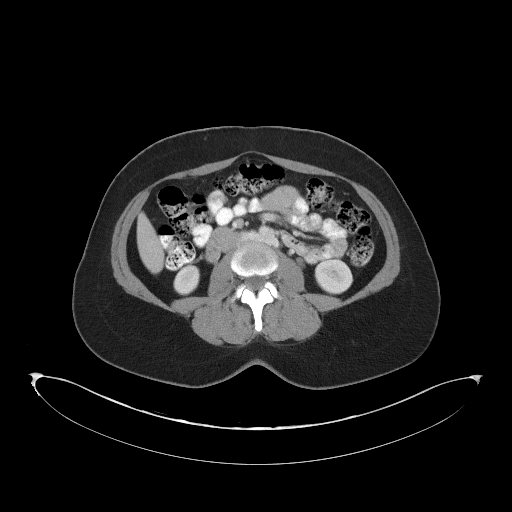
[im 61/92  soft-tissue]
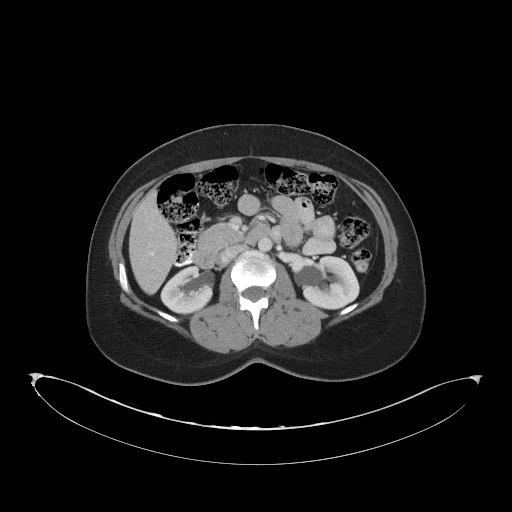
[im 61/92  bone]
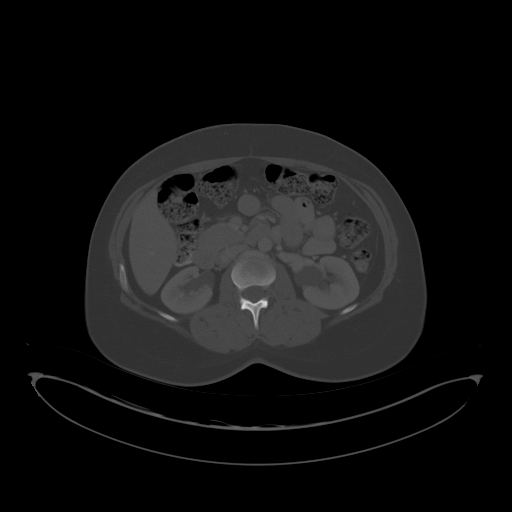
[im 65/92  soft-tissue]
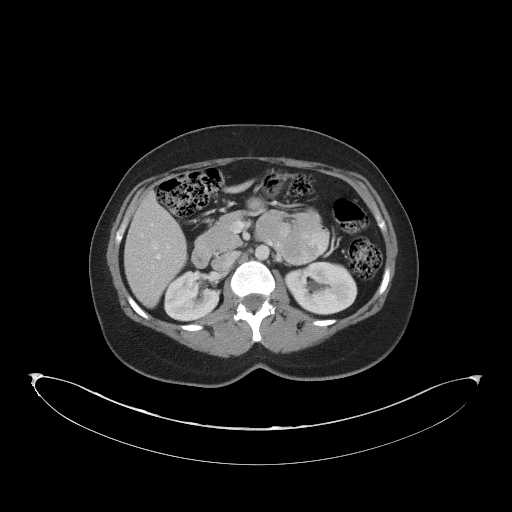
[im 73/92  soft-tissue]
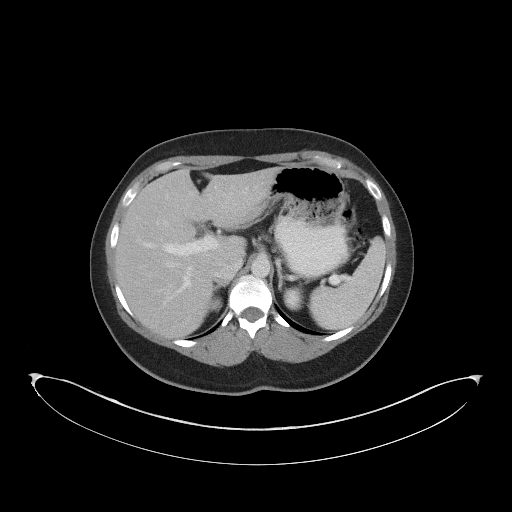
[im 80/92  soft-tissue]
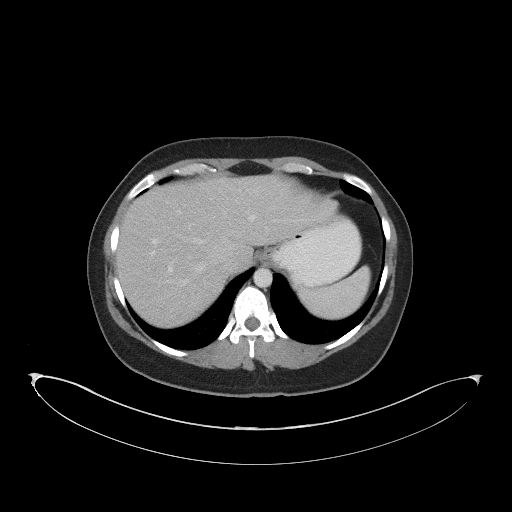
[im 88/92  soft-tissue]
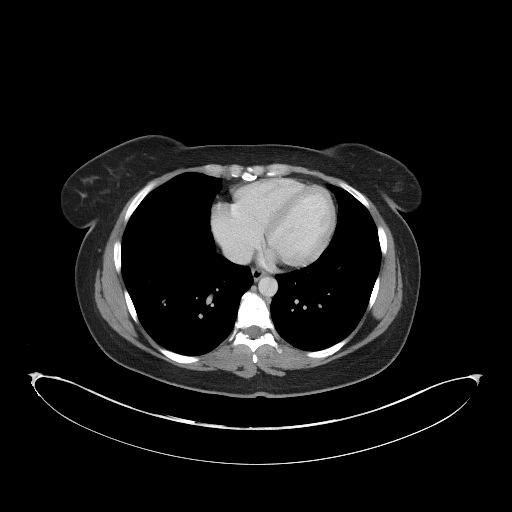

[Series 5: coronal st · coronal · 0.97mm/px · 3 of 83 slices shown]
[im 28/83  soft-tissue]
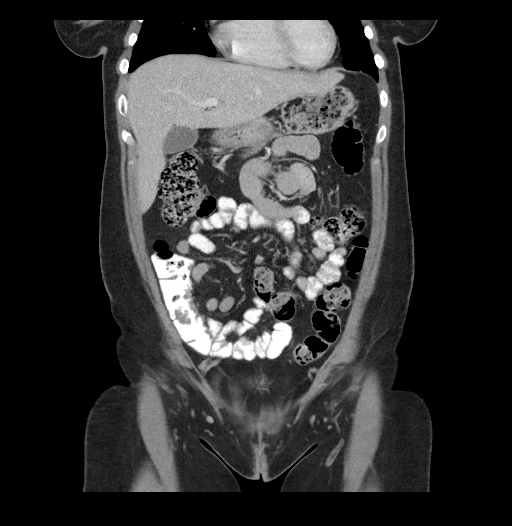
[im 37/83  soft-tissue]
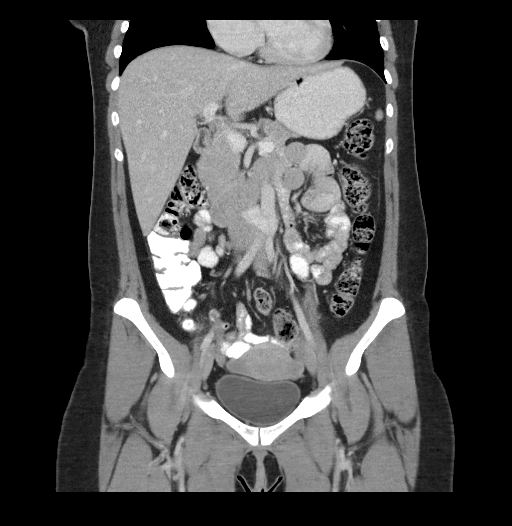
[im 46/83  soft-tissue]
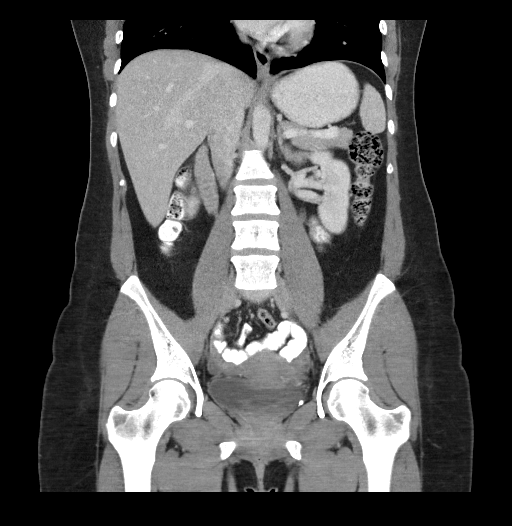

[16 of 46 positions shown; findings below may reference images not displayed]

FINDINGS: Lower chest:  Unremarkable.

Hepatobiliary: No focal abnormality within the liver parenchyma.
There is no evidence for gallstones, gallbladder wall thickening, or
pericholecystic fluid. No intrahepatic or extrahepatic biliary
dilation.

Pancreas: No focal mass lesion. No dilatation of the main duct. No
intraparenchymal cyst. No peripancreatic edema.

Spleen: No splenomegaly. No focal mass lesion.

Adrenals/Urinary Tract: No adrenal nodule or mass. Scattered tiny
bilateral renal stones are identified, measuring up to 4 mm in the
right kidney and 7 mm in the left kidney. No definite ureteral
stones. No hydroureter. The urinary bladder appears normal for the
degree of distention.

Stomach/Bowel: Stomach is nondistended. No gastric wall thickening.
No evidence of outlet obstruction. Duodenum is normally positioned
as is the ligament of Treitz. No small bowel wall thickening. No
small bowel dilatation. The terminal ileum is normal. The appendix
is normal. No gross colonic mass. No colonic wall thickening. No
substantial diverticular change.

Vascular/Lymphatic: No abdominal aortic aneurysm. No abdominal
aortic atherosclerotic calcification. There is no gastrohepatic or
hepatoduodenal ligament lymphadenopathy. No intraperitoneal or
retroperitoneal lymphadenopathy. No pelvic sidewall lymphadenopathy.

Reproductive: Insert normally uterus There is no adnexal mass.

Other: No intraperitoneal free fluid.

Musculoskeletal: No CT findings to explain the patient's history of
pain.
IMPRESSION: No acute findings in the abdomen or pelvis. Specifically, no
evidence to explain the patient's history of right lower quadrant
pain. Terminal ileum and appendix are normal. No right adnexal mass.

Bilateral nonobstructing renal stones.

## 2017-01-04 DIAGNOSIS — N200 Calculus of kidney: Secondary | ICD-10-CM | POA: Diagnosis not present

## 2017-05-03 DIAGNOSIS — D2239 Melanocytic nevi of other parts of face: Secondary | ICD-10-CM | POA: Diagnosis not present

## 2017-05-03 DIAGNOSIS — L738 Other specified follicular disorders: Secondary | ICD-10-CM | POA: Diagnosis not present

## 2017-05-03 DIAGNOSIS — D2221 Melanocytic nevi of right ear and external auricular canal: Secondary | ICD-10-CM | POA: Diagnosis not present

## 2017-06-22 DIAGNOSIS — Z23 Encounter for immunization: Secondary | ICD-10-CM | POA: Diagnosis not present

## 2017-07-12 ENCOUNTER — Ambulatory Visit: Payer: 59 | Admitting: Sports Medicine

## 2017-07-19 DIAGNOSIS — N200 Calculus of kidney: Secondary | ICD-10-CM | POA: Diagnosis not present

## 2017-11-06 DIAGNOSIS — Z01419 Encounter for gynecological examination (general) (routine) without abnormal findings: Secondary | ICD-10-CM | POA: Diagnosis not present

## 2017-11-06 DIAGNOSIS — R232 Flushing: Secondary | ICD-10-CM | POA: Diagnosis not present

## 2017-11-25 ENCOUNTER — Encounter: Payer: Self-pay | Admitting: Sports Medicine

## 2017-11-25 ENCOUNTER — Ambulatory Visit: Payer: 59 | Admitting: Sports Medicine

## 2017-11-25 DIAGNOSIS — K219 Gastro-esophageal reflux disease without esophagitis: Secondary | ICD-10-CM | POA: Diagnosis not present

## 2017-11-25 DIAGNOSIS — R590 Localized enlarged lymph nodes: Secondary | ICD-10-CM

## 2017-11-25 MED ORDER — PANTOPRAZOLE SODIUM 40 MG PO TBEC: 40.0000 mg | DELAYED_RELEASE_TABLET | Freq: Every day | ORAL | 3 refills | Status: DC

## 2017-11-25 NOTE — Assessment & Plan Note (Signed)
Adding Protonix at bedtime, no food consumption within 2 hours of going to sleep.

## 2017-11-25 NOTE — Assessment & Plan Note (Signed)
This occurred immediately after popping a zit on her right cheek. We can simply watch this for now.

## 2017-11-25 NOTE — Patient Instructions (Signed)
Gastroesophageal Reflux Disease, Adult Normally, food travels down the esophagus and stays in the stomach to be digested. However, when a person has gastroesophageal reflux disease (GERD), food and stomach acid move back up into the esophagus. When this happens, the esophagus becomes sore and inflamed. Over time, GERD can create small holes (ulcers) in the lining of the esophagus. What are the causes? This condition is caused by a problem with the muscle between the esophagus and the stomach (lower esophageal sphincter, or LES). Normally, the LES muscle closes after food passes through the esophagus to the stomach. When the LES is weakened or abnormal, it does not close properly, and that allows food and stomach acid to go back up into the esophagus. The LES can be weakened by certain dietary substances, medicines, and medical conditions, including:  Tobacco use.  Pregnancy.  Having a hiatal hernia.  Heavy alcohol use.  Certain foods and beverages, such as coffee, chocolate, onions, and peppermint.  What increases the risk? This condition is more likely to develop in:  People who have an increased body weight.  People who have connective tissue disorders.  People who use NSAID medicines.  What are the signs or symptoms? Symptoms of this condition include:  Heartburn.  Difficult or painful swallowing.  The feeling of having a lump in the throat.  Abitter taste in the mouth.  Bad breath.  Having a large amount of saliva.  Having an upset or bloated stomach.  Belching.  Chest pain.  Shortness of breath or wheezing.  Ongoing (chronic) cough or a night-time cough.  Wearing away of tooth enamel.  Weight loss.  Different conditions can cause chest pain. Make sure to see your health care provider if you experience chest pain. How is this diagnosed? Your health care provider will take a medical history and perform a physical exam. To determine if you have mild or severe  GERD, your health care provider may also monitor how you respond to treatment. You may also have other tests, including:  An endoscopy toexamine your stomach and esophagus with a small camera.  A test thatmeasures the acidity level in your esophagus.  A test thatmeasures how much pressure is on your esophagus.  A barium swallow or modified barium swallow to show the shape, size, and functioning of your esophagus.  How is this treated? The goal of treatment is to help relieve your symptoms and to prevent complications. Treatment for this condition may vary depending on how severe your symptoms are. Your health care provider may recommend:  Changes to your diet.  Medicine.  Surgery.  Follow these instructions at home: Diet  Follow a diet as recommended by your health care provider. This may involve avoiding foods and drinks such as: ? Coffee and tea (with or without caffeine). ? Drinks that containalcohol. ? Energy drinks and sports drinks. ? Carbonated drinks or sodas. ? Chocolate and cocoa. ? Peppermint and mint flavorings. ? Garlic and onions. ? Horseradish. ? Spicy and acidic foods, including peppers, chili powder, curry powder, vinegar, hot sauces, and barbecue sauce. ? Citrus fruit juices and citrus fruits, such as oranges, lemons, and limes. ? Tomato-based foods, such as red sauce, chili, salsa, and pizza with red sauce. ? Fried and fatty foods, such as donuts, french fries, potato chips, and high-fat dressings. ? High-fat meats, such as hot dogs and fatty cuts of red and white meats, such as rib eye steak, sausage, ham, and bacon. ? High-fat dairy items, such as whole milk,   butter, and cream cheese.  Eat small, frequent meals instead of large meals.  Avoid drinking large amounts of liquid with your meals.  Avoid eating meals during the 2-3 hours before bedtime.  Avoid lying down right after you eat.  Do not exercise right after you eat. General  instructions  Pay attention to any changes in your symptoms.  Take over-the-counter and prescription medicines only as told by your health care provider. Do not take aspirin, ibuprofen, or other NSAIDs unless your health care provider told you to do so.  Do not use any tobacco products, including cigarettes, chewing tobacco, and e-cigarettes. If you need help quitting, ask your health care provider.  Wear loose-fitting clothing. Do not wear anything tight around your waist that causes pressure on your abdomen.  Raise (elevate) the head of your bed 6 inches (15cm).  Try to reduce your stress, such as with yoga or meditation. If you need help reducing stress, ask your health care provider.  If you are overweight, reduce your weight to an amount that is healthy for you. Ask your health care provider for guidance about a safe weight loss goal.  Keep all follow-up visits as told by your health care provider. This is important. Contact a health care provider if:  You have new symptoms.  You have unexplained weight loss.  You have difficulty swallowing, or it hurts to swallow.  You have wheezing or a persistent cough.  Your symptoms do not improve with treatment.  You have a hoarse voice. Get help right away if:  You have pain in your arms, neck, jaw, teeth, or back.  You feel sweaty, dizzy, or light-headed.  You have chest pain or shortness of breath.  You vomit and your vomit looks like blood or coffee grounds.  You faint.  Your stool is bloody or black.  You cannot swallow, drink, or eat. This information is not intended to replace advice given to you by your health care provider. Make sure you discuss any questions you have with your health care provider. Document Released: 07/18/2005 Document Revised: 03/07/2016 Document Reviewed: 02/02/2015 Elsevier Interactive Patient Education  2018 [employer]  

## 2017-11-25 NOTE — Progress Notes (Signed)
Subjective:    CC: Facial swelling  HPI: This is a pleasant 37 year old female, earlier she was trying to pop as it on her right cheek, she did this and then a day later she had a tender lump pop up in front of her right ear.  No pain in the knee throat, no fevers, chills, headaches, visual changes.  She also endorses excessive mucus production in her throat, hoarseness in the morning.  No melena, hematochezia, hematemesis, no weight loss.  I reviewed the past medical history, family history, social history, surgical history, and allergies today and no changes were needed.  Please see the problem list section below in epic for further details.  Past Medical History: Past Medical History:  Diagnosis Date  . Alopecia   . Anemia   . Anxiety   . Depression   . Family history of adverse reaction to anesthesia    PATERNAL GRANDFATHER HAD UNKNOWN PROBLEM  MANY YRS AGO - "IT WASN'T A SERIOUS PROBLEM"  . Gastritis   . GERD (gastroesophageal reflux disease)   . Gestational diabetes    on glyburide  . Gestational diabetes mellitus, class A2 07/07/2011   does not normally have diabetes  . Headache(784.0)    migraine  . Kidney stones   . OCD (obsessive compulsive disorder)   . Tremor    "familial tremor"   Past Surgical History: Past Surgical History:  Procedure Laterality Date  . CYSTOSCOPY W/ URETERAL STENT PLACEMENT Left 09/22/2015   Procedure: CYSTOSCOPY WITH LEFT  RETROGRADE PYELOGRAM/ STENT PLACEMENT;  Surgeon: Carolan Clines, MD;  Location: [facility];  Service: Urology;  Laterality: Left;  . MOUTH SURGERY     wisdom teeth and gum x2   Social History: Social History   Socioeconomic History  . Marital status: Married    Spouse name: None  . Number of children: None  . Years of education: None  . Highest education level: None  Social Needs  . Financial resource strain: None  . Food insecurity - worry: None  . Food insecurity - inability: None  . Transportation needs -  medical: None  . Transportation needs - non-medical: None  Occupational History  . None  Tobacco Use  . Smoking status: Never Smoker  . Smokeless tobacco: Never Used  Substance and Sexual Activity  . Alcohol use: Yes    Comment: occasional  . Drug use: No  . Sexual activity: Yes    Birth control/protection: Pill  Other Topics Concern  . None  Social History Narrative  . None   Family History: Family History  Problem Relation Age of Onset  . Migraines Mother   . Thyroid disease Mother   . Hypertension Mother   . Miscarriages / [place] Mother   . Urolithiasis Father   . Hypertension Father   . Diabetes Father   . Nephrolithiasis Father   . Cancer Maternal Grandmother        lung ca  . COPD Maternal Grandmother   . Heart disease Maternal Grandmother   . Diabetes Maternal Grandmother   . Depression Maternal Grandmother   . Alcohol abuse Maternal Grandfather   . Heart disease Maternal Grandfather   . Stroke Maternal Grandfather   . Stroke Paternal Grandmother   . Diabetes Paternal Grandmother   . Heart disease Paternal Grandfather   . Diabetes Paternal Grandfather    Allergies: Allergies  Allergen Reactions  . Citalopram Hydrobromide Itching    Pt states that this medication causes prolonged itching.....lasting for about a  month.  . Cymbalta [Duloxetine Hcl]    Medications: See med rec.  Review of Systems: No fevers, chills, night sweats, weight loss, chest pain, or shortness of breath.   Objective:    General: Well Developed, well nourished, and in no acute distress.  Neuro: Alert and oriented x3, extra-ocular muscles intact, sensation grossly intact.  HEENT: Normocephalic, atraumatic, pupils equal round reactive to light, neck supple, no masses, thyroid nonpalpable.  There is a irritated pustule on the right cheek, there is also a slightly tender right preauricular lymph node. Skin: Warm and dry, no rashes. Cardiac: Regular rate and rhythm, no murmurs rubs  or gallops, no lower extremity edema.  Respiratory: Clear to auscultation bilaterally. Not using accessory muscles, speaking in full sentences.  Impression and Recommendations:    Lymphadenopathy, preauricular This occurred immediately after popping a zit on her right cheek. We can simply watch this for now.  LPRD (laryngopharyngeal reflux disease) Adding Protonix at bedtime, no food consumption within 2 hours of going to sleep.  I spent 25 minutes with this patient, greater than 50% was face-to-face time counseling regarding the above diagnoses ___________________________________________ Gwen Her. Dianah Field, M.D., ABFM., CAQSM. Primary Care and [place] Instructor of [place] of [facility] of Medicine

## 2018-01-01 ENCOUNTER — Encounter: Payer: Self-pay | Admitting: Sports Medicine

## 2018-01-01 ENCOUNTER — Ambulatory Visit: Payer: 59 | Admitting: Sports Medicine

## 2018-01-01 DIAGNOSIS — R590 Localized enlarged lymph nodes: Secondary | ICD-10-CM | POA: Diagnosis not present

## 2018-01-01 MED ORDER — AMOXICILLIN-POT CLAVULANATE 875-125 MG PO TABS: 1.0000 | ORAL_TABLET | Freq: Two times a day (BID) | ORAL | 0 refills | Status: AC

## 2018-01-01 MED ORDER — FLUCONAZOLE 150 MG PO TABS: 150.0000 mg | ORAL_TABLET | Freq: Once | ORAL | 0 refills | Status: AC

## 2018-01-01 MED ORDER — PREDNISONE 50 MG PO TABS: 50.0000 mg | ORAL_TABLET | Freq: Every day | ORAL | 0 refills | Status: DC

## 2018-01-01 NOTE — Progress Notes (Signed)
Subjective:    CC: Feeling sick  HPI: Preauricular lymphadenopathy: Resolved.  Unfortunately had influenza about 2 weeks ago, and now has persistent pain and pressure over the frontal and maxillary sinuses with radiation to the ears, overall sensation of stuffiness of both ears, and significant nasal discharge.  Moderate, persistent.  No cough, shortness of breath, constitutional symptoms.  I reviewed the past medical history, family history, social history, surgical history, and allergies today and no changes were needed.  Please see the problem list section below in epic for further details.  Past Medical History: Past Medical History:  Diagnosis Date  . Alopecia   . Anemia   . Anxiety   . Depression   . Family history of adverse reaction to anesthesia    PATERNAL GRANDFATHER HAD UNKNOWN PROBLEM  MANY YRS AGO - "IT WASN'T A SERIOUS PROBLEM"  . Gastritis   . GERD (gastroesophageal reflux disease)   . Gestational diabetes    on glyburide  . Gestational diabetes mellitus, class A2 07/07/2011   does not normally have diabetes  . Headache(784.0)    migraine  . Kidney stones   . OCD (obsessive compulsive disorder)   . Tremor    "familial tremor"   Past Surgical History: Past Surgical History:  Procedure Laterality Date  . CYSTOSCOPY W/ URETERAL STENT PLACEMENT Left 09/22/2015   Procedure: CYSTOSCOPY WITH LEFT  RETROGRADE PYELOGRAM/ STENT PLACEMENT;  Surgeon: Carolan Clines, MD;  Location: [facility];  Service: Urology;  Laterality: Left;  . MOUTH SURGERY     wisdom teeth and gum x2   Social History: Social History   Socioeconomic History  . Marital status: Married    Spouse name: None  . Number of children: None  . Years of education: None  . Highest education level: None  Social Needs  . Financial resource strain: None  . Food insecurity - worry: None  . Food insecurity - inability: None  . Transportation needs - medical: None  . Transportation needs - non-medical:  None  Occupational History  . None  Tobacco Use  . Smoking status: Never Smoker  . Smokeless tobacco: Never Used  Substance and Sexual Activity  . Alcohol use: Yes    Comment: occasional  . Drug use: No  . Sexual activity: Yes    Birth control/protection: Pill  Other Topics Concern  . None  Social History Narrative  . None   Family History: Family History  Problem Relation Age of Onset  . Migraines Mother   . Thyroid disease Mother   . Hypertension Mother   . Miscarriages / [place] Mother   . Urolithiasis Father   . Hypertension Father   . Diabetes Father   . Nephrolithiasis Father   . Cancer Maternal Grandmother        lung ca  . COPD Maternal Grandmother   . Heart disease Maternal Grandmother   . Diabetes Maternal Grandmother   . Depression Maternal Grandmother   . Alcohol abuse Maternal Grandfather   . Heart disease Maternal Grandfather   . Stroke Maternal Grandfather   . Stroke Paternal Grandmother   . Diabetes Paternal Grandmother   . Heart disease Paternal Grandfather   . Diabetes Paternal Grandfather    Allergies: Allergies  Allergen Reactions  . Citalopram Hydrobromide Itching    Pt states that this medication causes prolonged itching.....lasting for about a month.  Marland Kitchen Cymbalta [Duloxetine Hcl]    Medications: See med rec.  Review of Systems: No fevers, chills, night sweats, weight  loss, chest pain, or shortness of breath.   Objective:    General: Well Developed, well nourished, and in no acute distress.  Neuro: Alert and oriented x3, extra-ocular muscles intact, sensation grossly intact.  HEENT: Normocephalic, atraumatic, pupils equal round reactive to light, neck supple, no masses, no lymphadenopathy, thyroid nonpalpable.  Oropharynx, nasopharynx, ear canals unremarkable with the exception of mild tenderness over the maxillary sinuses. Skin: Warm and dry, no rashes. Cardiac: Regular rate and rhythm, no murmurs rubs or gallops, no lower extremity  edema.  Respiratory: Clear to auscultation bilaterally. Not using accessory muscles, speaking in full sentences.  Impression and Recommendations:    Lymphadenopathy, preauricular Clinically resolved. She has developed what sounds to be a maxillary sinusitis after influenza 2 weeks ago. Adding a course of Augmentin, a burst of prednisone, Flonase is not effective. She also has some eustachian tube dysfunction. Diflucan in case of yeast infection. ___________________________________________ Gwen Her. Dianah Field, M.D., ABFM., CAQSM. Primary Care and [place] Instructor of [place] of [facility] of Medicine

## 2018-01-01 NOTE — Assessment & Plan Note (Signed)
Clinically resolved. She has developed what sounds to be a maxillary sinusitis after influenza 2 weeks ago. Adding a course of Augmentin, a burst of prednisone, Flonase is not effective. She also has some eustachian tube dysfunction. Diflucan in case of yeast infection.

## 2018-01-17 DIAGNOSIS — N3000 Acute cystitis without hematuria: Secondary | ICD-10-CM | POA: Diagnosis not present

## 2018-01-17 DIAGNOSIS — N200 Calculus of kidney: Secondary | ICD-10-CM | POA: Diagnosis not present

## 2018-01-17 DIAGNOSIS — K219 Gastro-esophageal reflux disease without esophagitis: Secondary | ICD-10-CM | POA: Diagnosis not present

## 2018-02-24 DIAGNOSIS — D225 Melanocytic nevi of trunk: Secondary | ICD-10-CM | POA: Diagnosis not present

## 2018-02-24 DIAGNOSIS — L700 Acne vulgaris: Secondary | ICD-10-CM | POA: Diagnosis not present

## 2018-06-17 DIAGNOSIS — D225 Melanocytic nevi of trunk: Secondary | ICD-10-CM | POA: Diagnosis not present

## 2018-06-17 DIAGNOSIS — D2221 Melanocytic nevi of right ear and external auricular canal: Secondary | ICD-10-CM | POA: Diagnosis not present

## 2018-06-17 DIAGNOSIS — D2261 Melanocytic nevi of right upper limb, including shoulder: Secondary | ICD-10-CM | POA: Diagnosis not present

## 2018-07-02 DIAGNOSIS — R3129 Other microscopic hematuria: Secondary | ICD-10-CM | POA: Diagnosis not present

## 2018-07-02 DIAGNOSIS — N200 Calculus of kidney: Secondary | ICD-10-CM | POA: Diagnosis not present

## 2018-07-14 ENCOUNTER — Ambulatory Visit (INDEPENDENT_AMBULATORY_CARE_PROVIDER_SITE_OTHER): Payer: 59 | Admitting: Sports Medicine

## 2018-07-14 ENCOUNTER — Encounter: Payer: Self-pay | Admitting: Sports Medicine

## 2018-07-14 DIAGNOSIS — N200 Calculus of kidney: Secondary | ICD-10-CM | POA: Diagnosis not present

## 2018-07-14 DIAGNOSIS — G250 Essential tremor: Secondary | ICD-10-CM

## 2018-07-14 MED ORDER — PROPRANOLOL HCL 10 MG PO TABS: 10.0000 mg | ORAL_TABLET | Freq: Every day | ORAL | 3 refills | Status: DC | PRN

## 2018-07-14 MED ORDER — SCOPOLAMINE 1 MG/3DAYS TD PT72: 1.0000 | MEDICATED_PATCH | TRANSDERMAL | 3 refills | Status: DC

## 2018-07-14 MED ORDER — DOXYCYCLINE HYCLATE 100 MG PO TABS: 100.0000 mg | ORAL_TABLET | Freq: Two times a day (BID) | ORAL | 0 refills | Status: AC

## 2018-07-14 NOTE — Assessment & Plan Note (Signed)
Had an episode of left-sided flank pain, CT done with the urologist showed historic existing stones without anything new. She did have significant hematuria with a negative culture. Doxycycline improved her symptoms. I am going to give her some doxycycline to keep on hand if she develops symptoms while in [facility], also adding some scopolamine patches.

## 2018-07-14 NOTE — Progress Notes (Signed)
Subjective:    CC: Left flank pain  HPI: This is a pleasant 37 year old female with history of bilateral nephrolithiasis.  She started to have flank pain, no hematuria visible.  Was seen by her urologist, CT scan did show the existing nephroliths sitting in the kidney without any movement.  She had hematuria microscopic on urinalysis but a negative culture.  She was treated with doxycycline and tells me her pain is starting to resolve.  Is not positional, not worse with sitting, flexion, Valsalva, nothing radicular.  No bowel or bladder dysfunction, saddle numbness, constitutional symptoms.  Does have a trip coming up to [place] and is wondering if she can have some doxycycline to keep on hand just in case he develops a recurrence of symptoms.  She is here mostly for me to determine whether or not there is a musculoskeletal component.  In addition she has benign essential tremor, well controlled with occasional propranolol.  Her neurologist who had been prescribing it has since retired and she is wondering if we can take over.  I reviewed the past medical history, family history, social history, surgical history, and allergies today and no changes were needed.  Please see the problem list section below in epic for further details.  Past Medical History: Past Medical History:  Diagnosis Date  . Alopecia   . Anemia   . Anxiety   . Depression   . Family history of adverse reaction to anesthesia    PATERNAL GRANDFATHER HAD UNKNOWN PROBLEM  MANY YRS AGO - "IT WASN'T A SERIOUS PROBLEM"  . Gastritis   . GERD (gastroesophageal reflux disease)   . Gestational diabetes    on glyburide  . Gestational diabetes mellitus, class A2 07/07/2011   does not normally have diabetes  . Headache(784.0)    migraine  . Kidney stones   . OCD (obsessive compulsive disorder)   . Tremor    "familial tremor"   Past Surgical History: Past Surgical History:  Procedure Laterality Date  . CYSTOSCOPY W/  URETERAL STENT PLACEMENT Left 09/22/2015   Procedure: CYSTOSCOPY WITH LEFT  RETROGRADE PYELOGRAM/ STENT PLACEMENT;  Surgeon: Carolan Clines, MD;  Location: [facility];  Service: Urology;  Laterality: Left;  . MOUTH SURGERY     wisdom teeth and gum x2   Social History: Social History   Socioeconomic History  . Marital status: Married    Spouse name: Not on file  . Number of children: Not on file  . Years of education: Not on file  . Highest education level: Not on file  Occupational History  . Not on file  Social Needs  . Financial resource strain: Not on file  . Food insecurity:    Worry: Not on file    Inability: Not on file  . Transportation needs:    Medical: Not on file    Non-medical: Not on file  Tobacco Use  . Smoking status: Never Smoker  . Smokeless tobacco: Never Used  Substance and Sexual Activity  . Alcohol use: Yes    Comment: occasional  . Drug use: No  . Sexual activity: Yes    Birth control/protection: Pill  Lifestyle  . Physical activity:    Days per week: Not on file    Minutes per session: Not on file  . Stress: Not on file  Relationships  . Social connections:    Talks on phone: Not on file    Gets together: Not on file    Attends religious service: Not on  file    Active member of club or organization: Not on file    Attends meetings of clubs or organizations: Not on file    Relationship status: Not on file  Other Topics Concern  . Not on file  Social History Narrative  . Not on file   Family History: Family History  Problem Relation Age of Onset  . Migraines Mother   . Thyroid disease Mother   . Hypertension Mother   . Miscarriages / [place] Mother   . Urolithiasis Father   . Hypertension Father   . Diabetes Father   . Nephrolithiasis Father   . Cancer Maternal Grandmother        lung ca  . COPD Maternal Grandmother   . Heart disease Maternal Grandmother   . Diabetes Maternal Grandmother   . Depression Maternal Grandmother     . Alcohol abuse Maternal Grandfather   . Heart disease Maternal Grandfather   . Stroke Maternal Grandfather   . Stroke Paternal Grandmother   . Diabetes Paternal Grandmother   . Heart disease Paternal Grandfather   . Diabetes Paternal Grandfather    Allergies: Allergies  Allergen Reactions  . Citalopram Hydrobromide Itching    Pt states that this medication causes prolonged itching.....lasting for about a month.  Marland Kitchen Cymbalta [Duloxetine Hcl]    Medications: See med rec.  Review of Systems: No fevers, chills, night sweats, weight loss, chest pain, or shortness of breath.   Objective:    General: Well Developed, well nourished, and in no acute distress.  Neuro: Alert and oriented x3, extra-ocular muscles intact, sensation grossly intact.  HEENT: Normocephalic, atraumatic, pupils equal round reactive to light, neck supple, no masses, no lymphadenopathy, thyroid nonpalpable.  Skin: Warm and dry, no rashes. Cardiac: Regular rate and rhythm, no murmurs rubs or gallops, no lower extremity edema.  Respiratory: Clear to auscultation bilaterally. Not using accessory muscles, speaking in full sentences. Back Exam:  Inspection: Unremarkable  Motion: Flexion 45 deg, Extension 45 deg, Side Bending to 45 deg bilaterally,  Rotation to 45 deg bilaterally  SLR laying: Negative  XSLR laying: Negative  Palpable tenderness: None. FABER: negative. Sensory change: Gross sensation intact to all lumbar and sacral dermatomes.  Reflexes: 2+ at both patellar tendons, 2+ at achilles tendons, Babinski's downgoing.  Strength at foot  Plantar-flexion: 5/5 Dorsi-flexion: 5/5 Eversion: 5/5 Inversion: 5/5  Leg strength  Quad: 5/5 Hamstring: 5/5 Hip flexor: 5/5 Hip abductors: 5/5  Gait unremarkable.  Impression and Recommendations:    Left nephrolithiasis Had an episode of left-sided flank pain, CT done with the urologist showed historic existing stones without anything new. She did have significant  hematuria with a negative culture. Doxycycline improved her symptoms. I am going to give her some doxycycline to keep on hand if she develops symptoms while in [facility], also adding some scopolamine patches.  Essential tremor Well-controlled with propranolol, refilling this. ___________________________________________ Gwen Her. Dianah Field, M.D., ABFM., CAQSM. Primary Care and [place] Instructor of [place] of [facility] of Medicine

## 2018-07-14 NOTE — Assessment & Plan Note (Signed)
Well-controlled with propranolol, refilling this.

## 2018-07-17 DIAGNOSIS — R1032 Left lower quadrant pain: Secondary | ICD-10-CM | POA: Diagnosis not present

## 2018-09-13 ENCOUNTER — Encounter: Payer: Self-pay | Admitting: Sports Medicine

## 2018-09-13 DIAGNOSIS — R590 Localized enlarged lymph nodes: Secondary | ICD-10-CM

## 2018-09-15 MED ORDER — FLUCONAZOLE 150 MG PO TABS: 150.0000 mg | ORAL_TABLET | Freq: Once | ORAL | 0 refills | Status: AC

## 2018-09-15 MED ORDER — AZITHROMYCIN 250 MG PO TABS: ORAL_TABLET | ORAL | 0 refills | Status: DC

## 2018-09-15 NOTE — Assessment & Plan Note (Signed)
Initially resolved with Augmentin and prednisone approximately 8 months ago. Now with recurrence of 1 month duration, adding azithromycin, if persistence we will image and biopsy. Adding Diflucan for use if needed.

## 2018-09-23 ENCOUNTER — Ambulatory Visit (INDEPENDENT_AMBULATORY_CARE_PROVIDER_SITE_OTHER): Payer: 59

## 2018-09-23 ENCOUNTER — Encounter: Payer: Self-pay | Admitting: Sports Medicine

## 2018-09-23 ENCOUNTER — Ambulatory Visit (INDEPENDENT_AMBULATORY_CARE_PROVIDER_SITE_OTHER): Payer: 59 | Admitting: Sports Medicine

## 2018-09-23 DIAGNOSIS — R590 Localized enlarged lymph nodes: Secondary | ICD-10-CM | POA: Diagnosis not present

## 2018-09-23 NOTE — Progress Notes (Signed)
Subjective:    CC: Facial mass  HPI: Charlotte Nelson is a pleasant 37 year old female, I treated her for a right preauricular lymphadenitis about 8 to 9 months ago, she responded well to a course of Augmentin, prednisone.  She unfortunately had a recurrence recently, we did a course of azithromycin, for the most part it is resolved but she still has some residual fullness just anterior to her right ear.  No fevers, chills, night sweats, weight loss.  It was initially painful, she squeezed it and it did drain to some degree.  I reviewed the past medical history, family history, social history, surgical history, and allergies today and no changes were needed.  Please see the problem list section below in epic for further details.  Past Medical History: Past Medical History:  Diagnosis Date  . Alopecia   . Anemia   . Anxiety   . Depression   . Family history of adverse reaction to anesthesia    PATERNAL GRANDFATHER HAD UNKNOWN PROBLEM  MANY YRS AGO - "IT WASN'T A SERIOUS PROBLEM"  . Gastritis   . GERD (gastroesophageal reflux disease)   . Gestational diabetes    on glyburide  . Gestational diabetes mellitus, class A2 07/07/2011   does not normally have diabetes  . Headache(784.0)    migraine  . Kidney stones   . OCD (obsessive compulsive disorder)   . Tremor    "familial tremor"   Past Surgical History: Past Surgical History:  Procedure Laterality Date  . CYSTOSCOPY W/ URETERAL STENT PLACEMENT Left 09/22/2015   Procedure: CYSTOSCOPY WITH LEFT  RETROGRADE PYELOGRAM/ STENT PLACEMENT;  Surgeon: Carolan Clines, MD;  Location: [facility];  Service: Urology;  Laterality: Left;  . MOUTH SURGERY     wisdom teeth and gum x2   Social History: Social History   Socioeconomic History  . Marital status: Married    Spouse name: Not on file  . Number of children: Not on file  . Years of education: Not on file  . Highest education level: Not on file  Occupational History  . Not on file  Social  Needs  . Financial resource strain: Not on file  . Food insecurity:    Worry: Not on file    Inability: Not on file  . Transportation needs:    Medical: Not on file    Non-medical: Not on file  Tobacco Use  . Smoking status: Never Smoker  . Smokeless tobacco: Never Used  Substance and Sexual Activity  . Alcohol use: Yes    Comment: occasional  . Drug use: No  . Sexual activity: Yes    Birth control/protection: Pill  Lifestyle  . Physical activity:    Days per week: Not on file    Minutes per session: Not on file  . Stress: Not on file  Relationships  . Social connections:    Talks on phone: Not on file    Gets together: Not on file    Attends religious service: Not on file    Active member of club or organization: Not on file    Attends meetings of clubs or organizations: Not on file    Relationship status: Not on file  Other Topics Concern  . Not on file  Social History Narrative  . Not on file   Family History: Family History  Problem Relation Age of Onset  . Migraines Mother   . Thyroid disease Mother   . Hypertension Mother   . Miscarriages / [place] Mother   .  Urolithiasis Father   . Hypertension Father   . Diabetes Father   . Nephrolithiasis Father   . Cancer Maternal Grandmother        lung ca  . COPD Maternal Grandmother   . Heart disease Maternal Grandmother   . Diabetes Maternal Grandmother   . Depression Maternal Grandmother   . Alcohol abuse Maternal Grandfather   . Heart disease Maternal Grandfather   . Stroke Maternal Grandfather   . Stroke Paternal Grandmother   . Diabetes Paternal Grandmother   . Heart disease Paternal Grandfather   . Diabetes Paternal Grandfather    Allergies: Allergies  Allergen Reactions  . Citalopram Hydrobromide Itching    Pt states that this medication causes prolonged itching.....lasting for about a month.  Marland Kitchen Cymbalta [Duloxetine Hcl]    Medications: See med rec.  Review of Systems: No fevers, chills,  night sweats, weight loss, chest pain, or shortness of breath.   Objective:    General: Well Developed, well nourished, and in no acute distress.  Neuro: Alert and oriented x3, extra-ocular muscles intact, sensation grossly intact.  HEENT: Normocephalic, atraumatic, pupils equal round reactive to light, neck supple, no masses, no lymphadenopathy, thyroid nonpalpable.  Oropharynx, nasopharynx, ear canals unremarkable, there is slight fullness and discoloration in the right preauricular region, slight firmness in the subcutaneous tissues as well.Skin: Warm and dry, no rashes. Cardiac: Regular rate and rhythm, no murmurs rubs or gallops, no lower extremity edema.  Respiratory: Clear to auscultation bilaterally. Not using accessory muscles, speaking in full sentences.  Impression and Recommendations:    Lymphadenopathy, preauricular Initial resolution with Augmentin and prednisone approximately 8 months ago, she had a recurrence of 1 month duration, we did a course of azithromycin that provided some relief. She does have persistence of a tiny residual fullness in the right preauricular region, question preauricular lymphadenitis versus parotitis or parotid abscess. At this point we are going to proceed with an ultrasound, followed by CT with contrast if unrevealing. ___________________________________________ Gwen Her. Dianah Field, M.D., ABFM., CAQSM. Primary Care and Sports Medicine [facility]  Adjunct Professor of [place] of [facility] of Medicine

## 2018-09-23 NOTE — Assessment & Plan Note (Signed)
Initial resolution with Augmentin and prednisone approximately 8 months ago, she had a recurrence of 1 month duration, we did a course of azithromycin that provided some relief. She does have persistence of a tiny residual fullness in the right preauricular region, question preauricular lymphadenitis versus parotitis or parotid abscess. At this point we are going to proceed with an ultrasound, followed by CT with contrast if unrevealing.

## 2018-10-22 ENCOUNTER — Encounter: Payer: Self-pay | Admitting: Sports Medicine

## 2018-11-13 DIAGNOSIS — Z01419 Encounter for gynecological examination (general) (routine) without abnormal findings: Secondary | ICD-10-CM | POA: Diagnosis not present

## 2019-01-05 ENCOUNTER — Telehealth: Payer: Self-pay | Admitting: Sports Medicine

## 2019-01-05 ENCOUNTER — Other Ambulatory Visit: Payer: Self-pay

## 2019-01-05 ENCOUNTER — Encounter: Payer: Self-pay | Admitting: Sports Medicine

## 2019-01-05 ENCOUNTER — Ambulatory Visit: Payer: 59 | Admitting: Sports Medicine

## 2019-01-05 DIAGNOSIS — F39 Unspecified mood [affective] disorder: Secondary | ICD-10-CM | POA: Diagnosis not present

## 2019-01-05 MED ORDER — ALPRAZOLAM 0.5 MG PO TABS: 0.5000 mg | ORAL_TABLET | Freq: Every day | ORAL | 0 refills | Status: DC | PRN

## 2019-01-05 MED ORDER — VILAZODONE HCL 10 MG PO TABS: 10.0000 mg | ORAL_TABLET | Freq: Every day | ORAL | 0 refills | Status: DC

## 2019-01-05 MED ORDER — ONDANSETRON 8 MG PO TBDP: 8.0000 mg | ORAL_TABLET | Freq: Three times a day (TID) | ORAL | 3 refills | Status: DC | PRN

## 2019-01-05 NOTE — Telephone Encounter (Signed)
Called Pt, offered appt for today. She is going to talk to her boss then call us back. States she took a xanax over the weekend and it helped. Doesn't feel this is cardiac related. Still needs eval.

## 2019-01-05 NOTE — Telephone Encounter (Addendum)
Chest pain - possibly anxiety related - patient requesting appointment via mychart This patient may need to be triaged.

## 2019-01-05 NOTE — Progress Notes (Signed)
Subjective:    CC: Uncontrolled anxiety  HPI: This is a pleasant 38 year old female, we have been treating her for anxiety and depression for a long time now.  She did not respond to multiple anxiolytics, she is now having worsening of anxiety told to get occasional chest pain but she does have a fantastic exercise tolerance without any reproduction of chest pain.  Symptoms are moderate, persistent, she does have a behavioral therapist, at this point she understands that she probably needs pharmacotherapy for intervention.  Suicidal homicidal ideation.  I reviewed the past medical history, family history, social history, surgical history, and allergies today and no changes were needed.  Please see the problem list section below in epic for further details.  Past Medical History: Past Medical History:  Diagnosis Date  . Alopecia   . Anemia   . Anxiety   . Depression   . Family history of adverse reaction to anesthesia    PATERNAL GRANDFATHER HAD UNKNOWN PROBLEM  MANY YRS AGO - "IT WASN'T A SERIOUS PROBLEM"  . Gastritis   . GERD (gastroesophageal reflux disease)   . Gestational diabetes    on glyburide  . Gestational diabetes mellitus, class A2 07/07/2011   does not normally have diabetes  . Headache(784.0)    migraine  . Kidney stones   . OCD (obsessive compulsive disorder)   . Tremor    "familial tremor"   Past Surgical History: Past Surgical History:  Procedure Laterality Date  . CYSTOSCOPY W/ URETERAL STENT PLACEMENT Left 09/22/2015   Procedure: CYSTOSCOPY WITH LEFT  RETROGRADE PYELOGRAM/ STENT PLACEMENT;  Surgeon: Carolan Clines, MD;  Location: [facility];  Service: Urology;  Laterality: Left;  . MOUTH SURGERY     wisdom teeth and gum x2   Social History: Social History   Socioeconomic History  . Marital status: Married    Spouse name: Not on file  . Number of children: Not on file  . Years of education: Not on file  . Highest education level: Not on file   Occupational History  . Not on file  Social Needs  . Financial resource strain: Not on file  . Food insecurity:    Worry: Not on file    Inability: Not on file  . Transportation needs:    Medical: Not on file    Non-medical: Not on file  Tobacco Use  . Smoking status: Never Smoker  . Smokeless tobacco: Never Used  Substance and Sexual Activity  . Alcohol use: Yes    Comment: occasional  . Drug use: No  . Sexual activity: Yes    Birth control/protection: Pill  Lifestyle  . Physical activity:    Days per week: Not on file    Minutes per session: Not on file  . Stress: Not on file  Relationships  . Social connections:    Talks on phone: Not on file    Gets together: Not on file    Attends religious service: Not on file    Active member of club or organization: Not on file    Attends meetings of clubs or organizations: Not on file    Relationship status: Not on file  Other Topics Concern  . Not on file  Social History Narrative  . Not on file   Family History: Family History  Problem Relation Age of Onset  . Migraines Mother   . Thyroid disease Mother   . Hypertension Mother   . Miscarriages / [place] Mother   . Urolithiasis Father   .  Hypertension Father   . Diabetes Father   . Nephrolithiasis Father   . Cancer Maternal Grandmother        lung ca  . COPD Maternal Grandmother   . Heart disease Maternal Grandmother   . Diabetes Maternal Grandmother   . Depression Maternal Grandmother   . Alcohol abuse Maternal Grandfather   . Heart disease Maternal Grandfather   . Stroke Maternal Grandfather   . Stroke Paternal Grandmother   . Diabetes Paternal Grandmother   . Heart disease Paternal Grandfather   . Diabetes Paternal Grandfather    Allergies: Allergies  Allergen Reactions  . Citalopram Hydrobromide Itching    Pt states that this medication causes prolonged itching.....lasting for about a month.  Marland Kitchen Cymbalta [Duloxetine Hcl]    Medications: See med  rec.  Review of Systems: No fevers, chills, night sweats, weight loss, chest pain, or shortness of breath.   Objective:    General: Well Developed, well nourished, and in no acute distress.  Neuro: Alert and oriented x3, extra-ocular muscles intact, sensation grossly intact.  HEENT: Normocephalic, atraumatic, pupils equal round reactive to light, neck supple, no masses, no lymphadenopathy, thyroid nonpalpable.  Skin: Warm and dry, no rashes. Cardiac: Regular rate and rhythm, no murmurs rubs or gallops, no lower extremity edema.  Respiratory: Clear to auscultation bilaterally. Not using accessory muscles, speaking in full sentences.  Impression and Recommendations:    Mood disorder ([place]) Did not respond to citalopram, Cymbalta, Trintellix. Starting Viibryd, will do extremely slow taper, starting at 10 mg. Discount coupon given, number 90 pills given.   Adding alprazolam 0.5 mg, number 10/month. She is working with a Scientist, water quality as well.  I spent 25 minutes with this patient, greater than 50% was face-to-face time counseling regarding the above diagnoses  ___________________________________________ Gwen Her. Dianah Field, M.D., ABFM., CAQSM. Primary Care and Sports Medicine [facility]  Adjunct Professor of [place] of [facility] [employer]

## 2019-01-05 NOTE — Telephone Encounter (Signed)
Thank you :)

## 2019-01-05 NOTE — Telephone Encounter (Signed)
Pt returned clinic call and was added to PCP's schedule for eval today.

## 2019-01-05 NOTE — Assessment & Plan Note (Signed)
Did not respond to citalopram, Cymbalta, Trintellix. Starting Viibryd, will do extremely slow taper, starting at 10 mg. Discount coupon given, number 90 pills given.   Adding alprazolam 0.5 mg, number 10/month. She is working with a Scientist, water quality as well.

## 2019-01-08 ENCOUNTER — Ambulatory Visit: Payer: 59 | Admitting: Sports Medicine

## 2019-01-21 ENCOUNTER — Encounter (INDEPENDENT_AMBULATORY_CARE_PROVIDER_SITE_OTHER): Payer: 59 | Admitting: Sports Medicine

## 2019-01-21 DIAGNOSIS — H1013 Acute atopic conjunctivitis, bilateral: Secondary | ICD-10-CM

## 2019-01-22 DIAGNOSIS — H1010 Acute atopic conjunctivitis, unspecified eye: Secondary | ICD-10-CM | POA: Insufficient documentation

## 2019-01-22 MED ORDER — AZELASTINE HCL 0.05 % OP SOLN: 2.0000 [drp] | Freq: Two times a day (BID) | OPHTHALMIC | 11 refills | Status: DC

## 2019-01-22 NOTE — Assessment & Plan Note (Signed)
Continue Xiidra. Adding Optivar.

## 2019-02-03 ENCOUNTER — Ambulatory Visit (INDEPENDENT_AMBULATORY_CARE_PROVIDER_SITE_OTHER): Payer: 59 | Admitting: Sports Medicine

## 2019-02-03 ENCOUNTER — Other Ambulatory Visit: Payer: Self-pay

## 2019-02-03 DIAGNOSIS — K219 Gastro-esophageal reflux disease without esophagitis: Secondary | ICD-10-CM | POA: Diagnosis not present

## 2019-02-03 DIAGNOSIS — M51369 Other intervertebral disc degeneration, lumbar region without mention of lumbar back pain or lower extremity pain: Secondary | ICD-10-CM | POA: Insufficient documentation

## 2019-02-03 DIAGNOSIS — F39 Unspecified mood [affective] disorder: Secondary | ICD-10-CM | POA: Diagnosis not present

## 2019-02-03 DIAGNOSIS — M5136 Other intervertebral disc degeneration, lumbar region: Secondary | ICD-10-CM | POA: Insufficient documentation

## 2019-02-03 MED ORDER — VILAZODONE HCL 20 MG PO TABS: 1.0000 | ORAL_TABLET | Freq: Every day | ORAL | 0 refills | Status: DC

## 2019-02-03 MED ORDER — PANTOPRAZOLE SODIUM 40 MG PO TBEC: 40.0000 mg | DELAYED_RELEASE_TABLET | Freq: Every day | ORAL | 3 refills | Status: DC

## 2019-02-03 MED ORDER — ALPRAZOLAM 0.5 MG PO TABS: 0.5000 mg | ORAL_TABLET | Freq: Every day | ORAL | 0 refills | Status: DC | PRN

## 2019-02-03 NOTE — Assessment & Plan Note (Signed)
Much better with protonix.

## 2019-02-03 NOTE — Progress Notes (Signed)
Virtual Visit via Telephone   I connected with  Charlotte Nelson  on 02/03/19 by telephone and verified that I am speaking with the correct person using two identifiers.   I discussed the limitations, risks, security and privacy concerns of performing an evaluation and management service by telephone, including the higher likelihood of inaccurate diagnosis and treatment, and the availability of in person appointments.  We also discussed the likely need of an additional face to face encounter for complete and high quality delivery of care.  I also discussed with the patient that there may be a patient responsible charge related to this service. The patient expressed understanding and wishes to proceed.  Subjective:    CC: Follow-up  HPI: Anxiety and depression: Slight improvement subjectively with Viibryd.  Needs a refill on alprazolam.  We have simply had to do a much slower up taper than usual.  Laryngeopharyngeal reflux: Improved considerably with pantoprazole.  Needs a refill.  Leg weakness: With numbness and tingling, mild no bowel or bladder dysfunction, saddle numbness, constitutional symptoms, no trauma.  I reviewed the past medical history, family history, social history, surgical history, and allergies today and no changes were needed.  Please see the problem list section below in epic for further details.  Past Medical History: Past Medical History:  Diagnosis Date  . Alopecia   . Anemia   . Anxiety   . Depression   . Family history of adverse reaction to anesthesia    PATERNAL GRANDFATHER HAD UNKNOWN PROBLEM  MANY YRS AGO - "IT WASN'T A SERIOUS PROBLEM"  . Gastritis   . GERD (gastroesophageal reflux disease)   . Gestational diabetes    on glyburide  . Gestational diabetes mellitus, class A2 07/07/2011   does not normally have diabetes  . Headache(784.0)    migraine  . Kidney stones   . OCD (obsessive compulsive disorder)   . Tremor    "familial tremor"   Past  Surgical History: Past Surgical History:  Procedure Laterality Date  . CYSTOSCOPY W/ URETERAL STENT PLACEMENT Left 09/22/2015   Procedure: CYSTOSCOPY WITH LEFT  RETROGRADE PYELOGRAM/ STENT PLACEMENT;  Surgeon: Carolan Clines, MD;  Location: [facility];  Service: Urology;  Laterality: Left;  . MOUTH SURGERY     wisdom teeth and gum x2   Social History: Social History   Socioeconomic History  . Marital status: Married    Spouse name: Not on file  . Number of children: Not on file  . Years of education: Not on file  . Highest education level: Not on file  Occupational History  . Not on file  Social Needs  . Financial resource strain: Not on file  . Food insecurity:    Worry: Not on file    Inability: Not on file  . Transportation needs:    Medical: Not on file    Non-medical: Not on file  Tobacco Use  . Smoking status: Never Smoker  . Smokeless tobacco: Never Used  Substance and Sexual Activity  . Alcohol use: Yes    Comment: occasional  . Drug use: No  . Sexual activity: Yes    Birth control/protection: Pill  Lifestyle  . Physical activity:    Days per week: Not on file    Minutes per session: Not on file  . Stress: Not on file  Relationships  . Social connections:    Talks on phone: Not on file    Gets together: Not on file    Attends religious service:  Not on file    Active member of club or organization: Not on file    Attends meetings of clubs or organizations: Not on file    Relationship status: Not on file  Other Topics Concern  . Not on file  Social History Narrative  . Not on file   Family History: Family History  Problem Relation Age of Onset  . Migraines Mother   . Thyroid disease Mother   . Hypertension Mother   . Miscarriages / [place] Mother   . Urolithiasis Father   . Hypertension Father   . Diabetes Father   . Nephrolithiasis Father   . Cancer Maternal Grandmother        lung ca  . COPD Maternal Grandmother   . Heart disease Maternal  Grandmother   . Diabetes Maternal Grandmother   . Depression Maternal Grandmother   . Alcohol abuse Maternal Grandfather   . Heart disease Maternal Grandfather   . Stroke Maternal Grandfather   . Stroke Paternal Grandmother   . Diabetes Paternal Grandmother   . Heart disease Paternal Grandfather   . Diabetes Paternal Grandfather    Allergies: Allergies  Allergen Reactions  . Citalopram Hydrobromide Itching    Pt states that this medication causes prolonged itching.....lasting for about a month.  Marland Kitchen Cymbalta [Duloxetine Hcl]    Medications: See med rec.  Review of Systems: No fevers, chills, night sweats, weight loss, chest pain, or shortness of breath.   Objective:    General: Speaking full sentences, no audible heavy breathing.  Sounds alert and appropriately interactive.  No other physical exam performed due to the non-face to face nature of this visit.  Impression and Recommendations:    LPRD (laryngopharyngeal reflux disease) Much better with protonix.  Mood disorder ([place]) Increasing Viibryd to 20mg  daily, followup appt in 1 month for PHQ/GAD. Refilling alprazolam one more time. We have simply had to do a much slower up taper.  DDD (degenerative disc disease), lumbar Patient has been endorsing some numbness and tingling and possibly weakness in the legs that is intermittent. Worse when sitting. On review of a CT scan from 3 years ago there does appear to be an L5-S1 disc protrusion, this is the likely culprit. I did advise that this would likely need to be discussed in person and an exam would need to be conducted in person, we can revisit this at the 1 month follow-up in the office.   I discussed the above assessment and treatment plan with the patient. The patient was provided an opportunity to ask questions and all were answered. The patient agreed with the plan and demonstrated an understanding of the instructions.   The patient was advised to call back or seek an  in-person evaluation if the symptoms worsen or if the condition fails to improve as anticipated.   I provided 21 minutes of non-face-to-face time during this encounter, less than 50% of this was time needed to gather information, review chart, records, and complete documentation.   ___________________________________________ Gwen Her. Dianah Field, M.D., ABFM., CAQSM. Primary Care and Sports Medicine [place] [facility]  Adjunct Professor of [place] of [facility] [employer]

## 2019-02-03 NOTE — Assessment & Plan Note (Addendum)
Increasing Viibryd to 20mg  daily, followup appt in 1 month for PHQ/GAD. Refilling alprazolam one more time. We have simply had to do a much slower up taper.

## 2019-02-03 NOTE — Assessment & Plan Note (Signed)
Patient has been endorsing some numbness and tingling and possibly weakness in the legs that is intermittent. Worse when sitting. On review of a CT scan from 3 years ago there does appear to be an L5-S1 disc protrusion, this is the likely culprit. I did advise that this would likely need to be discussed in person and an exam would need to be conducted in person, we can revisit this at the 1 month follow-up in the office.

## 2019-02-24 ENCOUNTER — Ambulatory Visit: Payer: 59 | Admitting: Sports Medicine

## 2019-02-24 ENCOUNTER — Encounter: Payer: Self-pay | Admitting: Sports Medicine

## 2019-02-24 ENCOUNTER — Other Ambulatory Visit: Payer: Self-pay

## 2019-02-24 ENCOUNTER — Ambulatory Visit (INDEPENDENT_AMBULATORY_CARE_PROVIDER_SITE_OTHER): Payer: 59

## 2019-02-24 VITALS — BP 131/84 | HR 98 | Temp 98.2°F | Ht 64.0 in | Wt 180.0 lb

## 2019-02-24 DIAGNOSIS — G5622 Lesion of ulnar nerve, left upper limb: Secondary | ICD-10-CM | POA: Insufficient documentation

## 2019-02-24 DIAGNOSIS — M5136 Other intervertebral disc degeneration, lumbar region: Secondary | ICD-10-CM

## 2019-02-24 DIAGNOSIS — M545 Low back pain: Secondary | ICD-10-CM | POA: Diagnosis not present

## 2019-02-24 DIAGNOSIS — N3001 Acute cystitis with hematuria: Secondary | ICD-10-CM

## 2019-02-24 DIAGNOSIS — R300 Dysuria: Secondary | ICD-10-CM | POA: Diagnosis not present

## 2019-02-24 DIAGNOSIS — M51369 Other intervertebral disc degeneration, lumbar region without mention of lumbar back pain or lower extremity pain: Secondary | ICD-10-CM

## 2019-02-24 DIAGNOSIS — F39 Unspecified mood [affective] disorder: Secondary | ICD-10-CM

## 2019-02-24 DIAGNOSIS — R3129 Other microscopic hematuria: Secondary | ICD-10-CM | POA: Diagnosis not present

## 2019-02-24 DIAGNOSIS — N3000 Acute cystitis without hematuria: Secondary | ICD-10-CM | POA: Insufficient documentation

## 2019-02-24 DIAGNOSIS — Z0184 Encounter for antibody response examination: Secondary | ICD-10-CM

## 2019-02-24 DIAGNOSIS — M4807 Spinal stenosis, lumbosacral region: Secondary | ICD-10-CM

## 2019-02-24 LAB — POCT URINALYSIS DIPSTICK
Bilirubin, UA: NEGATIVE
Glucose, UA: NEGATIVE
Ketones, UA: NEGATIVE
Nitrite, UA: NEGATIVE
Protein, UA: NEGATIVE
Spec Grav, UA: 1.025 (ref 1.010–1.025)
Urobilinogen, UA: 0.2 E.U./dL
pH, UA: 6 (ref 5.0–8.0)

## 2019-02-24 MED ORDER — CEPHALEXIN 500 MG PO CAPS: 500.0000 mg | ORAL_CAPSULE | Freq: Two times a day (BID) | ORAL | 0 refills | Status: DC

## 2019-02-24 MED ORDER — ALPRAZOLAM 0.5 MG PO TABS: 0.5000 mg | ORAL_TABLET | Freq: Every day | ORAL | 0 refills | Status: DC | PRN

## 2019-02-24 NOTE — Assessment & Plan Note (Signed)
Weakness in legs, numbness and tingling. Proceeding with x-ray/MRI, she likely has lumbar DDD, we will likely proceed with epidural. Has failed greater than 6 weeks of physician directed conservative measures.

## 2019-02-24 NOTE — Assessment & Plan Note (Signed)
We will start with night splinting, return to see me if no better in 4 weeks and we can try a ulnar nerve hydrodissection at the cubital tunnel.

## 2019-02-24 NOTE — Patient Instructions (Signed)
Cubital Tunnel Syndrome  Cubital tunnel syndrome is a condition that causes pain and weakness of the forearm and hand. It happens when one of the nerves that runs along the inside of the elbow joint (ulnar nerve) becomes irritated. This condition is usually caused by repeated arm motions that are done during sports or work-related activities. What are the causes? This condition may be caused by:  Increased pressure on the ulnar nerve at the elbow, arm, or forearm. This can result from: ? Irritation caused by repeated elbow bending. ? Poorly healed elbow fractures. ? Tumors in the elbow. These are usually noncancerous (benign). ? Scar tissue that develops in the elbow after an injury. ? Bony growths (spurs) near the ulnar nerve.  Stretching of the nerve due to loose elbow ligaments.  Trauma to the nerve at the elbow. What increases the risk? The following factors may make you more likely to develop this condition:  Doing manual labor that requires frequent bending of the elbow.  Playing sports that include repeated or strenuous throwing motions, such as baseball.  Playing contact sports, such as football or lacrosse.  Not warming up properly before activities.  Having diabetes.  Having an underactive thyroid (hypothyroidism). What are the signs or symptoms? Symptoms of this condition include:  Clumsiness or weakness of the hand.  Tenderness of the inner elbow.  Aching or soreness of the inner elbow, forearm, or fingers, especially the little finger or the ring finger.  Increased pain when forcing the elbow to bend.  Reduced control when throwing objects.  Tingling, numbness, or a burning feeling inside the forearm or in part of the hand or fingers, especially the little finger or the ring finger.  Sharp pains that shoot from the elbow down to the wrist and hand.  The inability to grip or pinch hard. How is this diagnosed? This condition is diagnosed based on:  Your  symptoms and medical history. Your health care provider will also ask for details about any injury.  A physical exam. You may also have tests, including:  Electromyogram (EMG). This test measures electrical signals sent by your nerves into the muscles.  Nerve conduction study. This test measures how well electrical signals pass through your nerves.  Imaging tests, such as X-rays, ultrasound, and MRI. These tests check for possible causes of your condition. How is this treated? This condition may be treated by:  Stopping the activities that are causing your symptoms to get worse.  Icing and taking medicines to reduce pain and swelling.  Wearing a splint to prevent your elbow from bending, or wearing an elbow pad where the ulnar nerve is closest to the skin.  Working with a physical therapist in less severe cases. This may help to: ? Decrease your symptoms. ? Improve the strength and range of motion of your elbow, forearm, and hand. If these treatments do not help, surgery may be needed. Follow these instructions at home: If you have a splint:  Wear the splint as told by your health care provider. Remove it only as told by your health care provider.  Loosen the splint if your fingers tingle, become numb, or turn cold and blue.  Keep the splint clean.  If the splint is not waterproof: ? Do not let it get wet. ? Cover it with a watertight covering when you take a bath or shower. Managing pain, stiffness, and swelling   If directed, put ice on the injured area: ? Put ice in a plastic bag. ?   Place a towel between your skin and the bag. ? Leave the ice on for 20 minutes, 2-3 times a day.  Move your fingers often to avoid stiffness and to lessen swelling.  Raise (elevate) the injured area above the level of your heart while you are sitting or lying down. General instructions  Take over-the-counter and prescription medicines only as told by your health care provider.  Do any  exercise or physical therapy as told by your health care provider.  Do not drive or use heavy machinery while taking prescription pain medicine.  If you were given an elbow pad, wear it as told by your health care provider.  Keep all follow-up visits as told by your health care provider. This is important. Contact a health care provider if:  Your symptoms get worse.  Your symptoms do not get better with treatment.  You have new pain.  Your hand on the injured side feels numb or cold. Summary  Cubital tunnel syndrome is a condition that causes pain and weakness of the forearm and hand.  You are more likely to develop this condition if you do work or play sports that involve repeated arm movements.  This condition is often treated by stopping repetitive activities, applying ice, and using anti-inflammatory medicines.  In rare cases, surgery may be needed. This information is not intended to replace advice given to you by your health care provider. Make sure you discuss any questions you have with your health care provider. Document Released: 10/08/2005 Document Revised: 02/24/2018 Document Reviewed: 02/24/2018 Elsevier Interactive Patient Education  2019 [employer] Inc.  

## 2019-02-24 NOTE — Assessment & Plan Note (Addendum)
Leukocytes and blood on urinalysis. Adding Keflex, return to see me if no better. Awaiting urine culture.  Urine culture is growing out E. coli, this may be resistant to Keflex so we are going to switch to a second generation cephalosporin, stop the Keflex, I am calling in the new antibiotic.

## 2019-02-24 NOTE — Progress Notes (Addendum)
Subjective:    CC: Multiple issues  HPI: Dysuria: Frequency, urgency, burning.  No flank pain, no constitutional symptoms.  Back pain: Present for many months now, axial, discogenic, has failed greater than 6 weeks of physician directed conservative measures.  Finger numbness: Left-sided, worse with her arm bent, she gets numbness and tingling into the ulnar half of her ring and the entire pinky finger.  No trauma, no neck pain.  Anxiety and depression: Doing well with Viibryd at 20 mg, needs a refill on alprazolam.  Exposure: Desires antibody testing for coronavirus.  I reviewed the past medical history, family history, social history, surgical history, and allergies today and no changes were needed.  Please see the problem list section below in epic for further details.  Past Medical History: Past Medical History:  Diagnosis Date  . Alopecia   . Anemia   . Anxiety   . Depression   . Family history of adverse reaction to anesthesia    PATERNAL GRANDFATHER HAD UNKNOWN PROBLEM  MANY YRS AGO - "IT WASN'T A SERIOUS PROBLEM"  . Gastritis   . GERD (gastroesophageal reflux disease)   . Gestational diabetes    on glyburide  . Gestational diabetes mellitus, class A2 07/07/2011   does not normally have diabetes  . Headache(784.0)    migraine  . Kidney stones   . OCD (obsessive compulsive disorder)   . Tremor    "familial tremor"   Past Surgical History: Past Surgical History:  Procedure Laterality Date  . CYSTOSCOPY W/ URETERAL STENT PLACEMENT Left 09/22/2015   Procedure: CYSTOSCOPY WITH LEFT  RETROGRADE PYELOGRAM/ STENT PLACEMENT;  Surgeon: Carolan Clines, MD;  Location: [facility];  Service: Urology;  Laterality: Left;  . MOUTH SURGERY     wisdom teeth and gum x2   Social History: Social History   Socioeconomic History  . Marital status: Married    Spouse name: Not on file  . Number of children: Not on file  . Years of education: Not on file  . Highest education level:  Not on file  Occupational History  . Not on file  Social Needs  . Financial resource strain: Not on file  . Food insecurity:    Worry: Not on file    Inability: Not on file  . Transportation needs:    Medical: Not on file    Non-medical: Not on file  Tobacco Use  . Smoking status: Never Smoker  . Smokeless tobacco: Never Used  Substance and Sexual Activity  . Alcohol use: Yes    Comment: occasional  . Drug use: No  . Sexual activity: Yes    Birth control/protection: Pill  Lifestyle  . Physical activity:    Days per week: Not on file    Minutes per session: Not on file  . Stress: Not on file  Relationships  . Social connections:    Talks on phone: Not on file    Gets together: Not on file    Attends religious service: Not on file    Active member of club or organization: Not on file    Attends meetings of clubs or organizations: Not on file    Relationship status: Not on file  Other Topics Concern  . Not on file  Social History Narrative  . Not on file   Family History: Family History  Problem Relation Age of Onset  . Migraines Mother   . Thyroid disease Mother   . Hypertension Mother   . Miscarriages / [place] Mother   .  Urolithiasis Father   . Hypertension Father   . Diabetes Father   . Nephrolithiasis Father   . Cancer Maternal Grandmother        lung ca  . COPD Maternal Grandmother   . Heart disease Maternal Grandmother   . Diabetes Maternal Grandmother   . Depression Maternal Grandmother   . Alcohol abuse Maternal Grandfather   . Heart disease Maternal Grandfather   . Stroke Maternal Grandfather   . Stroke Paternal Grandmother   . Diabetes Paternal Grandmother   . Heart disease Paternal Grandfather   . Diabetes Paternal Grandfather    Allergies: Allergies  Allergen Reactions  . Citalopram Hydrobromide Itching    Pt states that this medication causes prolonged itching.....lasting for about a month.  Marland Kitchen Cymbalta [Duloxetine Hcl]     Medications: See med rec.  Review of Systems: No fevers, chills, night sweats, weight loss, chest pain, or shortness of breath.   Objective:    General: Well Developed, well nourished, and in no acute distress.  Neuro: Alert and oriented x3, extra-ocular muscles intact, sensation grossly intact.  HEENT: Normocephalic, atraumatic, pupils equal round reactive to light, neck supple, no masses, no lymphadenopathy, thyroid nonpalpable.  Skin: Warm and dry, no rashes. Cardiac: Regular rate and rhythm, no murmurs rubs or gallops, no lower extremity edema.  Respiratory: Clear to auscultation bilaterally. Not using accessory muscles, speaking in full sentences. Left Elbow: Unremarkable to inspection. Range of motion full pronation, supination, flexion, extension. Strength is full to all of the above directions Stable to varus, valgus stress. Negative moving valgus stress test. No discrete areas of tenderness to palpation. Ulnar nerve does not sublux. Minimally positive cubital tunnel Tinel's.  Elbow sleeve applied.  Urinalysis is positive for blood and leukocytes.  Impression and Recommendations:    Mood disorder ([place]) Doing well, she desires to continue Viibryd at 20 mg daily, refilling Xanax.  Acute cystitis Leukocytes and blood on urinalysis. Adding Keflex, return to see me if no better. Awaiting urine culture.  Urine culture is growing out E. coli, this may be resistant to Keflex so we are going to switch to a second generation cephalosporin, stop the Keflex, I am calling in the new antibiotic.  DDD (degenerative disc disease), lumbar Weakness in legs, numbness and tingling. Proceeding with x-ray/MRI, she likely has lumbar DDD, we will likely proceed with epidural. Has failed greater than 6 weeks of physician directed conservative measures.  Immunity status testing Patient desires testing for corona virus antibodies and influenza antibodies.  Cubital tunnel syndrome, left We  will start with night splinting, return to see me if no better in 4 weeks and we can try a ulnar nerve hydrodissection at the cubital tunnel.   ___________________________________________ Gwen Her. Dianah Field, M.D., ABFM., CAQSM. Primary Care and Sports Medicine [facility]  Adjunct Professor of [place] of [facility] [employer]

## 2019-02-24 NOTE — Assessment & Plan Note (Signed)
Doing well, she desires to continue Viibryd at 20 mg daily, refilling Xanax.

## 2019-02-24 NOTE — Assessment & Plan Note (Signed)
Patient desires testing for corona virus antibodies and influenza antibodies.

## 2019-02-25 ENCOUNTER — Encounter: Payer: Self-pay | Admitting: Sports Medicine

## 2019-02-26 ENCOUNTER — Encounter: Payer: Self-pay | Admitting: Sports Medicine

## 2019-02-26 LAB — URINE CULTURE
MICRO NUMBER:: 447016
SPECIMEN QUALITY:: ADEQUATE

## 2019-02-26 MED ORDER — CEFUROXIME AXETIL 500 MG PO TABS: 500.0000 mg | ORAL_TABLET | Freq: Two times a day (BID) | ORAL | 0 refills | Status: DC

## 2019-02-26 NOTE — Addendum Note (Signed)
Addended by: Silverio Decamp on: 02/26/2019 11:43 AM   Modules accepted: Orders

## 2019-03-02 LAB — INFLUENZA A & B ANTIBODIES
INFLUENZA TYPE A ANTIBODY SERUM: 1:128 {titer} — ABNORMAL HIGH
INFLUENZA TYPE B ANTIBODY SERUM: 1:16 {titer} — ABNORMAL HIGH

## 2019-03-02 LAB — SAR COV2 SEROLOGY (COVID19)AB(IGG),IA: SARS CoV2 AB IGG: NEGATIVE

## 2019-03-04 ENCOUNTER — Ambulatory Visit: Payer: 59 | Admitting: Sports Medicine

## 2019-03-08 ENCOUNTER — Other Ambulatory Visit: Payer: Self-pay

## 2019-03-08 ENCOUNTER — Ambulatory Visit (INDEPENDENT_AMBULATORY_CARE_PROVIDER_SITE_OTHER): Payer: 59

## 2019-03-08 DIAGNOSIS — M5136 Other intervertebral disc degeneration, lumbar region: Secondary | ICD-10-CM | POA: Diagnosis not present

## 2019-03-08 DIAGNOSIS — M4807 Spinal stenosis, lumbosacral region: Secondary | ICD-10-CM | POA: Diagnosis not present

## 2019-03-09 ENCOUNTER — Encounter: Payer: Self-pay | Admitting: Sports Medicine

## 2019-03-20 ENCOUNTER — Encounter: Payer: Self-pay | Admitting: Sports Medicine

## 2019-03-24 ENCOUNTER — Ambulatory Visit: Payer: 59 | Admitting: Sports Medicine

## 2019-04-23 ENCOUNTER — Encounter: Payer: Self-pay | Admitting: Sports Medicine

## 2019-04-23 DIAGNOSIS — Z13220 Encounter for screening for lipoid disorders: Secondary | ICD-10-CM

## 2019-04-27 DIAGNOSIS — Z13220 Encounter for screening for lipoid disorders: Secondary | ICD-10-CM | POA: Insufficient documentation

## 2019-04-29 ENCOUNTER — Encounter: Payer: Self-pay | Admitting: Sports Medicine

## 2019-04-29 LAB — CBC
HCT: 42.2 % (ref 35.0–45.0)
Hemoglobin: 13.6 g/dL (ref 11.7–15.5)
MCH: 25.7 pg — ABNORMAL LOW (ref 27.0–33.0)
MCHC: 32.2 g/dL (ref 32.0–36.0)
MCV: 79.8 fL — ABNORMAL LOW (ref 80.0–100.0)
MPV: 10.9 fL (ref 7.5–12.5)
Platelets: 314 10*3/uL (ref 140–400)
RBC: 5.29 10*6/uL — ABNORMAL HIGH (ref 3.80–5.10)
RDW: 13.1 % (ref 11.0–15.0)
WBC: 9.7 10*3/uL (ref 3.8–10.8)

## 2019-04-29 LAB — LIPID PANEL W/REFLEX DIRECT LDL
Cholesterol: 178 mg/dL (ref <200)
HDL: 61 mg/dL (ref >=50)
LDL Cholesterol (Calc): 99 mg/dL (calc)
Non-HDL Cholesterol (Calc): 117 mg/dL (calc) (ref <130)
Total CHOL/HDL Ratio: 2.9 (calc) (ref <5.0)
Triglycerides: 86 mg/dL (ref <150)

## 2019-04-29 LAB — COMPLETE METABOLIC PANEL WITH GFR
AG Ratio: 1.6 (calc) (ref 1.0–2.5)
ALT: 16 U/L (ref 6–29)
AST: 14 U/L (ref 10–30)
Albumin: 4.4 g/dL (ref 3.6–5.1)
Alkaline phosphatase (APISO): 68 U/L (ref 31–125)
BUN: 19 mg/dL (ref 7–25)
CO2: 25 mmol/L (ref 20–32)
Calcium: 9.8 mg/dL (ref 8.6–10.2)
Chloride: 105 mmol/L (ref 98–110)
Creat: 0.85 mg/dL (ref 0.50–1.10)
GFR, Est African American: 101 mL/min/{1.73_m2} (ref >=60)
GFR, Est Non African American: 88 mL/min/{1.73_m2} (ref >=60)
Globulin: 2.8 g/dL (calc) (ref 1.9–3.7)
Glucose, Bld: 111 mg/dL — ABNORMAL HIGH (ref 65–99)
Potassium: 4 mmol/L (ref 3.5–5.3)
Sodium: 139 mmol/L (ref 135–146)
Total Bilirubin: 0.5 mg/dL (ref 0.2–1.2)
Total Protein: 7.2 g/dL (ref 6.1–8.1)

## 2019-04-29 LAB — TSH: TSH: 1.84 mIU/L

## 2019-05-22 ENCOUNTER — Other Ambulatory Visit: Payer: Self-pay

## 2019-05-22 ENCOUNTER — Encounter: Payer: Self-pay | Admitting: Physician Assistant

## 2019-05-22 ENCOUNTER — Ambulatory Visit (HOSPITAL_BASED_OUTPATIENT_CLINIC_OR_DEPARTMENT_OTHER)
Admission: RE | Admit: 2019-05-22 | Discharge: 2019-05-22 | Disposition: A | Discharge status: Home / Self Care | Payer: 59 | Source: Ambulatory Visit | Attending: Physician Assistant | Admitting: Physician Assistant

## 2019-05-22 ENCOUNTER — Ambulatory Visit: Payer: 59 | Admitting: Physician Assistant

## 2019-05-22 VITALS — BP 136/78 | HR 88 | Temp 98.7°F | Wt 180.0 lb

## 2019-05-22 DIAGNOSIS — L659 Nonscarring hair loss, unspecified: Secondary | ICD-10-CM | POA: Insufficient documentation

## 2019-05-22 DIAGNOSIS — R5383 Other fatigue: Secondary | ICD-10-CM | POA: Diagnosis not present

## 2019-05-22 DIAGNOSIS — K219 Gastro-esophageal reflux disease without esophagitis: Secondary | ICD-10-CM

## 2019-05-22 DIAGNOSIS — R221 Localized swelling, mass and lump, neck: Secondary | ICD-10-CM | POA: Diagnosis not present

## 2019-05-22 DIAGNOSIS — E010 Iodine-deficiency related diffuse (endemic) goiter: Secondary | ICD-10-CM | POA: Diagnosis not present

## 2019-05-22 DIAGNOSIS — F39 Unspecified mood [affective] disorder: Secondary | ICD-10-CM

## 2019-05-22 DIAGNOSIS — G479 Sleep disorder, unspecified: Secondary | ICD-10-CM | POA: Diagnosis not present

## 2019-05-22 MED ORDER — VORTIOXETINE HBR 5 MG PO TABS: 5.0000 mg | ORAL_TABLET | Freq: Every day | ORAL | 0 refills | Status: DC

## 2019-05-22 NOTE — Patient Instructions (Signed)
Will get u/s.  Start trintellix 5mg  daily.

## 2019-05-22 NOTE — Progress Notes (Signed)
Subjective:    Patient ID: Charlotte Nelson, female    DOB: 02-Nov-1980, 38 y.o.   MRN: [id number]  HPI  Pt is a 38 yo female with mood disorder and OCD tendencies who presents to the clinic with some concerns.   She had to stop viibryd due to worsening her tremor. She has noticed her OCD,anxiety, depression have been worse. She has tried numerous medications but tends to have side effects or not work. She is unsure why she stopped trintellix but would like to try again. She does go to counseling every 2 weeks and that helps a lot. She is using garden of herb sleeping medicine OTC which is helping a lot. Her husband is a cop, she is in school full time and has 3 kids.   She is concerned today with losing diffuse hair. No patches of hair loss. Her periods have been lighter as well. Her mother has thyroid disease and she is concerned about that for her. Her TSH levels were just checked and normal but she feels like her neck is enlarged. She often has some problems swallowing but also has dyspepsia/LPRD. She is taking protonix. She also has some intermittent voice loss.   .. Active Ambulatory Problems    Diagnosis Date Noted  . Annual physical exam 05/12/2015  . Left nephrolithiasis 05/12/2015  . Essential tremor 05/12/2015  . Mood disorder ([place]) 05/12/2015  . Microcytosis 05/12/2015  . Dyspepsia 06/09/2015  . Migraine headache 07/07/2015  . Immunity status testing 05/01/2016  . Not immune to hepatitis B virus 05/03/2016  . LPRD (laryngopharyngeal reflux disease) 11/25/2017  . 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 DDD (degenerative disc disease), lumbar 02/03/2019  . Acute cystitis 02/24/2019  . Cubital tunnel syndrome, left 02/24/2019  . Screening for hyperlipidemia 04/27/2019  . Hair loss 05/22/2019   Resolved Ambulatory Problems    Diagnosis Date Noted  . Postpartum care following vaginal delivery (9/14) 07/07/2011  . Gestational diabetes mellitus, class A2  07/07/2011  . Thyroid nodule 07/07/2015  . Viral URI 08/01/2015  . Postnasal drip 09/02/2015  . Bilateral ankle pain 11/04/2015  . Fatigue 01/06/2016  . Focal neurological deficit 03/30/2016  . Laceration of skin of right hand 06/08/2016  . Acute abdominal pain in right lower quadrant 06/19/2016  . Lymphadenopathy, preauricular 11/25/2017   Past Medical History:  Diagnosis Date  . Alopecia   . Anemia   . Anxiety   . Depression   . Family history of adverse reaction to anesthesia   . Gastritis   . GERD (gastroesophageal reflux disease)   . Gestational diabetes   . Headache(784.0)   . Kidney stones   . OCD (obsessive compulsive disorder)   . Tremor       Review of Systems    see HPI.  Objective:   Physical Exam Vitals signs reviewed.  Constitutional:      Appearance: Normal appearance.  HENT:     Head: Normocephalic.     Comments: No patches of hair loss. No female patterned thinning either.  Neck:     Comments: Right neck fullness compared to the left on thyroid exam. No discrete masses. No tenderness.  Cardiovascular:     Rate and Rhythm: Normal rate and regular rhythm.     Pulses: Normal pulses.  Pulmonary:     Effort: Pulmonary effort is normal.     Breath sounds: Normal breath sounds.  Neurological:     General: No focal deficit present.  Mental Status: She is alert and oriented to person, place, and time.  Psychiatric:     Comments: Anxious.            Assessment & Plan:  Marland KitchenMarland KitchenEdia was seen today for anxiety.  Diagnoses and all orders for this visit:  Hair loss -     B12 and Folate Panel -     VITAMIN D 25 Hydroxy (Vit-D Deficiency, Fractures) -     Progesterone -     Estradiol -     FSH/LH  Trouble in sleeping -     B12 and Folate Panel -     VITAMIN D 25 Hydroxy (Vit-D Deficiency, Fractures)  No energy -     B12 and Folate Panel -     VITAMIN D 25 Hydroxy (Vit-D Deficiency, Fractures) -     Progesterone -     Estradiol -      FSH/LH  Neck fullness -     US THYROID  LPRD (laryngopharyngeal reflux disease)  Mood disorder (HCC) -     vortioxetine HBr (TRINTELLIX) 5 MG TABS tablet; Take 1 tablet (5 mg total) by mouth daily.    Depression screen [facility] 2/9 05/22/2019 01/05/2019  Decreased Interest 0 0  Down, Depressed, Hopeless 1 1  PHQ - 2 Score 1 1  Altered sleeping 1 1  Tired, decreased energy 1 2  Change in appetite 0 1  Feeling bad or failure about yourself  1 0  Trouble concentrating 0 1  Moving slowly or fidgety/restless 0 0  Suicidal thoughts 0 0  PHQ-9 Score 4 6  Difficult doing work/chores Somewhat difficult Not difficult at all   .Marland Kitchen GAD 7 : Generalized Anxiety Score 05/22/2019 01/05/2019  Nervous, Anxious, on Edge 1 3  Control/stop worrying 2 1  Worry too much - different things 2 1  Trouble relaxing 2 1  Restless 0 1  Easily annoyed or irritable 2 1  Afraid - awful might happen 1 0  Total GAD 7 Score 10 8  Anxiety Difficulty Somewhat difficult Somewhat difficult    Will get thyroid u/s to confirm no nodules.  Will get some labs to look for other causes of fatigue/hair loss.  I certainty think the trouble swallowing and voice loss could be reflux. Add pepcid to protonix.  Encouraged biotin for hair.  Pt request hormones to be tested.   Restart trintellix. Start 2.5mg  dose for at least 2 weeks. Follow up with PCP. Continue counseling. Continue to sleep at least 6-7 hours at time. EXERCISE is the best medication.   Follow up with PCP in 4 weeks or sooner.   Marland Kitchen.Spent 30 minutes with patient and greater than 50 percent of visit spent counseling patient regarding treatment plan.

## 2019-05-22 NOTE — Progress Notes (Signed)
Thyroid u/s and soft tissue was completely normal!. No nodules. Great news!!

## 2019-05-23 LAB — FSH/LH
FSH: 5.9 m[IU]/mL
LH: 4.7 m[IU]/mL

## 2019-05-23 LAB — B12 AND FOLATE PANEL
Folate: 11.6 ng/mL
Vitamin B-12: 727 pg/mL (ref 200–1100)

## 2019-05-23 LAB — VITAMIN D 25 HYDROXY (VIT D DEFICIENCY, FRACTURES): Vit D, 25-Hydroxy: 37 ng/mL (ref 30–100)

## 2019-05-23 LAB — ESTRADIOL: Estradiol: 50 pg/mL

## 2019-05-23 LAB — PROGESTERONE: Progesterone: 0.5 ng/mL

## 2019-05-25 ENCOUNTER — Encounter: Payer: Self-pay | Admitting: Physician Assistant

## 2019-05-25 NOTE — Progress Notes (Signed)
b12 looks great.  Vitamin D looks good but sunlight 15 minutes a day and D3 1000 units could keep this up and maybe improve some.  Not in menopause or close too.  Hormones look good.

## 2019-06-27 ENCOUNTER — Other Ambulatory Visit: Payer: Self-pay | Admitting: Physician Assistant

## 2019-06-27 DIAGNOSIS — F39 Unspecified mood [affective] disorder: Secondary | ICD-10-CM

## 2019-08-22 ENCOUNTER — Other Ambulatory Visit: Payer: Self-pay | Admitting: Sports Medicine

## 2019-08-22 DIAGNOSIS — G250 Essential tremor: Secondary | ICD-10-CM

## 2019-09-09 ENCOUNTER — Telehealth: Payer: Self-pay | Admitting: Sports Medicine

## 2019-09-09 NOTE — Telephone Encounter (Signed)
Patient called and reported that she felt her heart racing before lunch but she had been sitting down and not doing anything strenuous. This has subsided and is not a concern at this time.   She was not feeling well after lunch and had her sugar checked by a nurse at her work and it was 36. I let her know that we did not have any openings and she was advised to follow up with Urgent Care since she wanted to be evaluated today and she declined. She is feeling shaky but no other symptoms and is concerned she may be possible diabetic. She was eating chips to bring it up.  Please advise.

## 2019-09-09 NOTE — Telephone Encounter (Signed)
Patient is aware and only wanted to see her PCP. She is going to keep an eye on it and will let us know if she needs to be seen sooner. Patient is aware to go the urgent care or ER if her symptoms get worse. No other questions at this time.

## 2019-09-09 NOTE — Telephone Encounter (Signed)
It looks like Dr. Madilyn Fireman has some openings tomorrow.

## 2019-09-11 ENCOUNTER — Encounter: Payer: Self-pay | Admitting: Sports Medicine

## 2019-09-11 ENCOUNTER — Other Ambulatory Visit: Payer: Self-pay

## 2019-09-11 ENCOUNTER — Ambulatory Visit: Payer: 59 | Admitting: Sports Medicine

## 2019-09-11 DIAGNOSIS — R002 Palpitations: Secondary | ICD-10-CM | POA: Diagnosis not present

## 2019-09-11 DIAGNOSIS — G250 Essential tremor: Secondary | ICD-10-CM

## 2019-09-11 LAB — GLUCOSE, POCT (MANUAL RESULT ENTRY): POC Glucose: 131 mg/dl — AB (ref 70–99)

## 2019-09-11 MED ORDER — PRIMIDONE 50 MG PO TABS: 50.0000 mg | ORAL_TABLET | Freq: Every day | ORAL | 3 refills | Status: DC

## 2019-09-11 NOTE — Assessment & Plan Note (Addendum)
Patient thought these were related to low blood sugar but she has no reason to be hypoglycemic, blood sugar today was normal, and during episodes it typically is normal, the lowest it has been is in the 70s and 80s. Symptoms are likely more cardiac, likely benign palpitations, ECG today, Holter monitoring, echocardiogram, return to see me to go over Holter monitor results.

## 2019-09-11 NOTE — Progress Notes (Signed)
Subjective:    CC: Palpitations  HPI: This is a pleasant 38 year old female with a history of anxiety, recently she has had increasing episodes of palpitations, it feels like a hole in her chest, she becomes slightly short of breath, a little bit presyncopal, she may shake a little bit, no nausea, vomiting, chest pain, diaphoresis.  These episodes resolve quickly.  She has checked her blood sugar during the episodes and they are typically in the 70s and 80s.  She does have a history of gestational diabetes.  She does have a benign essential tremor, did not respond to propranolol prescribed by another provider.  I reviewed the past medical history, family history, social history, surgical history, and allergies today and no changes were needed.  Please see the problem list section below in epic for further details.  Past Medical History: Past Medical History:  Diagnosis Date  . Alopecia   . Anemia   . Anxiety   . Depression   . Family history of adverse reaction to anesthesia    PATERNAL GRANDFATHER HAD UNKNOWN PROBLEM  MANY YRS AGO - "IT WASN'T A SERIOUS PROBLEM"  . Gastritis   . GERD (gastroesophageal reflux disease)   . Gestational diabetes    on glyburide  . Gestational diabetes mellitus, class A2 07/07/2011   does not normally have diabetes  . Headache(784.0)    migraine  . Kidney stones   . OCD (obsessive compulsive disorder)   . Tremor    "familial tremor"   Past Surgical History: Past Surgical History:  Procedure Laterality Date  . CYSTOSCOPY W/ URETERAL STENT PLACEMENT Left 09/22/2015   Procedure: CYSTOSCOPY WITH LEFT  RETROGRADE PYELOGRAM/ STENT PLACEMENT;  Surgeon: Carolan Clines, MD;  Location: [facility];  Service: Urology;  Laterality: Left;  . MOUTH SURGERY     wisdom teeth and gum x2   Social History: Social History   Socioeconomic History  . Marital status: Married    Spouse name: Not on file  . Number of children: Not on file  . Years of education: Not  on file  . Highest education level: Not on file  Occupational History  . Not on file  Social Needs  . Financial resource strain: Not on file  . Food insecurity    Worry: Not on file    Inability: Not on file  . Transportation needs    Medical: Not on file    Non-medical: Not on file  Tobacco Use  . Smoking status: Never Smoker  . Smokeless tobacco: Never Used  Substance and Sexual Activity  . Alcohol use: Yes    Comment: occasional  . Drug use: No  . Sexual activity: Yes    Birth control/protection: Pill  Lifestyle  . Physical activity    Days per week: Not on file    Minutes per session: Not on file  . Stress: Not on file  Relationships  . Social Herbalist on phone: Not on file    Gets together: Not on file    Attends religious service: Not on file    Active member of club or organization: Not on file    Attends meetings of clubs or organizations: Not on file    Relationship status: Not on file  Other Topics Concern  . Not on file  Social History Narrative  . Not on file   Family History: Family History  Problem Relation Age of Onset  . Migraines Mother   . Thyroid disease Mother   .  Hypertension Mother   . Miscarriages / [place] Mother   . Urolithiasis Father   . Hypertension Father   . Diabetes Father   . Nephrolithiasis Father   . Cancer Maternal Grandmother        lung ca  . COPD Maternal Grandmother   . Heart disease Maternal Grandmother   . Diabetes Maternal Grandmother   . Depression Maternal Grandmother   . Alcohol abuse Maternal Grandfather   . Heart disease Maternal Grandfather   . Stroke Maternal Grandfather   . Stroke Paternal Grandmother   . Diabetes Paternal Grandmother   . Heart disease Paternal Grandfather   . Diabetes Paternal Grandfather    Allergies: Allergies  Allergen Reactions  . Citalopram Hydrobromide Itching    Pt states that this medication causes prolonged itching.....lasting for about a month.  . Cymbalta  [Duloxetine Hcl]   . Viibryd [Vilazodone Hcl]     Tremor worsening.    Medications: See med rec.  Review of Systems: No fevers, chills, night sweats, weight loss, chest pain, or shortness of breath.   Objective:    General: Well Developed, well nourished, and in no acute distress.  Neuro: Alert and oriented x3, extra-ocular muscles intact, sensation grossly intact.  HEENT: Normocephalic, atraumatic, pupils equal round reactive to light, neck supple, no masses, no lymphadenopathy, thyroid nonpalpable.  Skin: Warm and dry, no rashes. Cardiac: Regular rate and rhythm, no murmurs rubs or gallops, no lower extremity edema.  Respiratory: Clear to auscultation bilaterally. Not using accessory muscles, speaking in full sentences.  Twelve-lead ECG personally reviewed, normal rate, normal rhythm, normal axis, nonspecific T wave inversions in V2, V3, otherwise normal.  Impression and Recommendations:    Essential tremor Propranolol not effective, switching to primidone.  Palpitations Patient thought these were related to low blood sugar but she has no reason to be hypoglycemic, blood sugar today was normal, and during episodes it typically is normal, the lowest it has been is in the 70s and 80s. Symptoms are likely more cardiac, likely benign palpitations, ECG today, Holter monitoring, echocardiogram, return to see me to go over Holter monitor results.   ___________________________________________ Gwen Her. Dianah Field, M.D., ABFM., CAQSM. Primary Care and Sports Medicine [place] [facility]  Adjunct Professor of [place] [employer] [facility] of Medicine

## 2019-09-11 NOTE — Assessment & Plan Note (Signed)
Propranolol not effective, switching to primidone.

## 2019-09-12 LAB — CBC
HCT: 42.3 % (ref 35.0–45.0)
Hemoglobin: 13.6 g/dL (ref 11.7–15.5)
MCH: 25.7 pg — ABNORMAL LOW (ref 27.0–33.0)
MCHC: 32.2 g/dL (ref 32.0–36.0)
MCV: 79.8 fL — ABNORMAL LOW (ref 80.0–100.0)
MPV: 10.7 fL (ref 7.5–12.5)
Platelets: 314 10*3/uL (ref 140–400)
RBC: 5.3 10*6/uL — ABNORMAL HIGH (ref 3.80–5.10)
RDW: 13 % (ref 11.0–15.0)
WBC: 9.9 10*3/uL (ref 3.8–10.8)

## 2019-09-12 LAB — COMPLETE METABOLIC PANEL WITH GFR
AG Ratio: 1.4 (calc) (ref 1.0–2.5)
ALT: 15 U/L (ref 6–29)
AST: 14 U/L (ref 10–30)
Albumin: 4.3 g/dL (ref 3.6–5.1)
Alkaline phosphatase (APISO): 72 U/L (ref 31–125)
BUN: 19 mg/dL (ref 7–25)
CO2: 27 mmol/L (ref 20–32)
Calcium: 9.5 mg/dL (ref 8.6–10.2)
Chloride: 106 mmol/L (ref 98–110)
Creat: 0.89 mg/dL (ref 0.50–1.10)
GFR, Est African American: 96 mL/min/{1.73_m2} (ref >=60)
GFR, Est Non African American: 83 mL/min/{1.73_m2} (ref >=60)
Globulin: 3.1 g/dL (calc) (ref 1.9–3.7)
Glucose, Bld: 93 mg/dL (ref 65–99)
Potassium: 4 mmol/L (ref 3.5–5.3)
Sodium: 141 mmol/L (ref 135–146)
Total Bilirubin: 0.3 mg/dL (ref 0.2–1.2)
Total Protein: 7.4 g/dL (ref 6.1–8.1)

## 2019-09-12 LAB — TSH: TSH: 2.06 mIU/L

## 2019-09-12 LAB — HEMOGLOBIN A1C
Hgb A1c MFr Bld: 5.7 % of total Hgb — ABNORMAL HIGH (ref <5.7)
Mean Plasma Glucose: 117 (calc)
eAG (mmol/L): 6.5 (calc)

## 2019-09-16 ENCOUNTER — Ambulatory Visit (HOSPITAL_BASED_OUTPATIENT_CLINIC_OR_DEPARTMENT_OTHER): Admission: RE | Admit: 2019-09-16 | Payer: 59 | Source: Ambulatory Visit

## 2019-09-17 ENCOUNTER — Other Ambulatory Visit: Payer: Self-pay | Admitting: Sports Medicine

## 2019-09-17 DIAGNOSIS — K219 Gastro-esophageal reflux disease without esophagitis: Secondary | ICD-10-CM

## 2019-09-19 ENCOUNTER — Encounter: Payer: Self-pay | Admitting: Sports Medicine

## 2019-09-19 DIAGNOSIS — G250 Essential tremor: Secondary | ICD-10-CM

## 2019-09-22 NOTE — Telephone Encounter (Signed)
I messaged patient and told her if she did not hear about her monitor by end of day tomorrow to please call me on Thursday morning so I can see what the status is. - CF

## 2019-09-25 ENCOUNTER — Telehealth: Payer: Self-pay | Admitting: Radiology

## 2019-09-25 NOTE — Telephone Encounter (Signed)
Enrolled patient for a 14 day Zio monitor to be mailed. Brief instructions were gone over with patient and she knows to expect the monitor to arrive in 3-4 days.

## 2019-10-05 ENCOUNTER — Ambulatory Visit (HOSPITAL_BASED_OUTPATIENT_CLINIC_OR_DEPARTMENT_OTHER)
Admission: RE | Admit: 2019-10-05 | Discharge: 2019-10-05 | Disposition: A | Discharge status: Home / Self Care | Payer: 59 | Source: Ambulatory Visit | Attending: Sports Medicine | Admitting: Sports Medicine

## 2019-10-05 ENCOUNTER — Other Ambulatory Visit: Payer: Self-pay

## 2019-10-05 DIAGNOSIS — R002 Palpitations: Secondary | ICD-10-CM | POA: Insufficient documentation

## 2019-10-07 ENCOUNTER — Other Ambulatory Visit (INDEPENDENT_AMBULATORY_CARE_PROVIDER_SITE_OTHER): Payer: 59

## 2019-10-07 DIAGNOSIS — R002 Palpitations: Secondary | ICD-10-CM

## 2019-11-06 ENCOUNTER — Other Ambulatory Visit: Payer: Self-pay

## 2019-11-06 ENCOUNTER — Encounter: Payer: Self-pay | Admitting: Sports Medicine

## 2019-11-06 ENCOUNTER — Ambulatory Visit (INDEPENDENT_AMBULATORY_CARE_PROVIDER_SITE_OTHER): Payer: 59 | Admitting: Sports Medicine

## 2019-11-06 DIAGNOSIS — R002 Palpitations: Secondary | ICD-10-CM | POA: Diagnosis not present

## 2019-11-06 DIAGNOSIS — M25531 Pain in right wrist: Secondary | ICD-10-CM | POA: Diagnosis not present

## 2019-11-06 DIAGNOSIS — J3089 Other allergic rhinitis: Secondary | ICD-10-CM | POA: Diagnosis not present

## 2019-11-06 MED ORDER — AZELASTINE HCL 0.1 % NA SOLN: 2.0000 | Freq: Two times a day (BID) | NASAL | 1 refills | Status: DC

## 2019-11-06 NOTE — Assessment & Plan Note (Signed)
Charlotte Nelson returns, she gets occasional palpitations. Holter monitor reviewed, she only has rare PVCs, no heart block, no pauses of greater than 3 seconds, no V. tach, no A. Fib. She does have a structurally normal heart on echo, so we can simply watch this for now. Certainly if they become intolerable we could add Bystolic.

## 2019-11-06 NOTE — Assessment & Plan Note (Signed)
Charlotte Nelson has a year-round runny nose with postnasal drip syndrome, sore throat and resultant cough. She has tried a course of PPI without much improvement. Has tried Flonase, over-the-counter agents without any improvement. Adding nasal azelastine.

## 2019-11-06 NOTE — Assessment & Plan Note (Signed)
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 Exam is benign. For the most part better, she will get a wrist sleeve, and do some home exercises. Return as needed for this.

## 2019-11-06 NOTE — Progress Notes (Signed)
    Procedures performed today:    None.  Independent interpretation of tests performed by another provider:   None.  Impression and Recommendations:    Palpitations Gretchyn returns, she gets occasional palpitations. Holter monitor reviewed, she only has rare PVCs, no heart block, no pauses of greater than 3 seconds, no V. tach, no A. Fib. She does have a structurally normal heart on echo, so we can simply watch this for now. Certainly if they become intolerable we could add Bystolic.  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 Exam is benign. For the most part better, she will get a wrist sleeve, and do some home exercises. Return as needed for this.  Perennial allergic rhinitis Clotene has a year-round runny nose with postnasal drip syndrome, sore throat and resultant cough. She has tried a course of PPI without much improvement. Has tried Flonase, over-the-counter agents without any improvement. Adding nasal azelastine.    ___________________________________________ Gwen Her. Dianah Field, M.D., ABFM., CAQSM. Primary Care and [place] Instructor of [place] of [facility] of Medicine

## 2019-12-04 ENCOUNTER — Encounter: Payer: Self-pay | Admitting: Sports Medicine

## 2019-12-04 ENCOUNTER — Other Ambulatory Visit: Payer: Self-pay

## 2019-12-04 ENCOUNTER — Ambulatory Visit: Payer: 59 | Admitting: Sports Medicine

## 2019-12-04 DIAGNOSIS — R300 Dysuria: Secondary | ICD-10-CM

## 2019-12-04 DIAGNOSIS — J3089 Other allergic rhinitis: Secondary | ICD-10-CM

## 2019-12-04 LAB — POCT URINALYSIS DIP (CLINITEK)
Bilirubin, UA: NEGATIVE
Glucose, UA: NEGATIVE mg/dL
Ketones, POC UA: NEGATIVE mg/dL
Leukocytes, UA: NEGATIVE
Nitrite, UA: NEGATIVE
POC PROTEIN,UA: NEGATIVE
Spec Grav, UA: >=1.03 — AB (ref 1.010–1.025)
Urobilinogen, UA: 0.2 E.U./dL
pH, UA: 5.5 (ref 5.0–8.0)

## 2019-12-04 MED ORDER — IPRATROPIUM BROMIDE 0.06 % NA SOLN: 2.0000 | Freq: Four times a day (QID) | NASAL | 12 refills | Status: DC

## 2019-12-04 NOTE — Assessment & Plan Note (Signed)
Charlotte Nelson continues to have a year-round runny nose with postnasal drip syndrome, sore throat and resultant cough. We have tried a course of proton pump inhibitors without improvement, we tried Flonase as well as other over-the-counter agents nasally without any improvement. Most recently we added nasal azelastine, she has had minimal drying but continues to have postnasal drip symptoms. At this point we are going to switch to nasal Atrovent and I would like an ENT consult.

## 2019-12-04 NOTE — Progress Notes (Signed)
    Procedures performed today:    None.  Independent interpretation of tests performed by another provider:   Urinalysis personally reviewed and shows hematuria without leukocytes or nitrites.  High specific gravity as well.  Impression and Recommendations:    Dysuria Other has had frequent UTIs. More recently she went to an urgent care, she was prescribed some antibiotics for UTI but did not finish the course. She is now having recurrence of symptoms, urinalysis shows blood only, no nitrites or leukocytes. She is however on her menstrual cycle. We are going to culture the urine, I am not going to call in any antibiotics and she will hydrate herself aggressively.  Perennial allergic rhinitis Charlotte Nelson continues to have a year-round runny nose with postnasal drip syndrome, sore throat and resultant cough. We have tried a course of proton pump inhibitors without improvement, we tried Flonase as well as other over-the-counter agents nasally without any improvement. Most recently we added nasal azelastine, she has had minimal drying but continues to have postnasal drip symptoms. At this point we are going to switch to nasal Atrovent and I would like an ENT consult.    ___________________________________________ Gwen Her. Dianah Field, M.D., ABFM., CAQSM. Primary Care and [place] [employer] of [place] [facility]

## 2019-12-04 NOTE — Assessment & Plan Note (Signed)
Other has had frequent UTIs. More recently she went to an urgent care, she was prescribed some antibiotics for UTI but did not finish the course. She is now having recurrence of symptoms, urinalysis shows blood only, no nitrites or leukocytes. She is however on her menstrual cycle. We are going to culture the urine, I am not going to call in any antibiotics and she will hydrate herself aggressively.

## 2019-12-05 LAB — URINE CULTURE
MICRO NUMBER:: 10147068
SPECIMEN QUALITY:: ADEQUATE

## 2019-12-17 MED ORDER — AZITHROMYCIN 250 MG PO TABS: ORAL_TABLET | ORAL | 0 refills | Status: DC

## 2020-02-18 ENCOUNTER — Other Ambulatory Visit: Payer: Self-pay | Admitting: Sports Medicine

## 2020-02-18 DIAGNOSIS — K219 Gastro-esophageal reflux disease without esophagitis: Secondary | ICD-10-CM

## 2020-05-13 LAB — OB RESULTS CONSOLE RUBELLA ANTIBODY, IGM: Rubella: IMMUNE

## 2020-05-13 LAB — OB RESULTS CONSOLE HEPATITIS B SURFACE ANTIGEN: Hepatitis B Surface Ag: NEGATIVE

## 2020-05-13 LAB — OB RESULTS CONSOLE HIV ANTIBODY (ROUTINE TESTING): HIV: NONREACTIVE

## 2020-05-13 LAB — OB RESULTS CONSOLE RPR: RPR: NONREACTIVE

## 2020-05-30 LAB — OB RESULTS CONSOLE GC/CHLAMYDIA
Chlamydia: NEGATIVE
Gonorrhea: NEGATIVE

## 2020-07-30 ENCOUNTER — Other Ambulatory Visit (HOSPITAL_COMMUNITY): Payer: Self-pay | Admitting: Family

## 2020-07-30 ENCOUNTER — Ambulatory Visit (HOSPITAL_COMMUNITY)
Admission: RE | Admit: 2020-07-30 | Discharge: 2020-07-30 | Disposition: A | Discharge status: Home / Self Care | Payer: 59 | Source: Ambulatory Visit | Attending: Pulmonary Disease | Admitting: Pulmonary Disease

## 2020-07-30 DIAGNOSIS — U071 COVID-19: Secondary | ICD-10-CM | POA: Diagnosis not present

## 2020-07-30 DIAGNOSIS — O98519 Other viral diseases complicating pregnancy, unspecified trimester: Secondary | ICD-10-CM | POA: Insufficient documentation

## 2020-07-30 MED ORDER — EPINEPHRINE 0.3 MG/0.3ML IJ SOAJ: 0.3000 mg | Freq: Once | INTRAMUSCULAR | Status: DC | PRN

## 2020-07-30 MED ORDER — ACETAMINOPHEN 325 MG PO TABS
650.0000 mg | ORAL_TABLET | Freq: Once | ORAL | Status: AC
  Administered 2020-07-30: 650 mg via ORAL
  Filled 2020-07-30: qty 2

## 2020-07-30 MED ORDER — DIPHENHYDRAMINE HCL 50 MG/ML IJ SOLN: 50.0000 mg | Freq: Once | INTRAMUSCULAR | Status: DC | PRN

## 2020-07-30 MED ORDER — ALBUTEROL SULFATE HFA 108 (90 BASE) MCG/ACT IN AERS: 2.0000 | INHALATION_SPRAY | Freq: Once | RESPIRATORY_TRACT | Status: DC | PRN

## 2020-07-30 MED ORDER — SODIUM CHLORIDE 0.9 % IV SOLN: Freq: Once | INTRAVENOUS | Status: AC

## 2020-07-30 MED ORDER — FAMOTIDINE IN NACL 20-0.9 MG/50ML-% IV SOLN: 20.0000 mg | Freq: Once | INTRAVENOUS | Status: DC | PRN

## 2020-07-30 MED ORDER — SODIUM CHLORIDE 0.9 % IV SOLN: INTRAVENOUS | Status: DC | PRN

## 2020-07-30 MED ORDER — METHYLPREDNISOLONE SODIUM SUCC 125 MG IJ SOLR: 125.0000 mg | Freq: Once | INTRAMUSCULAR | Status: DC | PRN

## 2020-07-30 NOTE — Progress Notes (Signed)
I connected by phone with Clemmie Krill on 07/30/2020 at 3:58 PM to discuss the potential use of a new treatment for mild to moderate COVID-19 viral infection in non-hospitalized patients.  This patient is a 39 y.o. female that meets the FDA criteria for Emergency Use Authorization of COVID monoclonal antibody casirivimab/imdevimab or bamlanivimab/eteseviamb.  Has a (+) direct SARS-CoV-2 viral test result  Has mild or moderate COVID-19   Is NOT hospitalized due to COVID-19  Is within 10 days of symptom onset  Has at least one of the high risk factor(s) for progression to severe COVID-19 and/or hospitalization as defined in EUA.  Specific high risk criteria : Pregnancy Symptoms of dizziness, aches, and head cold with onset of 10/9.  I have spoken and communicated the following to the patient or parent/caregiver regarding COVID monoclonal antibody treatment:  1. FDA has authorized the emergency use for the treatment of mild to moderate COVID-19 in adults and pediatric patients with positive results of direct SARS-CoV-2 viral testing who are 61 years of age and older weighing at least 40 kg, and who are at high risk for progressing to severe COVID-19 and/or hospitalization.  2. The significant known and potential risks and benefits of COVID monoclonal antibody, and the extent to which such potential risks and benefits are unknown.  3. Information on available alternative treatments and the risks and benefits of those alternatives, including clinical trials.  4. Patients treated with COVID monoclonal antibody should continue to self-isolate and use infection control measures (e.g., wear mask, isolate, social distance, avoid sharing personal items, clean and disinfect "high touch" surfaces, and frequent handwashing) according to CDC guidelines.   5. The patient or parent/caregiver has the option to accept or refuse COVID monoclonal antibody treatment.  After reviewing this information with  the patient, she decided to have the infusion.  Asencion Gowda, NP 07/30/2020 3:58 PM

## 2020-07-30 NOTE — Progress Notes (Signed)
Patient ID: Charlotte Nelson, female   DOB: December 11, 1980, 39 y.o.   MRN: [id number]  Diagnosis: [id number]  Physician:  Procedure: [facility] Med: bamlanivimab\etesevimab infusion - Provided patient with bamlanimivab\etesevimab fact sheet for patients, parents and caregivers prior to infusion.  Complications: No immediate complications noted.  Discharge: Discharged home   Evon Slack 07/30/2020

## 2020-07-30 NOTE — Discharge Instructions (Signed)

## 2020-07-31 ENCOUNTER — Ambulatory Visit (HOSPITAL_COMMUNITY): Payer: 59

## 2020-08-11 ENCOUNTER — Telehealth: Payer: 59 | Admitting: Sports Medicine

## 2020-10-22 NOTE — L&D Delivery Note (Signed)
Delivery Note At 3:29 PM a viable and healthy female was delivered via Vaginal, Spontaneous (Presentation: Left Occiput Anterior).  APGAR: 8, 9; weight 9 lb 6.1 oz (4255 g).   Placenta status: Spontaneous, Intact.  Cord: 3 vessels with the following complications: None.  Cord pH: na  Anesthesia: Epidural Episiotomy: None Lacerations: None Suture Repair: na Est. Blood Loss (mL): 100  Mom to postpartum.  Baby to Couplet care / Skin to Skin.  Damaris Abeln J 11/16/2020, 5:08 PM

## 2020-10-28 ENCOUNTER — Inpatient Hospital Stay (HOSPITAL_COMMUNITY)
Admission: AD | Admit: 2020-10-28 | Discharge: 2020-10-29 | Disposition: A | Discharge status: Home / Self Care | Payer: 59 | Attending: Obstetrics | Admitting: Obstetrics

## 2020-10-28 DIAGNOSIS — Z3A34 34 weeks gestation of pregnancy: Secondary | ICD-10-CM | POA: Insufficient documentation

## 2020-10-28 DIAGNOSIS — O1493 Unspecified pre-eclampsia, third trimester: Secondary | ICD-10-CM | POA: Insufficient documentation

## 2020-10-28 DIAGNOSIS — Z3689 Encounter for other specified antenatal screening: Secondary | ICD-10-CM

## 2020-10-28 DIAGNOSIS — O24414 Gestational diabetes mellitus in pregnancy, insulin controlled: Secondary | ICD-10-CM | POA: Insufficient documentation

## 2020-10-28 HISTORY — DX: Gestational (pregnancy-induced) hypertension without significant proteinuria, unspecified trimester: O13.9

## 2020-10-29 ENCOUNTER — Encounter (HOSPITAL_COMMUNITY): Payer: Self-pay | Admitting: *Deleted

## 2020-10-29 DIAGNOSIS — O1493 Unspecified pre-eclampsia, third trimester: Secondary | ICD-10-CM

## 2020-10-29 DIAGNOSIS — R519 Headache, unspecified: Secondary | ICD-10-CM | POA: Diagnosis present

## 2020-10-29 DIAGNOSIS — Z3A34 34 weeks gestation of pregnancy: Secondary | ICD-10-CM | POA: Diagnosis not present

## 2020-10-29 DIAGNOSIS — O24414 Gestational diabetes mellitus in pregnancy, insulin controlled: Secondary | ICD-10-CM | POA: Diagnosis not present

## 2020-10-29 LAB — URINALYSIS, ROUTINE W REFLEX MICROSCOPIC
Bilirubin Urine: NEGATIVE
Glucose, UA: NEGATIVE mg/dL
Hgb urine dipstick: NEGATIVE
Ketones, ur: NEGATIVE mg/dL
Leukocytes,Ua: NEGATIVE
Nitrite: NEGATIVE
Protein, ur: 100 mg/dL — AB
Specific Gravity, Urine: 1.019 (ref 1.005–1.030)
pH: 6 (ref 5.0–8.0)

## 2020-10-29 LAB — PROTEIN / CREATININE RATIO, URINE
Creatinine, Urine: 119.5 mg/dL
Protein Creatinine Ratio: 0.65 mg/mg{Cre} — ABNORMAL HIGH (ref 0.00–0.15)
Total Protein, Urine: 78 mg/dL

## 2020-10-29 LAB — CBC
HCT: 40 % (ref 36.0–46.0)
Hemoglobin: 12.8 g/dL (ref 12.0–15.0)
MCH: 25.3 pg — ABNORMAL LOW (ref 26.0–34.0)
MCHC: 32 g/dL (ref 30.0–36.0)
MCV: 79.2 fL — ABNORMAL LOW (ref 80.0–100.0)
Platelets: 174 10*3/uL (ref 150–400)
RBC: 5.05 MIL/uL (ref 3.87–5.11)
RDW: 15.2 % (ref 11.5–15.5)
WBC: 10.3 10*3/uL (ref 4.0–10.5)
nRBC: 0 % (ref 0.0–0.2)

## 2020-10-29 LAB — COMPREHENSIVE METABOLIC PANEL
ALT: 28 U/L (ref 0–44)
AST: 22 U/L (ref 15–41)
Albumin: 2.6 g/dL — ABNORMAL LOW (ref 3.5–5.0)
Alkaline Phosphatase: 96 U/L (ref 38–126)
Anion gap: 12 (ref 5–15)
BUN: 17 mg/dL (ref 6–20)
CO2: 18 mmol/L — ABNORMAL LOW (ref 22–32)
Calcium: 9.4 mg/dL (ref 8.9–10.3)
Chloride: 108 mmol/L (ref 98–111)
Creatinine, Ser: 0.96 mg/dL (ref 0.44–1.00)
GFR, Estimated: >60 mL/min (ref >60)
Glucose, Bld: 142 mg/dL — ABNORMAL HIGH (ref 70–99)
Potassium: 4 mmol/L (ref 3.5–5.1)
Sodium: 138 mmol/L (ref 135–145)
Total Bilirubin: 0.5 mg/dL (ref 0.3–1.2)
Total Protein: 5.9 g/dL — ABNORMAL LOW (ref 6.5–8.1)

## 2020-10-29 MED ORDER — BUTALBITAL-APAP-CAFFEINE 50-325-40 MG PO TABS: 1.0000 | ORAL_TABLET | Freq: Four times a day (QID) | ORAL | 0 refills | Status: DC | PRN

## 2020-10-29 MED ORDER — FAMOTIDINE 20 MG PO TABS
20.0000 mg | ORAL_TABLET | Freq: Once | ORAL | Status: AC
  Administered 2020-10-29: 20 mg via ORAL
  Filled 2020-10-29: qty 1

## 2020-10-29 MED ORDER — BUTALBITAL-APAP-CAFFEINE 50-325-40 MG PO TABS
2.0000 | ORAL_TABLET | Freq: Once | ORAL | Status: AC
  Administered 2020-10-29: 2 via ORAL
  Filled 2020-10-29: qty 2

## 2020-10-29 NOTE — Discharge Instructions (Signed)
Preeclampsia and Eclampsia Preeclampsia is a serious condition that may develop during pregnancy. This condition causes high blood pressure and increased protein in your urine along with other symptoms, such as headaches and vision changes. These symptoms may develop as the condition gets worse. Preeclampsia may occur at 20 weeks of pregnancy or later. Diagnosing and treating preeclampsia early is very important. If not treated early, it can cause serious problems for you and your baby. One problem it can lead to is eclampsia. Eclampsia is a condition that causes muscle jerking or shaking (convulsions or seizures) and other serious problems for the mother. During pregnancy, delivering your baby may be the best treatment for preeclampsia or eclampsia. For most women, preeclampsia and eclampsia symptoms go away after giving birth. In rare cases, a woman may develop preeclampsia after giving birth (postpartum preeclampsia). This usually occurs within 48 hours after childbirth but may occur up to 6 weeks after giving birth. What are the causes? The cause of preeclampsia is not known. What increases the risk? The following risk factors make you more likely to develop preeclampsia:  Being pregnant for the first time.  Having had preeclampsia during a past pregnancy.  Having a family history of preeclampsia.  Having high blood pressure.  Being pregnant with more than one baby.  Being 35 or older.  Being African-American.  Having kidney disease or diabetes.  Having medical conditions such as lupus or blood diseases.  Being very overweight (obese). What are the signs or symptoms? The most common symptoms are:  Severe headaches.  Vision problems, such as blurred or double vision.  Abdominal pain, especially upper abdominal pain. Other symptoms that may develop as the condition gets worse include:  Sudden weight gain.  Sudden swelling of the hands, face, legs, and feet.  Severe nausea  and vomiting.  Numbness in the face, arms, legs, and feet.  Dizziness.  Urinating less than usual.  Slurred speech.  Convulsions or seizures. How is this diagnosed? There are no screening tests for preeclampsia. Your health care provider will ask you about symptoms and check for signs of preeclampsia during your prenatal visits. You may also have tests that include:  Checking your blood pressure.  Urine tests to check for protein. Your health care provider will check for this at every prenatal visit.  Blood tests.  Monitoring your baby's heart rate.  Ultrasound. How is this treated? You and your health care provider will determine the treatment approach that is best for you. Treatment may include:  Having more frequent prenatal exams to check for signs of preeclampsia, if you have an increased risk for preeclampsia.  Medicine to lower your blood pressure.  Staying in the hospital, if your condition is severe. There, treatment will focus on controlling your blood pressure and the amount of fluids in your body (fluid retention).  Taking medicine (magnesium sulfate) to prevent seizures. This may be given as an injection or through an IV.  Taking a low-dose aspirin during your pregnancy.  Delivering your baby early. You may have your labor started with medicine (induced), or you may have a cesarean delivery. Follow these instructions at home: Eating and drinking   Drink enough fluid to keep your urine pale yellow.  Avoid caffeine. Lifestyle  Do not use any products that contain nicotine or tobacco, such as cigarettes and e-cigarettes. If you need help quitting, ask your health care provider.  Do not use alcohol or drugs.  Avoid stress as much as possible. Rest and get   plenty of sleep. General instructions  Take over-the-counter and prescription medicines only as told by your health care provider.  When lying down, lie on your left side. This keeps pressure off your  major blood vessels.  When sitting or lying down, raise (elevate) your feet. Try putting some pillows underneath your lower legs.  Exercise regularly. Ask your health care provider what kinds of exercise are best for you.  Keep all follow-up and prenatal visits as told by your health care provider. This is important. How is this prevented? There is no known way of preventing preeclampsia or eclampsia from developing. However, to lower your risk of complications and detect problems early:  Get regular prenatal care. Your health care provider may be able to diagnose and treat the condition early.  Maintain a healthy weight. Ask your health care provider for help managing weight gain during pregnancy.  Work with your health care provider to manage any long-term (chronic) health conditions you have, such as diabetes or kidney problems.  You may have tests of your blood pressure and kidney function after giving birth.  Your health care provider may have you take low-dose aspirin during your next pregnancy. Contact a health care provider if:  You have symptoms that your health care provider told you may require more treatment or monitoring, such as: ? Headaches. ? Nausea or vomiting. ? Abdominal pain. ? Dizziness. ? Light-headedness. Get help right away if:  You have severe: ? Abdominal pain. ? Headaches that do not get better. ? Dizziness. ? Vision problems. ? Confusion. ? Nausea or vomiting.  You have any of the following: ? A seizure. ? Sudden, rapid weight gain. ? Sudden swelling in your hands, ankles, or face. ? Trouble moving any part of your body. ? Numbness in any part of your body. ? Trouble speaking. ? Abnormal bleeding.  You faint. Summary  Preeclampsia is a serious condition that may develop during pregnancy.  This condition causes high blood pressure and increased protein in your urine along with other symptoms, such as headaches and vision  changes.  Diagnosing and treating preeclampsia early is very important. If not treated early, it can cause serious problems for you and your baby.  Get help right away if you have symptoms that your health care provider told you to watch for. This information is not intended to replace advice given to you by your health care provider. Make sure you discuss any questions you have with your health care provider. Document Revised: 06/10/2018 Document Reviewed: 05/14/2016 Elsevier Patient Education  2020 Elsevier Inc.  

## 2020-10-29 NOTE — MAU Provider Note (Signed)
History     CSN: [id number]  Arrival date and time: 10/28/20 2342   Event Date/Time   First Provider Initiated Contact with Patient 10/29/20 0135      No chief complaint on file.  Charlotte Nelson is a 40 y.o. [id number] at [redacted]w[redacted]d who receives care at [facility].  She presents today for Headache with known PreEclampsia.  She states she she was seen in the office on Friday by Dr. Ronita Hipps and was informed of Preeclampsia diagnosis.  She also reported that she had a headache at that time and was instructed to go home and take Tylenol.  Patient states she took Tylenol with no improvement in her symptoms.  Patient rates her pain 7/10. Patient reports pain started at top of head and "was pretty equal on both sides."  Patient further reports "it was like constant pressure... like my head was in a vice."  Patient questions if headache was result of taking Procardia which she was started on yesterday.  Patient denies visual disturbances or SOB.  patient endorses fetal movement and denies abdominal cramping or contractions.  Patient also with gestational diabetes A2 on insulin.   OB History    Gravida  4   Para  3   Term  2   Preterm  1   AB  0   Living  3     SAB  0   IAB  0   Ectopic  0   Multiple  0   Live Births  3           Past Medical History:  Diagnosis Date  . Alopecia   . Anemia   . Anxiety   . Depression   . Family history of adverse reaction to anesthesia    PATERNAL GRANDFATHER HAD UNKNOWN PROBLEM  MANY YRS AGO - "IT WASN'T A SERIOUS PROBLEM"  . Gastritis   . GERD (gastroesophageal reflux disease)   . Gestational diabetes    on glyburide  . Gestational diabetes mellitus, class A2 07/07/2011   does not normally have diabetes  . Headache(784.0)    migraine  . Kidney stones   . OCD (obsessive compulsive disorder)   . Pregnancy induced hypertension   . Tremor    "familial tremor"    Past Surgical History:  Procedure Laterality Date  . CYSTOSCOPY W/  URETERAL STENT PLACEMENT Left 09/22/2015   Procedure: CYSTOSCOPY WITH LEFT  RETROGRADE PYELOGRAM/ STENT PLACEMENT;  Surgeon: Carolan Clines, MD;  Location: [facility];  Service: Urology;  Laterality: Left;  . LITHOTRIPSY    . MOUTH SURGERY     wisdom teeth and gum x2    Family History  Problem Relation Age of Onset  . Migraines Mother   . Thyroid disease Mother   . Hypertension Mother   . Miscarriages / [place] Mother   . Urolithiasis Father   . Hypertension Father   . Diabetes Father   . Nephrolithiasis Father   . Cancer Maternal Grandmother        lung ca  . COPD Maternal Grandmother   . Heart disease Maternal Grandmother   . Diabetes Maternal Grandmother   . Depression Maternal Grandmother   . Alcohol abuse Maternal Grandfather   . Heart disease Maternal Grandfather   . Stroke Maternal Grandfather   . Stroke Paternal Grandmother   . Diabetes Paternal Grandmother   . Heart disease Paternal Grandfather   . Diabetes Paternal Grandfather     Social History   Tobacco Use  .  Smoking status: Never Smoker  . Smokeless tobacco: Never Used  Substance Use Topics  . Alcohol use: Yes    Comment: occasional  . Drug use: No    Allergies:  Allergies  Allergen Reactions  . Citalopram Hydrobromide Itching    Pt states that this medication causes prolonged itching.....lasting for about a month.  . Cymbalta [Duloxetine Hcl]   . Viibryd [Vilazodone Hcl]     Tremor worsening.     Medications Prior to Admission  Medication Sig Dispense Refill Last Dose  . BioGaia Probiotic (BIOGAIA/GERBER SOOTHE) LIQD Take 5 drops by mouth daily at 8 pm.   10/28/2020 at Unknown time  . docusate sodium (COLACE) 100 MG capsule Take 100 mg by mouth 2 (two) times daily.   Past Week at Unknown time  . insulin glargine (LANTUS) 100 UNIT/ML injection Inject 18 Units into the skin at bedtime.   10/28/2020 at Unknown time  . metFORMIN (GLUCOPHAGE) 500 MG tablet Take 500 mg by mouth 2 (two) times daily  with a meal.   10/28/2020 at Unknown time  . NIFEdipine (PROCARDIA) 10 MG capsule Take 30 mg by mouth daily.   10/28/2020 at 0800  . Prenatal Vit-Fe Fumarate-FA (PRENATAL MULTIVITAMIN) TABS tablet Take 1 tablet by mouth daily at 12 noon.   10/28/2020 at Unknown time  . ALPRAZolam (XANAX) 0.5 MG tablet Take 1 tablet (0.5 mg total) by mouth daily as needed for anxiety. 10 tablet 0 More than a month at Unknown time  . azelastine (OPTIVAR) 0.05 % ophthalmic solution Place 2 drops into both eyes 2 (two) times daily. 6 mL 11 More than a month at Unknown time  . azithromycin (ZITHROMAX Z-PAK) 250 MG tablet Take 2 tablets (500 mg) on  Day 1,  followed by 1 tablet (250 mg) once daily on Days 2 through 5. 6 tablet 0 More than a month at Unknown time  . ipratropium (ATROVENT) 0.06 % nasal spray Place 2 sprays into both nostrils 4 (four) times daily. 15 mL 12 More than a month at Unknown time  . Lifitegrast 5 % SOLN Apply 1 drop to eye 2 (two) times daily.   More than a month at Unknown time  . pantoprazole (PROTONIX) 40 MG tablet TAKE ONE TABLET BY MOUTH EVERY NIGHT AT BEDTIME 30 tablet 1 More than a month at Unknown time  . primidone (MYSOLINE) 50 MG tablet Take 0.25 tablets (12.5 mg total) by mouth at bedtime. 30 tablet 3 More than a month at Unknown time  . TRINTELLIX 5 MG TABS tablet TAKE ONE TABLET BY MOUTH DAILY 30 tablet 0 More than a month at Unknown time    Review of Systems  Constitutional: Negative for chills and fever.  Respiratory: Negative for cough and shortness of breath.   Genitourinary: Negative for difficulty urinating, dysuria, vaginal bleeding and vaginal discharge.  Neurological: Positive for headaches. Negative for dizziness.   Physical Exam   Blood pressure (!) 143/93, pulse 92, temperature 98.2 F (36.8 C), temperature source Oral, resp. rate 20, height 5\' 4"  (1.626 m), weight 100 kg, SpO2 97 %, unknown if currently breastfeeding. Vitals:   10/29/20 0100 10/29/20 0105 10/29/20 0110  10/29/20 0115  BP: (!) 146/95   (!) 143/93  Pulse: 96   92  Resp:      Temp:      TempSrc:      SpO2: 98% 98% 97% 97%  Weight:      Height:        Physical  Exam Constitutional:      Appearance: She is well-developed.  HENT:     Head: Normocephalic and atraumatic.  Eyes:     Conjunctiva/sclera: Conjunctivae normal.  Cardiovascular:     Rate and Rhythm: Normal rate and regular rhythm.     Heart sounds: Normal heart sounds.  Pulmonary:     Effort: Pulmonary effort is normal.     Breath sounds: Normal breath sounds.  Abdominal:     General: Bowel sounds are normal.     Palpations: Abdomen is soft.  Musculoskeletal:     Cervical back: Normal range of motion.  Skin:    General: Skin is warm and dry.  Neurological:     Mental Status: She is alert and oriented to person, place, and time.  Psychiatric:        Mood and Affect: Mood normal.        Behavior: Behavior normal.     Fetal Assessment 145 bpm, Mod Var, -Decels, +Accels Toco: Irregular  MAU Course   Results for orders placed or performed during the hospital encounter of 10/28/20 (from the past 24 hour(s))  CBC     Status: Abnormal   Collection Time: 10/29/20  1:19 AM  Result Value Ref Range   WBC 10.3 4.0 - 10.5 K/uL   RBC 5.05 3.87 - 5.11 MIL/uL   Hemoglobin 12.8 12.0 - 15.0 g/dL   HCT 40.0 36.0 - 46.0 %   MCV 79.2 (L) 80.0 - 100.0 fL   MCH 25.3 (L) 26.0 - 34.0 pg   MCHC 32.0 30.0 - 36.0 g/dL   RDW 15.2 11.5 - 15.5 %   Platelets 174 150 - 400 K/uL   nRBC 0.0 0.0 - 0.2 %  Comprehensive metabolic panel     Status: Abnormal   Collection Time: 10/29/20  1:19 AM  Result Value Ref Range   Sodium 138 135 - 145 mmol/L   Potassium 4.0 3.5 - 5.1 mmol/L   Chloride 108 98 - 111 mmol/L   CO2 18 (L) 22 - 32 mmol/L   Glucose, Bld 142 (H) 70 - 99 mg/dL   BUN 17 6 - 20 mg/dL   Creatinine, Ser 0.96 0.44 - 1.00 mg/dL   Calcium 9.4 8.9 - 10.3 mg/dL   Total Protein 5.9 (L) 6.5 - 8.1 g/dL   Albumin 2.6 (L) 3.5 - 5.0  g/dL   AST 22 15 - 41 U/L   ALT 28 0 - 44 U/L   Alkaline Phosphatase 96 38 - 126 U/L   Total Bilirubin 0.5 0.3 - 1.2 mg/dL   GFR, Estimated >60 >60 mL/min   Anion gap 12 5 - 15  Protein / creatinine ratio, urine     Status: Abnormal   Collection Time: 10/29/20  1:19 AM  Result Value Ref Range   Creatinine, Urine 119.50 mg/dL   Total Protein, Urine 78 mg/dL   Protein Creatinine Ratio 0.65 (H) 0.00 - 0.15 mg/mg[Cre]  Urinalysis, Routine w reflex microscopic Urine, Clean Catch     Status: Abnormal   Collection Time: 10/29/20  1:26 AM  Result Value Ref Range   Color, Urine YELLOW YELLOW   APPearance HAZY (A) CLEAR   Specific Gravity, Urine 1.019 1.005 - 1.030   pH 6.0 5.0 - 8.0   Glucose, UA NEGATIVE NEGATIVE mg/dL   Hgb urine dipstick NEGATIVE NEGATIVE   Bilirubin Urine NEGATIVE NEGATIVE   Ketones, ur NEGATIVE NEGATIVE mg/dL   Protein, ur 100 (A) NEGATIVE mg/dL   Nitrite NEGATIVE  NEGATIVE   Leukocytes,Ua NEGATIVE NEGATIVE   RBC / HPF 21-50 0 - 5 RBC/hpf   WBC, UA 0-5 0 - 5 WBC/hpf   Bacteria, UA RARE (A) NONE SEEN   Squamous Epithelial / LPF 0-5 0 - 5   Mucus PRESENT    Ca Oxalate Crys, UA PRESENT    No results found.  MDM Physical Exam Labs: CBC, CMP, PC Ratio Measure BPQ15 min EFM Pain Management PPI Consult Assessment and Plan  40 year old [id number]  SIUP at 34.3weeks Cat I FT Headache PreEclampsia  -POC Reviewed. -Exam performed and findings discussed. -NST Reactive -Labs ordered and collected -Will give Fioricet for HA. -Will monitor and await results.   Maryann Conners MSN, CNM 10/29/2020, 1:35 AM   Reassessment (3:50 AM) Vitals:   10/29/20 0246 10/29/20 0301 10/29/20 0316 10/29/20 0331  BP: 120/70 125/68 (!) 142/92 (!) 146/93  Pulse: 91 (!) 103 95 84  Resp:      Temp:      TempSrc:      SpO2: 96%   99%  Weight:      Height:        -Dr. Eddie North consulted and informed of patient's status and results.  Discussed discharge versus further  management and advised: *Okay to discharge to home with follow-up, as scheduled, by primary ob. -Patient updated on plan of care and given precautions. -We will give limited prescription for Fioricet. -Reviewed usage in setting of known PreEclampsia.  Informed that she should report any continued HA despite treatment.  -Discussed BMZ dosing today and tomorrow and patient states that she was informed of this by her primary ob on Friday and is scheduled to get this on Monday and Tuesday.  Patient declines dosing today. -Encouraged to return to MAU for any worsening of symptoms or onset of new symptoms. -Discharged to home in stable condition.  Maryann Conners MSN, CNM Advanced Practice Provider, [facility]

## 2020-10-29 NOTE — MAU Note (Signed)
PT SAYS WED SHE WAS DX WITH PRE- STARTED TAKING BP MEDS Friday MORN.    STARTED H/A AT 314PM- ON Friday- Bradley Ronita Hipps . TOOK 2XS TYLENOL-  515PM ON Friday.      H/A IS NOW WORSE. Marland Kitchen CALLED OFFICE TONIGHT AT 6701.    AT HOME BP- 156/90 ? Marland Kitchen   MAYBE DEL  NEXT WEEK .

## 2020-11-02 ENCOUNTER — Inpatient Hospital Stay (HOSPITAL_COMMUNITY)
Admission: AD | Admit: 2020-11-02 | Discharge: 2020-11-02 | Disposition: A | Discharge status: Home / Self Care | Payer: 59 | Attending: Obstetrics and Gynecology | Admitting: Obstetrics and Gynecology

## 2020-11-02 ENCOUNTER — Encounter (HOSPITAL_COMMUNITY): Payer: Self-pay | Admitting: Obstetrics and Gynecology

## 2020-11-02 DIAGNOSIS — O133 Gestational [pregnancy-induced] hypertension without significant proteinuria, third trimester: Secondary | ICD-10-CM

## 2020-11-02 DIAGNOSIS — O1493 Unspecified pre-eclampsia, third trimester: Secondary | ICD-10-CM | POA: Diagnosis not present

## 2020-11-02 DIAGNOSIS — Z3A35 35 weeks gestation of pregnancy: Secondary | ICD-10-CM

## 2020-11-02 DIAGNOSIS — Z3689 Encounter for other specified antenatal screening: Secondary | ICD-10-CM

## 2020-11-02 DIAGNOSIS — L299 Pruritus, unspecified: Secondary | ICD-10-CM

## 2020-11-02 LAB — CBC
HCT: 38.9 % (ref 36.0–46.0)
Hemoglobin: 12.4 g/dL (ref 12.0–15.0)
MCH: 25 pg — ABNORMAL LOW (ref 26.0–34.0)
MCHC: 31.9 g/dL (ref 30.0–36.0)
MCV: 78.4 fL — ABNORMAL LOW (ref 80.0–100.0)
Platelets: 192 10*3/uL (ref 150–400)
RBC: 4.96 MIL/uL (ref 3.87–5.11)
RDW: 15.3 % (ref 11.5–15.5)
WBC: 10.9 10*3/uL — ABNORMAL HIGH (ref 4.0–10.5)
nRBC: 0 % (ref 0.0–0.2)

## 2020-11-02 LAB — COMPREHENSIVE METABOLIC PANEL
ALT: 24 U/L (ref 0–44)
AST: 20 U/L (ref 15–41)
Albumin: 2.6 g/dL — ABNORMAL LOW (ref 3.5–5.0)
Alkaline Phosphatase: 92 U/L (ref 38–126)
Anion gap: 11 (ref 5–15)
BUN: 15 mg/dL (ref 6–20)
CO2: 19 mmol/L — ABNORMAL LOW (ref 22–32)
Calcium: 9.1 mg/dL (ref 8.9–10.3)
Chloride: 107 mmol/L (ref 98–111)
Creatinine, Ser: 0.81 mg/dL (ref 0.44–1.00)
GFR, Estimated: >60 mL/min (ref >60)
Glucose, Bld: 98 mg/dL (ref 70–99)
Potassium: 3.9 mmol/L (ref 3.5–5.1)
Sodium: 137 mmol/L (ref 135–145)
Total Bilirubin: 0.3 mg/dL (ref 0.3–1.2)
Total Protein: 5.9 g/dL — ABNORMAL LOW (ref 6.5–8.1)

## 2020-11-02 LAB — URINALYSIS, ROUTINE W REFLEX MICROSCOPIC
Bilirubin Urine: NEGATIVE
Glucose, UA: NEGATIVE mg/dL
Ketones, ur: NEGATIVE mg/dL
Leukocytes,Ua: NEGATIVE
Nitrite: NEGATIVE
Protein, ur: 100 mg/dL — AB
Specific Gravity, Urine: 1.013 (ref 1.005–1.030)
pH: 6 (ref 5.0–8.0)

## 2020-11-02 LAB — PROTEIN / CREATININE RATIO, URINE
Creatinine, Urine: 78.1 mg/dL
Protein Creatinine Ratio: 1.7 mg/mg{Cre} — ABNORMAL HIGH (ref 0.00–0.15)
Total Protein, Urine: 133 mg/dL

## 2020-11-02 NOTE — MAU Note (Signed)
B/P was 174 98 tonight when resting. Increased my b/p meds end of last wk but b/p does not seem to be responding. Dizzy but have not eaten since 1600. A few hours ago I started itching all over which is new. My chest feels "heavy". Denies LOF and VB. Had Steroid shots on Monday and Tues this wk. Slight h/a tonight

## 2020-11-02 NOTE — Discharge Instructions (Signed)
Preeclampsia and Eclampsia Preeclampsia is a serious condition that may develop during pregnancy. This condition involves high blood pressure during pregnancy and causes symptoms such as headaches, vision changes, and increased swelling in the legs, hands, and face. Preeclampsia occurs after 20 weeks of pregnancy. Eclampsia is a seizure that happens from worsening preeclampsia. Diagnosing and managing preeclampsia early is important. If not treated early, it can cause serious problems for mother and baby. There is no cure for this condition. However, during pregnancy, delivering the baby may be the best treatment for preeclampsia or eclampsia. For most women, symptoms of preeclampsia and eclampsia go away after giving birth. In rare cases, a woman may develop preeclampsia or eclampsia after giving birth. This usually occurs within 48 hours after childbirth but may occur up to 6 weeks after giving birth. What are the causes? The cause of this condition is not known. What increases the risk? The following factors make you more likely to develop preeclampsia:  Being pregnant for the first time or being pregnant with multiples.  Having had preeclampsia or a condition called hemolysis, elevated liver enzymes, and low platelet count (HELLP)syndrome during a past pregnancy.  Having a family history of preeclampsia.  Being older than age 35.  Being obese.  Becoming pregnant through fertility treatments. Conditions that reduce blood flow or oxygen to your placenta and baby may also increase your risk. These include:  High blood pressure before, during, or immediately following pregnancy.  Kidney disease.  Diabetes.  Blood clotting disorders.  Autoimmune diseases, such as lupus.  Sleep apnea. What are the signs or symptoms? Common symptoms of this condition include:  A severe, throbbing headache that does not go away.  Vision problems, such as blurred or double vision and light  sensitivity.  Pain in the stomach, especially the right upper region.  Pain in the shoulder. Other symptoms that may develop as the condition gets worse include:  Sudden weight gain because of fluid buildup in the body. This causes swelling of the face, hands, legs, and feet.  Severe nausea and vomiting.  Urinating less than usual.  Shortness of breath.  Seizures. How is this diagnosed? Your health care provider will ask you about symptoms and check for signs of preeclampsia during your prenatal visits. You will also have routine tests, including:  Checking your blood pressure.  Urine tests to check for protein.  Blood tests to assess your organ function.  Monitoring your baby's heart rate.  Ultrasounds to check fetal growth.   How is this treated? You and your health care provider will determine the treatment that is best for you. Treatment may include:  Frequent prenatal visits to check for preeclampsia.  Medicine to lower your blood pressure.  Medicine to prevent seizures.  Low-dose aspirin during your pregnancy.  Staying in the hospital, in severe cases. You will be given medicines to control your blood pressure and the amount of fluids in your body.  Delivering your baby. Work with your health care provider to manage any chronic health conditions, such as diabetes or kidney problems. Also, work with your health care provider to manage weight gain during pregnancy. Follow these instructions at home: Eating and drinking  Drink enough fluid to keep your urine pale yellow.  Avoid caffeine. Caffeine may increase blood pressure and heart rate and lead to dehydration.  Reduce the amount of salt that you eat. Lifestyle  Do not use any products that contain nicotine or tobacco. These products include cigarettes, chewing tobacco, and   vaping devices, such as e-cigarettes. If you need help quitting, ask your health care provider.  Do not use alcohol or drugs.  Avoid  stress as much as possible.  Rest and get plenty of sleep. General instructions  Take over-the-counter and prescription medicines only as told by your health care provider.  When lying down, lie on your left side. This keeps pressure off your major blood vessels.  When sitting or lying down, raise (elevate) your feet. Try putting pillows underneath your lower legs.  Exercise regularly. Ask your health care provider what kinds of exercise are best for you.  Check your blood pressure as often as recommended by your health care provider.  Keep all prenatal and follow-up visits. This is important.   Contact a health care provider if:  You have symptoms that may need treatment or closer monitoring. These include: ? Headaches. ? Stomach pain or nausea and vomiting. ? Shoulder pain. ? Vision problems, such as spots in front of your eyes or blurry vision. ? Sudden weight gain or increased swelling in your face, hands, legs, and feet. ? Increased anxiety or feeling of impending doom. ? Signs or symptoms of labor. Get help right away if:  You have any of the following symptoms: ? A seizure. ? Shortness of breath or trouble breathing. ? Trouble speaking or slurred speech. ? Fainting. ? Chest pain. These symptoms may represent a serious problem that is an emergency. Do not wait to see if the symptoms will go away. Get medical help right away. Call your local emergency services (911 in the [place] Do not drive yourself to the hospital. Summary  Preeclampsia is a serious condition that may develop during pregnancy.  Diagnosing and treating preeclampsia early is very important.  Keep all prenatal and follow-up visits. This is important.  Get help right away if you have a seizure, shortness of breath or trouble breathing, trouble speaking or slurred speech, chest pain, or fainting. This information is not intended to replace advice given to you by your health care provider. Make sure you  discuss any questions you have with your health care provider. Document Revised: 06/30/2020 Document Reviewed: 06/30/2020 Elsevier Patient Education  2021 [employer].  

## 2020-11-02 NOTE — MAU Provider Note (Signed)
Chief Complaint:  Hypertension   Event Date/Time   First Provider Initiated Contact with Patient 11/02/20 2110     HPI: Charlotte Nelson is a 40 y.o. [id number] at 64w0dwho presents to maternity admissions reporting elevated blood pressure reading at home. Has some dizziness but thinks it is due to not eating. Itching all over.  States chest felt heavy only when she got into bed here, as soon as she was in bed it resolved. . She reports good fetal movement, denies LOF, vaginal bleeding, vaginal itching/burning, urinary symptoms, h/a, dizziness, n/v, diarrhea, constipation or fever/chills.  She denies headache, visual changes or RUQ abdominal pain.  Hypertension This is a recurrent problem. The problem has been waxing and waning since onset. Associated symptoms include anxiety and peripheral edema. Pertinent negatives include no blurred vision, headaches, malaise/fatigue or shortness of breath. There are no associated agents to hypertension. There are no compliance problems.     RN Note: B/P was 174 98 tonight when resting. Increased my b/p meds end of last wk but b/p does not seem to be responding. Dizzy but have not eaten since 1600. A few hours ago I started itching all over which is new. My chest feels "heavy". Denies LOF and VB. Had Steroid shots on Monday and Tues this wk. Slight h/a tonight  Past Medical History: Past Medical History:  Diagnosis Date  . Alopecia   . Anemia   . Anxiety   . Depression   . Family history of adverse reaction to anesthesia    PATERNAL GRANDFATHER HAD UNKNOWN PROBLEM  MANY YRS AGO - "IT WASN'T A SERIOUS PROBLEM"  . Gastritis   . GERD (gastroesophageal reflux disease)   . Gestational diabetes    on glyburide  . Gestational diabetes mellitus, class A2 07/07/2011   does not normally have diabetes  . Headache(784.0)    migraine  . Kidney stones   . OCD (obsessive compulsive disorder)   . Pregnancy induced hypertension   . Tremor    "familial tremor"     Past obstetric history: OB History  Gravida Para Term Preterm AB Living  4 3 2 1  0 3  SAB IAB Ectopic Multiple Live Births  0 0 0 0 3    # Outcome Date GA Lbr Len/2nd Weight Sex Delivery Anes PTL Lv  4 Current           3 Term 07/06/11 [redacted]w[redacted]d 05:30 / 00:09 3581 g F Vag-Spont EPI       Birth Comments: WNL  2 Term 2009 [redacted]w[redacted]d  3742 g M Vag-Spont EPI  LIV  1 Preterm 2005 [redacted]w[redacted]d  3544 g M Vag-Spont EPI  LIV    Past Surgical History: Past Surgical History:  Procedure Laterality Date  . CYSTOSCOPY W/ URETERAL STENT PLACEMENT Left 09/22/2015   Procedure: CYSTOSCOPY WITH LEFT  RETROGRADE PYELOGRAM/ STENT PLACEMENT;  Surgeon: Carolan Clines, MD;  Location: [facility];  Service: Urology;  Laterality: Left;  . LITHOTRIPSY    . MOUTH SURGERY     wisdom teeth and gum x2    Family History: Family History  Problem Relation Age of Onset  . Migraines Mother   . Thyroid disease Mother   . Hypertension Mother   . Miscarriages / [place] Mother   . Urolithiasis Father   . Hypertension Father   . Diabetes Father   . Nephrolithiasis Father   . Cancer Maternal Grandmother        lung ca  . COPD Maternal Grandmother   .  Heart disease Maternal Grandmother   . Diabetes Maternal Grandmother   . Depression Maternal Grandmother   . Alcohol abuse Maternal Grandfather   . Heart disease Maternal Grandfather   . Stroke Maternal Grandfather   . Stroke Paternal Grandmother   . Diabetes Paternal Grandmother   . Heart disease Paternal Grandfather   . Diabetes Paternal Grandfather     Social History: Social History   Tobacco Use  . Smoking status: Never Smoker  . Smokeless tobacco: Never Used  Vaping Use  . Vaping Use: Never used  Substance Use Topics  . Alcohol use: Not Currently    Comment: occasional  . Drug use: No    Allergies:  Allergies  Allergen Reactions  . Citalopram Hydrobromide Itching    Pt states that this medication causes prolonged itching.....lasting for about a  month.  . Cymbalta [Duloxetine Hcl]   . Viibryd [Vilazodone Hcl]     Tremor worsening.     Meds:  Medications Prior to Admission  Medication Sig Dispense Refill Last Dose  . butalbital-acetaminophen-caffeine (FIORICET) 50-325-40 MG tablet Take 1-2 tablets by mouth every 6 (six) hours as needed for headache. 10 tablet 0 Past Week at Unknown time  . docusate sodium (COLACE) 100 MG capsule Take 100 mg by mouth 2 (two) times daily.   11/01/2020 at Unknown time  . insulin glargine (LANTUS) 100 UNIT/ML injection Inject 18 Units into the skin at bedtime.   11/01/2020 at Unknown time  . metFORMIN (GLUCOPHAGE) 500 MG tablet Take 500 mg by mouth 2 (two) times daily with a meal.   11/02/2020 at Unknown time  . NIFEdipine (PROCARDIA) 10 MG capsule Take 30 mg by mouth daily.   11/02/2020 at Unknown time  . Prenatal Vit-Fe Fumarate-FA (PRENATAL MULTIVITAMIN) TABS tablet Take 1 tablet by mouth daily at 12 noon.   11/01/2020 at Unknown time  . azelastine (OPTIVAR) 0.05 % ophthalmic solution Place 2 drops into both eyes 2 (two) times daily. 6 mL 11   . BioGaia Probiotic (BIOGAIA/GERBER SOOTHE) LIQD Take 5 drops by mouth daily at 8 pm.     . ipratropium (ATROVENT) 0.06 % nasal spray Place 2 sprays into both nostrils 4 (four) times daily. 15 mL 12   . pantoprazole (PROTONIX) 40 MG tablet TAKE ONE TABLET BY MOUTH EVERY NIGHT AT BEDTIME 30 tablet 1 More than a month at Unknown time    I have reviewed patient's Past Medical Hx, Surgical Hx, Family Hx, Social Hx, medications and allergies.   ROS:  Review of Systems  Constitutional: Negative for malaise/fatigue.  Eyes: Negative for blurred vision.  Respiratory: Negative for shortness of breath.   Neurological: Negative for headaches.   Other systems negative  Physical Exam   Patient Vitals for the past 24 hrs:  BP Temp Pulse Resp SpO2 Height Weight  11/02/20 2246 137/87 - 94 - - - -  11/02/20 2231 (!) 146/87 - 84 - - - -  11/02/20 2215 (!) 137/93 - 93 - 98  % - -  11/02/20 2201 (!) 150/93 - 91 - - - -  11/02/20 2146 (!) 147/98 - 96 - - - -  11/02/20 2131 (!) 150/95 - 90 - - - -  11/02/20 2116 (!) 138/92 - 97 - - - -  11/02/20 2100 (!) 153/91 - 90 - 98 % - -  11/02/20 2041 (!) 155/105 98.8 F (37.1 C) 95 18 99 % - -  11/02/20 2031 - - - - - 5\' 4"  (1.626 m)  99.8 kg   Constitutional: Well-developed, well-nourished female in no acute distress.  Cardiovascular: normal rate and rhythm Respiratory: normal effort, clear to auscultation bilaterally GI: Abd soft, non-tender, gravid appropriate for gestational age.   No rebound or guarding. MS: Extremities nontender, no edema, normal ROM Neurologic: Alert and oriented x 4. DTR 3+ GU: Neg CVAT.  PELVIC EXAM:deferred  FHT:  Baseline 140 , moderate variability, accelerations present, no decelerations Contractions: Irregular     Labs: Results for orders placed or performed during the hospital encounter of 11/02/20 (from the past 24 hour(s))  Urinalysis, Routine w reflex microscopic     Status: Abnormal   Collection Time: 11/02/20  9:00 PM  Result Value Ref Range   Color, Urine YELLOW YELLOW   APPearance CLEAR CLEAR   Specific Gravity, Urine 1.013 1.005 - 1.030   pH 6.0 5.0 - 8.0   Glucose, UA NEGATIVE NEGATIVE mg/dL   Hgb urine dipstick MODERATE (A) NEGATIVE   Bilirubin Urine NEGATIVE NEGATIVE   Ketones, ur NEGATIVE NEGATIVE mg/dL   Protein, ur 100 (A) NEGATIVE mg/dL   Nitrite NEGATIVE NEGATIVE   Leukocytes,Ua NEGATIVE NEGATIVE   RBC / HPF 21-50 0 - 5 RBC/hpf   WBC, UA 0-5 0 - 5 WBC/hpf   Bacteria, UA RARE (A) NONE SEEN   Squamous Epithelial / LPF 0-5 0 - 5   Mucus PRESENT   Protein / creatinine ratio, urine     Status: Abnormal   Collection Time: 11/02/20  9:00 PM  Result Value Ref Range   Creatinine, Urine 78.10 mg/dL   Total Protein, Urine 133 mg/dL   Protein Creatinine Ratio 1.70 (H) 0.00 - 0.15 mg/mg[Cre]  CBC     Status: Abnormal   Collection Time: 11/02/20  9:34 PM  Result  Value Ref Range   WBC 10.9 (H) 4.0 - 10.5 K/uL   RBC 4.96 3.87 - 5.11 MIL/uL   Hemoglobin 12.4 12.0 - 15.0 g/dL   HCT 38.9 36.0 - 46.0 %   MCV 78.4 (L) 80.0 - 100.0 fL   MCH 25.0 (L) 26.0 - 34.0 pg   MCHC 31.9 30.0 - 36.0 g/dL   RDW 15.3 11.5 - 15.5 %   Platelets 192 150 - 400 K/uL   nRBC 0.0 0.0 - 0.2 %  Comprehensive metabolic panel     Status: Abnormal   Collection Time: 11/02/20  9:34 PM  Result Value Ref Range   Sodium 137 135 - 145 mmol/L   Potassium 3.9 3.5 - 5.1 mmol/L   Chloride 107 98 - 111 mmol/L   CO2 19 (L) 22 - 32 mmol/L   Glucose, Bld 98 70 - 99 mg/dL   BUN 15 6 - 20 mg/dL   Creatinine, Ser 0.81 0.44 - 1.00 mg/dL   Calcium 9.1 8.9 - 10.3 mg/dL   Total Protein 5.9 (L) 6.5 - 8.1 g/dL   Albumin 2.6 (L) 3.5 - 5.0 g/dL   AST 20 15 - 41 U/L   ALT 24 0 - 44 U/L   Alkaline Phosphatase 92 38 - 126 U/L   Total Bilirubin 0.3 0.3 - 1.2 mg/dL   GFR, Estimated >60 >60 mL/min   Anion gap 11 5 - 15      Imaging:  No results found.  MAU Course/MDM: I have ordered labs and reviewed results. Labs stable, though Pr/Cr is more elevated NST reviewed, reassuring Consult Dr Ronita Hipps with presentation, exam findings and test results.  He states pt has an appt tomorrow. He will continue  to monitor her condition Treatments in MAU included EFM.    Assessment: Single IUP at [redacted]w[redacted]d Gestational Hypertension/Preeclampsia , stable  Plan: Discharge home Preeclampsia precautions. Labor precautions and fetal kick counts Follow up in Office for prenatal visits   Encouraged to return if she develops worsening of symptoms, increase in pain, fever, or other concerning symptoms.   Pt stable at time of discharge.  Hansel Feinstein CNM, MSN Certified Nurse-Midwife 11/02/2020 11:30 PM

## 2020-11-09 ENCOUNTER — Encounter (HOSPITAL_COMMUNITY): Payer: Self-pay | Admitting: *Deleted

## 2020-11-09 ENCOUNTER — Telehealth (HOSPITAL_COMMUNITY): Payer: Self-pay | Admitting: *Deleted

## 2020-11-09 NOTE — Telephone Encounter (Signed)
Preadmission screen  

## 2020-11-11 ENCOUNTER — Other Ambulatory Visit: Payer: Self-pay | Admitting: Obstetrics and Gynecology

## 2020-11-14 ENCOUNTER — Other Ambulatory Visit (HOSPITAL_COMMUNITY)
Admission: RE | Admit: 2020-11-14 | Discharge: 2020-11-14 | Disposition: A | Discharge status: Home / Self Care | Payer: 59 | Source: Ambulatory Visit | Attending: Obstetrics and Gynecology | Admitting: Obstetrics and Gynecology

## 2020-11-14 DIAGNOSIS — Z01812 Encounter for preprocedural laboratory examination: Secondary | ICD-10-CM | POA: Insufficient documentation

## 2020-11-14 DIAGNOSIS — Z20822 Contact with and (suspected) exposure to covid-19: Secondary | ICD-10-CM | POA: Insufficient documentation

## 2020-11-14 LAB — SARS CORONAVIRUS 2 (TAT 6-24 HRS): SARS Coronavirus 2: NEGATIVE

## 2020-11-16 ENCOUNTER — Encounter (HOSPITAL_COMMUNITY): Payer: Self-pay | Admitting: Obstetrics and Gynecology

## 2020-11-16 ENCOUNTER — Inpatient Hospital Stay (HOSPITAL_COMMUNITY): Payer: 59 | Admitting: Anesthesiology

## 2020-11-16 ENCOUNTER — Inpatient Hospital Stay (HOSPITAL_COMMUNITY)
Admission: AD | Admit: 2020-11-16 | Discharge: 2020-11-19 | DRG: 807 | Disposition: A | Discharge status: Home / Self Care | Payer: 59 | Attending: Obstetrics and Gynecology | Admitting: Obstetrics and Gynecology

## 2020-11-16 ENCOUNTER — Inpatient Hospital Stay (HOSPITAL_COMMUNITY): Payer: 59

## 2020-11-16 DIAGNOSIS — O24414 Gestational diabetes mellitus in pregnancy, insulin controlled: Secondary | ICD-10-CM

## 2020-11-16 DIAGNOSIS — Z3A37 37 weeks gestation of pregnancy: Secondary | ICD-10-CM

## 2020-11-16 DIAGNOSIS — O1404 Mild to moderate pre-eclampsia, complicating childbirth: Principal | ICD-10-CM | POA: Diagnosis present

## 2020-11-16 DIAGNOSIS — Z6791 Unspecified blood type, Rh negative: Secondary | ICD-10-CM

## 2020-11-16 DIAGNOSIS — O24424 Gestational diabetes mellitus in childbirth, insulin controlled: Secondary | ICD-10-CM | POA: Diagnosis present

## 2020-11-16 DIAGNOSIS — Z20822 Contact with and (suspected) exposure to covid-19: Secondary | ICD-10-CM | POA: Diagnosis present

## 2020-11-16 DIAGNOSIS — Z3490 Encounter for supervision of normal pregnancy, unspecified, unspecified trimester: Secondary | ICD-10-CM | POA: Diagnosis present

## 2020-11-16 DIAGNOSIS — O26893 Other specified pregnancy related conditions, third trimester: Secondary | ICD-10-CM | POA: Diagnosis present

## 2020-11-16 DIAGNOSIS — O1494 Unspecified pre-eclampsia, complicating childbirth: Secondary | ICD-10-CM

## 2020-11-16 LAB — CBC
HCT: 41.1 % (ref 36.0–46.0)
Hemoglobin: 13.1 g/dL (ref 12.0–15.0)
MCH: 25.1 pg — ABNORMAL LOW (ref 26.0–34.0)
MCHC: 31.9 g/dL (ref 30.0–36.0)
MCV: 78.7 fL — ABNORMAL LOW (ref 80.0–100.0)
Platelets: 173 10*3/uL (ref 150–400)
RBC: 5.22 MIL/uL — ABNORMAL HIGH (ref 3.87–5.11)
RDW: 15.6 % — ABNORMAL HIGH (ref 11.5–15.5)
WBC: 10.5 10*3/uL (ref 4.0–10.5)
nRBC: 0 % (ref 0.0–0.2)

## 2020-11-16 LAB — COMPREHENSIVE METABOLIC PANEL WITH GFR
ALT: 25 U/L (ref 0–44)
AST: 26 U/L (ref 15–41)
Albumin: 2.5 g/dL — ABNORMAL LOW (ref 3.5–5.0)
Alkaline Phosphatase: 118 U/L (ref 38–126)
Anion gap: 11 (ref 5–15)
BUN: 20 mg/dL (ref 6–20)
CO2: 18 mmol/L — ABNORMAL LOW (ref 22–32)
Calcium: 9.4 mg/dL (ref 8.9–10.3)
Chloride: 108 mmol/L (ref 98–111)
Creatinine, Ser: 0.86 mg/dL (ref 0.44–1.00)
GFR, Estimated: >60 mL/min (ref >60)
Glucose, Bld: 91 mg/dL (ref 70–99)
Potassium: 3.8 mmol/L (ref 3.5–5.1)
Sodium: 137 mmol/L (ref 135–145)
Total Bilirubin: 0.7 mg/dL (ref 0.3–1.2)
Total Protein: 6.1 g/dL — ABNORMAL LOW (ref 6.5–8.1)

## 2020-11-16 LAB — TYPE AND SCREEN
ABO/RH(D): O NEG
Antibody Screen: NEGATIVE

## 2020-11-16 LAB — SYPHILIS: RPR W/REFLEX TO RPR TITER AND TREPONEMAL ANTIBODIES, TRADITIONAL SCREENING AND DIAGNOSIS ALGORITHM: RPR Ser Ql: NONREACTIVE

## 2020-11-16 LAB — GLUCOSE, CAPILLARY
Glucose-Capillary: 93 mg/dL (ref 70–99)
Glucose-Capillary: 93 mg/dL (ref 70–99)

## 2020-11-16 MED ORDER — DIPHENHYDRAMINE HCL 50 MG/ML IJ SOLN: 12.5000 mg | INTRAMUSCULAR | Status: DC | PRN

## 2020-11-16 MED ORDER — TERBUTALINE SULFATE 1 MG/ML IJ SOLN: 0.2500 mg | Freq: Once | INTRAMUSCULAR | Status: DC | PRN

## 2020-11-16 MED ORDER — TETANUS-DIPHTH-ACELL PERTUSSIS 5-2.5-18.5 LF-MCG/0.5 IM SUSY: 0.5000 mL | PREFILLED_SYRINGE | Freq: Once | INTRAMUSCULAR | Status: DC

## 2020-11-16 MED ORDER — DIPHENHYDRAMINE HCL 25 MG PO CAPS: 25.0000 mg | ORAL_CAPSULE | Freq: Four times a day (QID) | ORAL | Status: DC | PRN

## 2020-11-16 MED ORDER — PRENATAL MULTIVITAMIN CH
1.0000 | ORAL_TABLET | Freq: Every day | ORAL | Status: DC
  Administered 2020-11-17 – 2020-11-19 (×3): 1 via ORAL
  Filled 2020-11-16 (×3): qty 1

## 2020-11-16 MED ORDER — TRANEXAMIC ACID-NACL 1000-0.7 MG/100ML-% IV SOLN: 1000.0000 mg | Freq: Once | INTRAVENOUS | Status: AC

## 2020-11-16 MED ORDER — DIBUCAINE (PERIANAL) 1 % EX OINT: 1.0000 "application " | TOPICAL_OINTMENT | CUTANEOUS | Status: DC | PRN

## 2020-11-16 MED ORDER — IBUPROFEN 600 MG PO TABS
600.0000 mg | ORAL_TABLET | Freq: Four times a day (QID) | ORAL | Status: DC
  Administered 2020-11-16 – 2020-11-19 (×12): 600 mg via ORAL
  Filled 2020-11-16 (×12): qty 1

## 2020-11-16 MED ORDER — COCONUT OIL OIL
1.0000 "application " | TOPICAL_OIL | Status: DC | PRN
  Administered 2020-11-16: 1 via TOPICAL

## 2020-11-16 MED ORDER — SOD CITRATE-CITRIC ACID 500-334 MG/5ML PO SOLN: 30.0000 mL | ORAL | Status: DC | PRN

## 2020-11-16 MED ORDER — LIDOCAINE HCL (PF) 1 % IJ SOLN
INTRAMUSCULAR | Status: DC | PRN
  Administered 2020-11-16: 10 mL via EPIDURAL

## 2020-11-16 MED ORDER — WITCH HAZEL-GLYCERIN EX PADS: 1.0000 "application " | MEDICATED_PAD | CUTANEOUS | Status: DC | PRN

## 2020-11-16 MED ORDER — LACTATED RINGERS IV SOLN: INTRAVENOUS | Status: DC

## 2020-11-16 MED ORDER — OXYTOCIN BOLUS FROM INFUSION
333.0000 mL | Freq: Once | INTRAVENOUS | Status: AC
  Administered 2020-11-16: 333 mL via INTRAVENOUS

## 2020-11-16 MED ORDER — BUTALBITAL-APAP-CAFFEINE 50-325-40 MG PO TABS
1.0000 | ORAL_TABLET | Freq: Once | ORAL | Status: AC
  Administered 2020-11-16: 1 via ORAL
  Filled 2020-11-16: qty 1

## 2020-11-16 MED ORDER — OXYTOCIN-SODIUM CHLORIDE 30-0.9 UT/500ML-% IV SOLN
1.0000 m[IU]/min | INTRAVENOUS | Status: DC
  Administered 2020-11-16: 2 m[IU]/min via INTRAVENOUS
  Filled 2020-11-16: qty 500

## 2020-11-16 MED ORDER — SIMETHICONE 80 MG PO CHEW
80.0000 mg | CHEWABLE_TABLET | ORAL | Status: DC | PRN
  Administered 2020-11-18 – 2020-11-19 (×4): 80 mg via ORAL
  Filled 2020-11-16 (×4): qty 1

## 2020-11-16 MED ORDER — PHENYLEPHRINE 40 MCG/ML (10ML) SYRINGE FOR IV PUSH (FOR BLOOD PRESSURE SUPPORT): 80.0000 ug | PREFILLED_SYRINGE | INTRAVENOUS | Status: DC | PRN

## 2020-11-16 MED ORDER — ONDANSETRON HCL 4 MG PO TABS: 4.0000 mg | ORAL_TABLET | ORAL | Status: DC | PRN

## 2020-11-16 MED ORDER — EPHEDRINE 5 MG/ML INJ: 10.0000 mg | INTRAVENOUS | Status: DC | PRN

## 2020-11-16 MED ORDER — ONDANSETRON HCL 4 MG/2ML IJ SOLN: 4.0000 mg | INTRAMUSCULAR | Status: DC | PRN

## 2020-11-16 MED ORDER — ONDANSETRON HCL 4 MG/2ML IJ SOLN: 4.0000 mg | Freq: Four times a day (QID) | INTRAMUSCULAR | Status: DC | PRN

## 2020-11-16 MED ORDER — ACETAMINOPHEN 325 MG PO TABS
650.0000 mg | ORAL_TABLET | ORAL | Status: DC | PRN
  Administered 2020-11-16 – 2020-11-19 (×3): 650 mg via ORAL
  Filled 2020-11-16 (×3): qty 2

## 2020-11-16 MED ORDER — ACETAMINOPHEN 325 MG PO TABS
650.0000 mg | ORAL_TABLET | ORAL | Status: DC | PRN
  Administered 2020-11-16: 650 mg via ORAL
  Filled 2020-11-16: qty 2

## 2020-11-16 MED ORDER — LACTATED RINGERS IV SOLN: 500.0000 mL | INTRAVENOUS | Status: DC | PRN

## 2020-11-16 MED ORDER — FENTANYL-BUPIVACAINE-NACL 0.5-0.125-0.9 MG/250ML-% EP SOLN
12.0000 mL/h | EPIDURAL | Status: DC | PRN
  Administered 2020-11-16: 12 mL/h via EPIDURAL
  Filled 2020-11-16: qty 250

## 2020-11-16 MED ORDER — ZOLPIDEM TARTRATE 5 MG PO TABS: 5.0000 mg | ORAL_TABLET | Freq: Every evening | ORAL | Status: DC | PRN

## 2020-11-16 MED ORDER — BENZOCAINE-MENTHOL 20-0.5 % EX AERO
1.0000 | INHALATION_SPRAY | CUTANEOUS | Status: DC | PRN
Start: 2020-11-16 — End: 2020-11-19
  Administered 2020-11-16: 1 via TOPICAL
  Filled 2020-11-16: qty 56

## 2020-11-16 MED ORDER — TRANEXAMIC ACID-NACL 1000-0.7 MG/100ML-% IV SOLN
INTRAVENOUS | Status: AC
  Administered 2020-11-16: 1000 mg via INTRAVENOUS
  Filled 2020-11-16: qty 100

## 2020-11-16 MED ORDER — OXYTOCIN-SODIUM CHLORIDE 30-0.9 UT/500ML-% IV SOLN: 2.5000 [IU]/h | INTRAVENOUS | Status: DC

## 2020-11-16 MED ORDER — SENNOSIDES-DOCUSATE SODIUM 8.6-50 MG PO TABS: 2.0000 | ORAL_TABLET | Freq: Every day | ORAL | Status: DC

## 2020-11-16 MED ORDER — LIDOCAINE HCL (PF) 1 % IJ SOLN: 30.0000 mL | INTRAMUSCULAR | Status: DC | PRN

## 2020-11-16 MED ORDER — LACTATED RINGERS IV SOLN
500.0000 mL | Freq: Once | INTRAVENOUS | Status: AC
  Administered 2020-11-16: 500 mL via INTRAVENOUS

## 2020-11-16 NOTE — Progress Notes (Signed)
Charlotte Nelson is a 40 y.o. [id number] at [redacted]w[redacted]d by LMP admitted for induction of labor due to Diabetes and Pre-eclamptic toxemia of pregnancy..  Subjective: Comfortable  After epidural Headache improved.  Objective: BP 137/80   Pulse 81   Temp 98.5 F (36.9 C)   Resp 16   SpO2 98%  No intake/output data recorded. No intake/output data recorded.  FHT:  FHR: 145 bpm, variability: moderate,  accelerations:  Present,  decelerations:  Absent and   UC:   regular, every 2-4 minutes SVE:   Dilation: 4.5 Effacement (%): 50 Station: -2 Exam by:: [employer] RN  Labs: Lab Results  Component Value Date   WBC 10.5 11/16/2020   HGB 13.1 11/16/2020   HCT 41.1 11/16/2020   MCV 78.7 (L) 11/16/2020   PLT 173 11/16/2020   CMP     Component Value Date/Time   NA 137 11/16/2020 0829   K 3.8 11/16/2020 0829   CL 108 11/16/2020 0829   CO2 18 (L) 11/16/2020 0829   GLUCOSE 91 11/16/2020 0829   BUN 20 11/16/2020 0829   CREATININE 0.86 11/16/2020 0829   CREATININE 0.89 09/11/2019 1558   CALCIUM 9.4 11/16/2020 0829   PROT 6.1 (L) 11/16/2020 0829   ALBUMIN 2.5 (L) 11/16/2020 0829   AST 26 11/16/2020 0829   ALT 25 11/16/2020 0829   ALKPHOS 118 11/16/2020 0829   BILITOT 0.7 11/16/2020 0829   GFRNONAA >60 11/16/2020 0829   GFRNONAA 83 09/11/2019 1558   GFRAA 96 09/11/2019 1558    Assessment / Plan: Induction of labor due to preeclampsia and gestational diabetes,  progressing well on pitocin  BS 7  Labor: Progressing normally Preeclampsia:  no signs or symptoms of toxicity, intake and ouput balanced and labs stable Fetal Wellbeing:  Category I Pain Control:  Epidural I/D:  n/a Anticipated MOD:  NSVD  [name] J 11/16/2020, 12:16 PM

## 2020-11-16 NOTE — Lactation Note (Signed)
Lactation Consultation Note  Patient Name: Charlotte Nelson [id number] Date: 11/16/2020 Reason for consult: L&D Initial assessment;Early term 37-38.6wks;Maternal endocrine disorder Age:40 y.o. This is moms 4th baby.  Baby [place] breastfeeding on arrival. Mom and baby breastfeeding well .  Rhythmic sucking and intermittent swallows.  Mom is an experienced breastfeeder but other babies are older.  Urged mom to call lactation as needed. Mom requested to be seen tomorrow vs tonight unless there are any difficulties. Maternal Data Formula Feeding for Exclusion: No Does the patient have breastfeeding experience prior to this delivery?: Yes  Feeding Feeding Type: Breast Fed  LATCH Score Latch: Grasps breast easily, tongue down, lips flanged, rhythmical sucking.  Audible Swallowing: A few with stimulation  Type of Nipple: Everted at rest and after stimulation  Comfort (Breast/Nipple): Soft / non-tender  Hold (Positioning): No assistance needed to correctly position infant at breast.  LATCH Score: 9  Interventions Interventions: Breast feeding basics reviewed  Lactation Tools Discussed/Used     Consult Status Consult Status: Follow-up Date: 11/17/20 (will see 01/26 only if infant having iswsues with feeding or blood sugars/mom requested tomorrow) Follow-up type: In-patient    [facility] 11/16/2020, 4:24 PM

## 2020-11-16 NOTE — H&P (Signed)
Charlotte Nelson is a 40 y.o. female presenting for IOL for PEC and IDDM. OB History    Gravida  4   Para  3   Term  2   Preterm  1   AB  0   Living  3     SAB  0   IAB  0   Ectopic  0   Multiple  0   Live Births  3          Past Medical History:  Diagnosis Date  . Alopecia   . Anemia   . Anxiety   . Depression   . Family history of adverse reaction to anesthesia    PATERNAL GRANDFATHER HAD UNKNOWN PROBLEM  MANY YRS AGO - "IT WASN'T A SERIOUS PROBLEM"  . Gastritis   . GERD (gastroesophageal reflux disease)   . Gestational diabetes    metformin and lantus insulin  . Gestational diabetes mellitus, class A2 07/07/2011   does not normally have diabetes  . Headache(784.0)    migraine  . Kidney stones   . OCD (obsessive compulsive disorder)   . Pregnancy induced hypertension    nifedipine  . Tremor    "familial tremor"   Past Surgical History:  Procedure Laterality Date  . CYSTOSCOPY W/ URETERAL STENT PLACEMENT Left 09/22/2015   Procedure: CYSTOSCOPY WITH LEFT  RETROGRADE PYELOGRAM/ STENT PLACEMENT;  Surgeon: Carolan Clines, MD;  Location: [facility];  Service: Urology;  Laterality: Left;  . LITHOTRIPSY    . MOUTH SURGERY     wisdom teeth and gum x2   Family History: family history includes Alcohol abuse in her maternal grandfather; COPD in her maternal grandmother; Cancer in her maternal grandmother; Depression in her maternal grandmother; Diabetes in her father, maternal grandmother, paternal grandfather, and paternal grandmother; Heart disease in her maternal grandfather, maternal grandmother, and paternal grandfather; Hyperlipidemia in her mother; Hypertension in her father and mother; Migraines in her mother; Miscarriages / Stillbirths in her mother; Nephrolithiasis in her father; Parkinson's disease in her paternal grandfather; Stroke in her maternal grandfather and paternal grandmother; Thrombosis in her father; Thyroid disease in her mother; Tremor in her  father and paternal grandmother; Urolithiasis in her father. Social History:  reports that she has never smoked. She has never used smokeless tobacco. She reports previous alcohol use. She reports that she does not use drugs.     Maternal Diabetes: Yes:  Diabetes Type:  Insulin/Medication controlled Genetic Screening: Normal Maternal Ultrasounds/Referrals: Normal Fetal Ultrasounds or other Referrals:  None Maternal Substance Abuse:  No Significant Maternal Medications:  Meds include: Other:  Significant Maternal Lab Results:  Group B Strep negative Other Comments:  None  Review of Systems  Constitutional: Negative.   All other systems reviewed and are negative.  Maternal Medical History:  Contractions: Onset was less than 1 hour ago.   Frequency: rare.   Perceived severity is mild.    Fetal activity: Perceived fetal activity is normal.    Prenatal complications: Polyhydramnios.   Prenatal Complications - Diabetes: gestational. Diabetes is managed by insulin injections and diet.        unknown if currently breastfeeding. Maternal Exam:  Uterine Assessment: Contraction strength is mild.  Contraction frequency is irregular.   Abdomen: Patient reports no abdominal tenderness. Fetal presentation: vertex  Introitus: Normal vulva. Normal vagina.  Ferning test: not done.  Nitrazine test: not done. Amniotic fluid character: not assessed.  Pelvis: adequate for delivery.   Cervix: Cervix evaluated by digital exam.  Physical Exam Vitals and nursing note reviewed. Exam conducted with a chaperone present.  Constitutional:      Appearance: Normal appearance.  HENT:     Head: Normocephalic and atraumatic.  Cardiovascular:     Rate and Rhythm: Normal rate and regular rhythm.     Pulses: Normal pulses.     Heart sounds: Normal heart sounds.  Pulmonary:     Effort: Pulmonary effort is normal.     Breath sounds: Normal breath sounds.  Abdominal:     Palpations: Abdomen is  soft.  Genitourinary:    General: Normal vulva.  Musculoskeletal:     Cervical back: Normal range of motion and neck supple.  Skin:    General: Skin is warm and dry.  Neurological:     General: No focal deficit present.     Mental Status: She is alert and oriented to person, place, and time.  Psychiatric:        Mood and Affect: Mood normal.        Behavior: Behavior normal.     Prenatal labs: ABO, Rh:  pos Antibody:  neg Rubella:  imm RPR:   neg HBsAg:   neg HIV:   neg GBS:   neg  Assessment/Plan: 37 week IUP PEC w/o severe features IDDM(A2) Admit Ck CBC, CMP BS q4 IOL   [name] 11/16/2020, 7:54 AM

## 2020-11-16 NOTE — Anesthesia Preprocedure Evaluation (Signed)
Anesthesia Evaluation  Patient identified by MRN, date of birth, ID band Patient awake    Reviewed: Allergy & Precautions, H&P , NPO status , Patient's Chart, lab work & pertinent test results  Airway Mallampati: II       Dental no notable dental hx.    Pulmonary neg pulmonary ROS,    Pulmonary exam normal        Cardiovascular hypertension, negative cardio ROS Normal cardiovascular exam     Neuro/Psych  Headaches, PSYCHIATRIC DISORDERS Anxiety Depression  Neuromuscular disease    GI/Hepatic Neg liver ROS, GERD  Medicated and Controlled,  Endo/Other  diabetes, Gestational, Oral Hypoglycemic Agents, Insulin DependentMorbid obesity  Renal/GU negative Renal ROS     Musculoskeletal   Abdominal (+) + obese,   Peds  Hematology   Anesthesia Other Findings   Reproductive/Obstetrics (+) Pregnancy                             Anesthesia Physical Anesthesia Plan  ASA: III  Anesthesia Plan: Epidural   Post-op Pain Management:    Induction:   PONV Risk Score and Plan:   Airway Management Planned:   Additional Equipment:   Intra-op Plan:   Post-operative Plan:   Informed Consent: I have reviewed the patients History and Physical, chart, labs and discussed the procedure including the risks, benefits and alternatives for the proposed anesthesia with the patient or authorized representative who has indicated his/her understanding and acceptance.       Plan Discussed with:   Anesthesia Plan Comments:         Anesthesia Quick Evaluation

## 2020-11-16 NOTE — Plan of Care (Signed)
  Problem: Education: Goal: Knowledge of Childbirth will improve Outcome: Progressing   Problem: Education: Goal: Ability to make informed decisions regarding treatment and plan of care will improve Outcome: Progressing   Problem: Coping: Goal: Ability to verbalize concerns and feelings about labor and delivery will improve Outcome: Progressing   Problem: Pain Management: Goal: Relief or control of pain from uterine contractions will improve Outcome: Progressing   Pt resting in bed post epidural with peanut ball. IUPC placed by MD Taavon for continued oxytocin titration. Call bell at bedside.

## 2020-11-16 NOTE — Anesthesia Procedure Notes (Signed)
Epidural Patient location during procedure: OB Start time: 11/16/2020 10:26 AM End time: 11/16/2020 10:29 AM  Staffing Anesthesiologist: Lyn Hollingshead, MD Performed: anesthesiologist   Preanesthetic Checklist Completed: patient identified, IV checked, site marked, risks and benefits discussed, surgical consent, monitors and equipment checked, pre-op evaluation and timeout performed  Epidural Patient position: sitting Prep: DuraPrep and site prepped and draped Patient monitoring: continuous pulse ox and blood pressure Approach: midline Location: L3-L4 Injection technique: LOR air  Needle:  Needle type: Tuohy  Needle gauge: 17 G Needle length: 9 cm and 9 Needle insertion depth: 6 cm Catheter type: closed end flexible Catheter size: 19 Gauge Catheter at skin depth: 11 cm Test dose: negative and Other  Assessment Events: blood not aspirated, injection not painful, no injection resistance, no paresthesia and negative IV test  Additional Notes Reason for block:procedure for pain

## 2020-11-17 ENCOUNTER — Encounter (HOSPITAL_COMMUNITY): Payer: Self-pay | Admitting: Obstetrics and Gynecology

## 2020-11-17 DIAGNOSIS — O1494 Unspecified pre-eclampsia, complicating childbirth: Secondary | ICD-10-CM

## 2020-11-17 HISTORY — DX: Unspecified pre-eclampsia, complicating childbirth: O14.94

## 2020-11-17 LAB — CBC
HCT: 35.3 % — ABNORMAL LOW (ref 36.0–46.0)
Hemoglobin: 11.3 g/dL — ABNORMAL LOW (ref 12.0–15.0)
MCH: 25.2 pg — ABNORMAL LOW (ref 26.0–34.0)
MCHC: 32 g/dL (ref 30.0–36.0)
MCV: 78.6 fL — ABNORMAL LOW (ref 80.0–100.0)
Platelets: 146 10*3/uL — ABNORMAL LOW (ref 150–400)
RBC: 4.49 MIL/uL (ref 3.87–5.11)
RDW: 15.4 % (ref 11.5–15.5)
WBC: 12.1 10*3/uL — ABNORMAL HIGH (ref 4.0–10.5)
nRBC: 0 % (ref 0.0–0.2)

## 2020-11-17 LAB — GLUCOSE, CAPILLARY
Glucose-Capillary: 131 mg/dL — ABNORMAL HIGH (ref 70–99)
Glucose-Capillary: 165 mg/dL — ABNORMAL HIGH (ref 70–99)

## 2020-11-17 MED ORDER — RHO D IMMUNE GLOBULIN 1500 UNIT/2ML IJ SOSY
300.0000 ug | PREFILLED_SYRINGE | Freq: Once | INTRAMUSCULAR | Status: AC
  Administered 2020-11-17: 300 ug via INTRAVENOUS
  Filled 2020-11-17: qty 2

## 2020-11-17 MED ORDER — OXYCODONE-ACETAMINOPHEN 5-325 MG PO TABS
1.0000 | ORAL_TABLET | ORAL | Status: DC | PRN
  Administered 2020-11-17 – 2020-11-18 (×5): 1 via ORAL
  Filled 2020-11-17 (×5): qty 1

## 2020-11-17 MED ORDER — DOCUSATE SODIUM 100 MG PO CAPS
100.0000 mg | ORAL_CAPSULE | Freq: Every day | ORAL | Status: DC
  Administered 2020-11-17 – 2020-11-19 (×3): 100 mg via ORAL
  Filled 2020-11-17 (×3): qty 1

## 2020-11-17 MED ORDER — NIFEDIPINE ER OSMOTIC RELEASE 30 MG PO TB24
30.0000 mg | ORAL_TABLET | Freq: Every day | ORAL | Status: DC
  Administered 2020-11-17: 30 mg via ORAL
  Filled 2020-11-17: qty 1

## 2020-11-17 NOTE — Lactation Note (Signed)
This note was copied from a baby's chart. Lactation Consultation Note Baby is 32 hrs old. Mom stated the baby just finished BF. Stated baby is BF good. Mom has hand expressed and spoon fed baby some colostrum d/t she was worried about him having a low blood sugar. Noted dried colostrum to lips. Experienced BF mom 9 and 10 months but has been a while.  Mom denies difficulty latching. Newborn behavior, feeding habits, STS, I&O, supply and demand discussed. Mom had GDM and baby is LGA. Noted acrocyanosis around mouth. Baby sleeping. Mom stated they have checked his oxygen and it was normal. Encouraged to call for questions or assistance. Lactation brochure given.   Patient Name: Charlotte Nelson [id number] Date: 11/17/2020 Reason for consult: Initial assessment;Early term 37-38.6wks;Maternal endocrine disorder Age:38 hours  Maternal Data Has patient been taught Hand Expression?: Yes Does the patient have breastfeeding experience prior to this delivery?: Yes  Feeding Feeding Type: Breast Fed  LATCH Score                   Interventions Interventions: Breast feeding basics reviewed  Lactation Tools Discussed/Used WIC Program: No   Consult Status Consult Status: Follow-up Date: 11/17/20 Follow-up type: In-patient    [name], Elta Guadeloupe 11/17/2020, 12:02 AM

## 2020-11-17 NOTE — Progress Notes (Signed)
[name] was referred for history of depression/anxiety. * Referral screened out by Clinical Social Worker because none of the following criteria appear to apply: ~ History of anxiety/depression during this pregnancy, or of post-partum depression following prior delivery. ~ Diagnosis of anxiety and/or depression within last 3 years. Per further chart review, it is noted that [name] was diagnosed with anxiety in 0212 and depression in 2009.  OR * [name]'s symptoms currently being treated with medication and/or therapy.    Please contact the Clinical Social Worker if needs arise, by [facility] request, or if [name] scores greater than 9/yes to question 10 on [place] Postpartum Depression Screen.    Charlotte Nelson, MSW, LCSW [facility] [place] [facility] [place] [phone]

## 2020-11-17 NOTE — Lactation Note (Signed)
This note was copied from a baby's chart. Lactation Consultation Note  Patient Name: Charlotte Nelson ASNKN'L Date: 11/17/2020 Reason for consult: Follow-up assessment;Early term 37-38.6wks Age:39 hours  Follow up to 61 hours old infant with 4.11% weight loss at the time of visit. Mother reports breastfeeding is going well.  Mother reports infant is cluster-feeding. Discussed ways to get infant full and content. Mother is experienced with hand expression and quickly collected ~59mL of EBM in a spoon. LC provided colostrum vials.  FOI changed stool. Mother latched infant independently, great alignment and deep latch. Mother mentioned some nipple discomfort and sensitivity. Mother was given coconut oil. Encouraged parents to review [employer] booklet to refresh breastfeeding knowledge. Mother reports last time breastfeeding was 9 years ago. Praised parents for their efforts and dedication.   Feeding plan:  1. Breastfeed following hunger cues.  2. Offer breast 8 - 12 times in 24h period to establish good milk supply.   3. Pump or hand-express and offer EBM. 4. Ensure infant has a deep latch and/or chin tugging to improve latch.   5. Encourage maternal rest, hydration and food intake.  6. Contact Lactation Services or local resources for support, questions or concerns.     Maternal Data Formula Feeding for Exclusion: No Has patient been taught Hand Expression?: No Does the patient have breastfeeding experience prior to this delivery?: Yes  Feeding Feeding Type: Breast Fed  LATCH Score Latch: Grasps breast easily, tongue down, lips flanged, rhythmical sucking.  Audible Swallowing: Spontaneous and intermittent  Type of Nipple: Everted at rest and after stimulation  Comfort (Breast/Nipple): Filling, red/small blisters or bruises, mild/mod discomfort  Hold (Positioning): No assistance needed to correctly position infant at breast.  LATCH Score: 9  Interventions Interventions: Breast  feeding basics reviewed;Skin to skin;Breast massage;Hand express;Expressed milk  Consult Status Consult Status: Follow-up Date: 11/18/20 Follow-up type: In-patient    Lydell Moga A Higuera Ancidey 11/17/2020, 11:07 PM

## 2020-11-17 NOTE — Progress Notes (Signed)
Post Partum Day 1 s/p SVD complicated by preE w/out SF and GDMA2 on insulin Subjective: Patient doing well this morning. Does complain of very strong and painful uterine contractions and cramping. Has been taking Motrin and just received Percocet, waiting for that to take effect, also using heat packs. States VB has slowed down significantly overnight. Ambulating to the bathroom without dizziness. Denies any HA, vision changes, RUQ/epigastric pain, CP, or SOB. Spontaneously voiding without difficulties. Tolerating a regular diet, denies N/V.  Baby boy is doing well at bedside s/p circumcision. Breastfeeding is going well.   Objective: Patient Vitals for the past 24 hrs:  BP Temp Temp src Pulse Resp SpO2  11/17/20 0815 (!) 154/101 97.8 F (36.6 C) Oral 88 18 98 %  11/17/20 0435 (!) 146/100 97.9 F (36.6 C) Oral 96 18 99 %  11/17/20 0030 (!) 128/92 98.1 F (36.7 C) Oral 97 16 98 %  11/16/20 2025 (!) 148/92 97.9 F (36.6 C) Oral 89 16 99 %  11/16/20 1921 (!) 146/96 98.3 F (36.8 C) Oral 92 16 98 %  11/16/20 1805 (!) 145/93 -- -- 93 16 --  11/16/20 1745 136/78 98 F (36.7 C) Oral 76 16 --  11/16/20 1715 140/82 -- -- 80 18 --  11/16/20 1645 (!) 141/81 -- -- 80 16 --  11/16/20 1630 133/78 -- -- 84 18 --  11/16/20 1615 133/90 -- -- 85 16 --  11/16/20 1600 (!) 135/91 -- -- -- 18 --  11/16/20 1547 120/72 -- -- 97 18 --  11/16/20 1538 120/76 -- -- 96 16 --  11/16/20 1500 134/80 -- -- 82 16 98 %  11/16/20 1430 122/76 -- -- 83 18 96 %  11/16/20 1400 127/81 98 F (36.7 C) -- 70 16 99 %  11/16/20 1330 112/79 -- -- 86 -- 99 %  11/16/20 1300 114/66 -- -- 80 -- 98 %  11/16/20 1230 128/88 -- -- 93 16 96 %  11/16/20 1130 137/80 -- -- 81 -- 98 %  11/16/20 1100 135/85 -- -- 82 16 --  11/16/20 1055 137/86 -- -- 80 18 --  11/16/20 1050 (!) 148/97 -- -- 87 18 --  11/16/20 1045 (!) 146/92 98.5 F (36.9 C) -- 87 16 --  11/16/20 1040 (!) 147/92 -- -- 98 -- --  11/16/20 1035 (!) 147/82 -- -- 98 16 --   11/16/20 1032 (!) 149/81 -- -- 95 18 --  11/16/20 1030 (!) 144/91 -- -- (!) 228 18 --    Physical Exam:  General: alert and no distress Lochia: appropriate Uterine Fundus: firm DVT Evaluation: No evidence of DVT seen on physical exam.  Recent Labs    11/16/20 0829 11/17/20 0641  WBC 10.5 12.1*  HGB 13.1 11.3*  HCT 41.1 35.3*  PLT 173 146*    Recent Labs    11/16/20 0829  NA 137  K 3.8  CL 108  BUN 20  CREATININE 0.86  GLUCOSE 91  BILITOT 0.7  ALT 25  AST 26  ALKPHOS 118  PROT 6.1*  ALBUMIN 2.5*    Recent Labs    11/16/20 0829  CALCIUM 9.4    No results for input(s): PROTIME, APTT, INR in the last 72 hours.  No results for input(s): PROTIME, APTT, INR, FIBRINOGEN in the last 72 hours. Assessment/Plan: Plan for discharge tomorrow  Henley Huestis 40 y.o. [id number] PPD#1 sp SVD at 39.0 IOL for preE w/out SF on Procardia and GDMA2 on Metformin and Insulin 1. PPC: cont  routine PP care with Motrin/Percocet PRN pain, regular diet, and encouraged ambulation 2. PreE w/out SF- no known preexisting HTN, was on Procardia 30XL BID in the pregnancy, BP's 140-150s/90s overnight, restarted on Procardia 30XL this morning- monitor BP's and adjust antihypertensive regimen as needed. [place] labs on admission, slight drop in platelets this morning at 146, asymptomatic cont to monitor 3. GDMA2- CBG monitoring WNL intrapartum, d/c insulin and metformin, cont CBG monitoring q6hr PPD1 4. RH NEG, f/u baby ABORh and Rhogam per protocol 5. Lactation consult PRN 6. Routine PP care 7. Dispo: plan DC home tomorrow pending BP control   LOS: 1 day   Givanni Staron A Yarima Penman 11/17/2020, 10:19 AM

## 2020-11-17 NOTE — Anesthesia Postprocedure Evaluation (Signed)
Anesthesia Post Note  Patient: Charlotte Nelson  Procedure(s) Performed: AN AD HOC LABOR EPIDURAL     Patient location during evaluation: Mother Baby Anesthesia Type: Epidural Level of consciousness: awake and alert Pain management: pain level controlled Vital Signs Assessment: post-procedure vital signs reviewed and stable Respiratory status: spontaneous breathing, nonlabored ventilation and respiratory function stable Cardiovascular status: stable Postop Assessment: no headache, no backache and epidural receding Anesthetic complications: no   No complications documented.  Last Vitals:  Vitals:   11/17/20 0435 11/17/20 0815  BP: (!) 146/100 (!) 154/101  Pulse: 96 88  Resp: 18 18  Temp: 36.6 C 36.6 C  SpO2: 99% 98%    Last Pain:  Vitals:   11/17/20 0826  TempSrc:   PainSc: 0-No pain   Pain Goal: Patients Stated Pain Goal: 2 (11/16/20 1347)              Epidural/Spinal Function Cutaneous sensation: Normal sensation (11/17/20 0815), Patient able to flex knees: Yes (11/17/20 0815), Patient able to lift hips off bed: Yes (11/17/20 0815), Back pain beyond tenderness at insertion site: No (11/17/20 0815), Progressively worsening motor and/or sensory loss: No (11/17/20 0815), Bowel and/or bladder incontinence post epidural: No (11/17/20 0815)  [name]

## 2020-11-18 LAB — CBC
HCT: 36.1 % (ref 36.0–46.0)
Hemoglobin: 11.5 g/dL — ABNORMAL LOW (ref 12.0–15.0)
MCH: 25.2 pg — ABNORMAL LOW (ref 26.0–34.0)
MCHC: 31.9 g/dL (ref 30.0–36.0)
MCV: 79 fL — ABNORMAL LOW (ref 80.0–100.0)
Platelets: 169 10*3/uL (ref 150–400)
RBC: 4.57 MIL/uL (ref 3.87–5.11)
RDW: 15.7 % — ABNORMAL HIGH (ref 11.5–15.5)
WBC: 12.5 10*3/uL — ABNORMAL HIGH (ref 4.0–10.5)
nRBC: 0 % (ref 0.0–0.2)

## 2020-11-18 LAB — RH IG WORKUP (INCLUDES ABO/RH)
ABO/RH(D): O NEG
Fetal Screen: NEGATIVE
Gestational Age(Wks): 37
Unit division: 0

## 2020-11-18 LAB — BASIC METABOLIC PANEL
Anion gap: 11 (ref 5–15)
BUN: 13 mg/dL (ref 6–20)
CO2: 21 mmol/L — ABNORMAL LOW (ref 22–32)
Calcium: 8.5 mg/dL — ABNORMAL LOW (ref 8.9–10.3)
Chloride: 105 mmol/L (ref 98–111)
Creatinine, Ser: 0.79 mg/dL (ref 0.44–1.00)
GFR, Estimated: >60 mL/min (ref >60)
Glucose, Bld: 130 mg/dL — ABNORMAL HIGH (ref 70–99)
Potassium: 3.7 mmol/L (ref 3.5–5.1)
Sodium: 137 mmol/L (ref 135–145)

## 2020-11-18 LAB — COMPREHENSIVE METABOLIC PANEL
ALT: 23 U/L (ref 0–44)
AST: 28 U/L (ref 15–41)
Albumin: 2.2 g/dL — ABNORMAL LOW (ref 3.5–5.0)
Alkaline Phosphatase: 100 U/L (ref 38–126)
Anion gap: 10 (ref 5–15)
BUN: 10 mg/dL (ref 6–20)
CO2: 22 mmol/L (ref 22–32)
Calcium: 8.6 mg/dL — ABNORMAL LOW (ref 8.9–10.3)
Chloride: 107 mmol/L (ref 98–111)
Creatinine, Ser: 0.96 mg/dL (ref 0.44–1.00)
GFR, Estimated: >60 mL/min (ref >60)
Glucose, Bld: 202 mg/dL — ABNORMAL HIGH (ref 70–99)
Potassium: 4.1 mmol/L (ref 3.5–5.1)
Sodium: 139 mmol/L (ref 135–145)
Total Bilirubin: 0.3 mg/dL (ref 0.3–1.2)
Total Protein: 5.5 g/dL — ABNORMAL LOW (ref 6.5–8.1)

## 2020-11-18 LAB — GLUCOSE, CAPILLARY
Glucose-Capillary: 93 mg/dL (ref 70–99)
Glucose-Capillary: 95 mg/dL (ref 70–99)
Glucose-Capillary: 162 mg/dL — ABNORMAL HIGH (ref 70–99)
Glucose-Capillary: 141 mg/dL — ABNORMAL HIGH (ref 70–99)

## 2020-11-18 LAB — URIC ACID: Uric Acid, Serum: 7.1 mg/dL (ref 2.5–7.1)

## 2020-11-18 MED ORDER — LABETALOL HCL 5 MG/ML IV SOLN: 80.0000 mg | INTRAVENOUS | Status: DC | PRN

## 2020-11-18 MED ORDER — LABETALOL HCL 200 MG PO TABS
200.0000 mg | ORAL_TABLET | Freq: Three times a day (TID) | ORAL | Status: DC
  Administered 2020-11-18 – 2020-11-19 (×3): 200 mg via ORAL
  Filled 2020-11-18 (×3): qty 1

## 2020-11-18 MED ORDER — HYDRALAZINE HCL 10 MG PO TABS
10.0000 mg | ORAL_TABLET | Freq: Four times a day (QID) | ORAL | Status: DC | PRN
  Filled 2020-11-18: qty 1

## 2020-11-18 MED ORDER — HYDRALAZINE HCL 10 MG PO TABS
10.0000 mg | ORAL_TABLET | Freq: Four times a day (QID) | ORAL | Status: DC
  Filled 2020-11-18 (×3): qty 1

## 2020-11-18 MED ORDER — LABETALOL HCL 5 MG/ML IV SOLN: 40.0000 mg | INTRAVENOUS | Status: DC | PRN

## 2020-11-18 MED ORDER — LABETALOL HCL 5 MG/ML IV SOLN: 20.0000 mg | INTRAVENOUS | Status: DC | PRN

## 2020-11-18 MED ORDER — LABETALOL HCL 5 MG/ML IV SOLN
INTRAVENOUS | Status: AC
  Administered 2020-11-18: 20 mg via INTRAVENOUS
  Filled 2020-11-18: qty 4

## 2020-11-18 MED ORDER — NIFEDIPINE ER OSMOTIC RELEASE 30 MG PO TB24
30.0000 mg | ORAL_TABLET | Freq: Once | ORAL | Status: AC
  Administered 2020-11-18: 30 mg via ORAL
  Filled 2020-11-18: qty 1

## 2020-11-18 MED ORDER — NIFEDIPINE ER OSMOTIC RELEASE 30 MG PO TB24
30.0000 mg | ORAL_TABLET | Freq: Two times a day (BID) | ORAL | Status: DC
  Administered 2020-11-18 – 2020-11-19 (×3): 30 mg via ORAL
  Filled 2020-11-18 (×3): qty 1

## 2020-11-18 NOTE — Lactation Note (Signed)
This note was copied from a baby's chart. Lactation Consultation Note  Patient Name: Charlotte Nelson [id number]'[id number] Date: 11/18/2020 Reason for consult: Follow-up assessment Age:39 hours  Follow up 39 hours old infant with 8.46% weight loss at the time of visit. Mother is holding infant upon arrival. Mother states infant has been very sleepy and uninterested at the breast. LC encouraged mother to unswaddle and undress prior to feeding to let infant wake up. Discussed normal behavior of ETI. Talked about hand expression and using a DEBP to have EBM available to supplement infant as needed. Mother agreed to [facility] and [facility] set it up. Reviewed EBM storage, pumping frequency and care of pump parts  Infant is showing hunger cues. Mother requests assistance with latch, modified cradle, right breast. Infant able to latch with ease. Noted suckling and audible swallows. Encouraged using support pillows to discourage slipping into a shallow latch. Reinforced neck extension and back support. Infant breastfed for 8 minutes and pop off breast. Mother burped infant but he fell asleep. Lasana placed him back in the basinet and he was showing hunger cues a few minutes after. Mother request assistance to football latch to left breast. Infant latched at once and was still breastfeeding upon [facility] leaving room.  Feeding plan:  1. Breastfeed following hunger cues.  2. Keep infant awake during breastfeeding session: massaging breast, infant's hand/shoulder/feet 3. Offer breast 8 - 12 times in 24h period to establish good milk supply.   4. Pump or hand-express and offer EBM.   5. Monitor voids and stools as signs good intake 6. Encouraged maternal rest, hydration and food intake.  7. Contact Lactation Services or local resources for support, questions or concerns.    All questions answered at this time.    Maternal Data Formula Feeding for Exclusion: No Has patient been taught Hand Expression?: No Does the patient have  breastfeeding experience prior to this delivery?: Yes  Feeding Feeding Type: Breast Fed  LATCH Score Latch: Grasps breast easily, tongue down, lips flanged, rhythmical sucking.  Audible Swallowing: Spontaneous and intermittent  Type of Nipple: Everted at rest and after stimulation  Comfort (Breast/Nipple): Soft / non-tender  Hold (Positioning): Assistance needed to correctly position infant at breast and maintain latch.  LATCH Score: 9  Interventions Interventions: Assisted with latch;Skin to skin;Breast feeding basics reviewed;Breast massage;Hand express;Adjust position;Support pillows;Position options;Expressed milk;DEBP  Lactation Tools Discussed/Used Pump Education: Setup, frequency, and cleaning;Milk Storage Initiated by:: Jennylee Uehara IBCLC Date initiated:: 11/18/20   Consult Status Consult Status: Follow-up Date: 11/19/20 Follow-up type: In-patient    Tamon Parkerson A Higuera Ancidey 11/18/2020, 4:55 PM

## 2020-11-18 NOTE — Progress Notes (Signed)
PP#2 s/p SVD, IOL for PEC, non-severe  S: Overnight with severe range bps, added back additionl Procardia 30mg  XL but 1 hr later bps still elevated, labetalol 20mg  IV push given to bring bp into non-severe but still elevated range. Pt notes at the time of high bp she was very stressed, unable to sleep due to baby cluster feeding then very anxious over bp readings.  BMP last night normal, though verbal order was for CMP.   Pt notes no current HA, no HA last night when bp was elevated, no scotomata. Pt notes minimal lochia, no CP, no SOB. Pt notes vague upper abdominal cramping with some radiation to back, b/l.   O:  Vitals:   11/18/20 0347 11/18/20 0500 11/18/20 0905 11/18/20 1105  BP: (!) 154/100 (!) 146/106 (!) 146/100 (!) 157/103  Pulse:   80 87  Resp:  16 18 18   Temp:  98 F (36.7 C) 97.9 F (36.6 C)   TempSrc:  Oral Oral   SpO2:  99% 98% 98%   Gen: no distress, non-toxic CV: RRR Pulm CTAB Abd: no RUQ pain, NT, NT, fundus firm/ NT at umbilicus LE: NT, trace edema, no clonus, 2+ DTR  CMP pending CBC Latest Ref Rng & Units 11/18/2020 11/17/2020 11/16/2020  WBC 4.0 - 10.5 K/uL 12.5(H) 12.1(H) 10.5  Hemoglobin 12.0 - 15.0 g/dL 11.5(L) 11.3(L) 13.1  Hematocrit 36.0 - 46.0 % 36.1 35.3(L) 41.1  Platelets 150 - 400 K/uL 169 146(L) 173   A/P: Gestational htn with severe range bps x 1with good resolution on 1 IV dos labetalol. Clinically pt without PEC though vague abd pain needs further assessment. Awaiting CMP results. If abnl LFTS and continued high bps will consider Mag. Given am bps still suboptimal despite increase in procardia last night will add Labetalol now.  Ala Dach 11/18/2020 12:08 PM

## 2020-11-19 LAB — GLUCOSE, CAPILLARY: Glucose-Capillary: 148 mg/dL — ABNORMAL HIGH (ref 70–99)

## 2020-11-19 MED ORDER — LABETALOL HCL 200 MG PO TABS: 200.0000 mg | ORAL_TABLET | Freq: Three times a day (TID) | ORAL | 1 refills | Status: DC

## 2020-11-19 NOTE — Progress Notes (Signed)
Called Dr Ronita Hipps to review Pt's AVS which states to continue insulin and Metformin. Dr Ronita Hipps states to instruct pt not to continue insulin and Metformin. Pt requests a prescription for Oxy Ir or percocet. Dr Ronita Hipps states he will work on prescription but pt may need to call the office Monday.

## 2020-11-19 NOTE — Progress Notes (Signed)
PP#3 s/p SVD, IOL for PEC, non-severe  S:  Pt notes no current HA, no HA last night when bp was elevated, no scotomata. Pt notes minimal lochia, no CP, no SOB. Pt notes vague upper abdominal cramping with some radiation to back, b/l.   O:  Vitals:   11/18/20 1520 11/18/20 1930 11/18/20 2330 11/19/20 0335  BP: (!) 141/88 (!) 150/98 (!) 146/95 140/86  Pulse: 71 92 93 86  Resp:   18 18  Temp:   98.1 F (36.7 C)   TempSrc:   Oral   SpO2:       Gen: no distress, non-toxic CV: RRR Pulm CTAB Abd: no RUQ pain, NT, NT, fundus firm/ NT at umbilicus LE: NT, trace edema, no clonus, 2+ DTR  CMP pending CBC Latest Ref Rng & Units 11/18/2020 11/17/2020 11/16/2020  WBC 4.0 - 10.5 K/uL 12.5(H) 12.1(H) 10.5  Hemoglobin 12.0 - 15.0 g/dL 11.5(L) 11.3(L) 13.1  Hematocrit 36.0 - 46.0 % 36.1 35.3(L) 41.1  Platelets 150 - 400 K/uL 169 146(L) 173   A/P: PEC w/o severe features now PP BPs stable now on labetalol and procardia Will dc home today.  Tiane Szydlowski J 11/19/2020 5:49 AM

## 2020-11-19 NOTE — Lactation Note (Signed)
This note was copied from a baby's chart. Lactation Consultation Note  Patient Name: Charlotte Nelson [id number] Date: 11/19/2020 Reason for consult: Follow-up assessment Age:39 hours   P4 mother whose infant is now 74 hours old.  This is an ETI at 39+41 weeks old.  Baby has an 11% weight loss this morning.  Baby was asleep in the bassinet when I arrived.  Mother had a few questions that I answered to her satisfaction.  With the large weight loss I emphasized the importance of feeding at least every three hours or sooner if baby shows feeding cues.  Mother will continue to supplement with 30+ mls with every feeding.  Encouraged to also continue pumping after breast feeding so she can provide her EBM as supplementation.  Mother's breasts are fuller and heavier this morning.  Reviewed engorgement prevention/treatment.  Mother has experienced this in the past.  She has a manual pump and a DEBP for home use.  Father present.  Family has a cart for belongings and the RN has finished discharge instructions.  Mother has our OP phone number for any concerns after discharge.   Maternal Data    Feeding Feeding Type: Breast Fed  LATCH Score                   Interventions    Lactation Tools Discussed/Used     Consult Status Consult Status: Complete Date: 11/19/20 Follow-up type: Call as needed    Charlotte Nelson 11/19/2020, 11:50 AM

## 2020-11-19 NOTE — Discharge Summary (Signed)
Postpartum Discharge Summary  Date of Service updated1/29/22     Patient Name: Charlotte Nelson DOB: 11/12/1980 MRN: [id number]  Date of admission: 11/16/2020 Delivery date:11/16/2020  Delivering provider: Brien Few  Date of discharge: 11/19/2020  Admitting diagnosis: Encounter for elective induction of labor [Z34.90] Intrauterine pregnancy: [redacted]w[redacted]d    Secondary diagnosis:  Principal Problem:   Postpartum care following vaginal delivery Active Problems:   Insulin controlled White classification A2 gestational diabetes mellitus (GDM)   Encounter for elective induction of labor   Hypertension in pregnancy, preeclampsia, delivered  Additional problems: na    Discharge diagnosis: Preeclampsia (mild)                                              Post partum procedures:na Augmentation: AROM and Pitocin Complications: None  Hospital course: Induction of Labor With Vaginal Delivery   40y.o. yo [id number] admitted to the hospital 11/16/2020 for induction of labor.  Indication for induction: Preeclampsia.  Patient had an uncomplicated labor course as follows: Membrane Rupture Time/Date: 8:55 AM ,11/16/2020   Delivery Method:Vaginal, Spontaneous  Episiotomy: None  Lacerations:  None  Details of delivery can be found in separate delivery note.  Patient had a routine postpartum course. Patient is discharged home 11/19/20.  Newborn Data: Birth date:11/16/2020  Birth time:3:29 PM  Gender:Female  Living status:Living  Apgars:8 ,9  Weight:4255 g   Magnesium Sulfate received: No BMZ received: Yes Rhophylac:N/A MMR:Yes T-DaP:Given prenatally Flu: Yes Transfusion:No  Physical exam  Vitals:   11/18/20 1520 11/18/20 1930 11/18/20 2330 11/19/20 0335  BP: (!) 141/88 (!) 150/98 (!) 146/95 140/86  Pulse: 71 92 93 86  Resp:   18 18  Temp:   98.1 F (36.7 C)   TempSrc:   Oral   SpO2:       General: alert, cooperative and no distress Lochia: appropriate Uterine Fundus:  firm Incision: Healing well with no significant drainage DVT Evaluation: No evidence of DVT seen on physical exam. Labs: Lab Results  Component Value Date   WBC 12.5 (H) 11/18/2020   HGB 11.5 (L) 11/18/2020   HCT 36.1 11/18/2020   MCV 79.0 (L) 11/18/2020   PLT 169 11/18/2020   CMP Latest Ref Rng & Units 11/18/2020  Glucose 70 - 99 mg/dL 202(H)  BUN 6 - 20 mg/dL 10  Creatinine 0.44 - 1.00 mg/dL 0.96  Sodium 135 - 145 mmol/L 139  Potassium 3.5 - 5.1 mmol/L 4.1  Chloride 98 - 111 mmol/L 107  CO2 22 - 32 mmol/L 22  Calcium 8.9 - 10.3 mg/dL 8.6(L)  Total Protein 6.5 - 8.1 g/dL 5.5(L)  Total Bilirubin 0.3 - 1.2 mg/dL 0.3  Alkaline Phos 38 - 126 U/L 100  AST 15 - 41 U/L 28  ALT 0 - 44 U/L 23   Edinburgh Score: No flowsheet data found.    After visit meds:  Allergies as of 11/19/2020      Reactions   Citalopram Hydrobromide Itching   Pt states that this medication causes prolonged itching.....lasting for about a month.   Cymbalta [duloxetine Hcl]    Sulfa Antibiotics Other (See Comments)   Viibryd [vilazodone Hcl]    Tremor worsening.       Medication List    TAKE these medications   azelastine 0.05 % ophthalmic solution Commonly known as: OPTIVAR Place 2  drops into both eyes 2 (two) times daily.   BioGaia Probiotic Liqd Take 5 drops by mouth daily at 8 pm.   butalbital-acetaminophen-caffeine 50-325-40 MG tablet Commonly known as: FIORICET Take 1-2 tablets by mouth every 6 (six) hours as needed for headache.   docusate sodium 100 MG capsule Commonly known as: COLACE Take 100 mg by mouth 2 (two) times daily.   insulin glargine 100 UNIT/ML injection Commonly known as: LANTUS Inject 18 Units into the skin at bedtime.   ipratropium 0.06 % nasal spray Commonly known as: ATROVENT Place 2 sprays into both nostrils 4 (four) times daily.   labetalol 200 MG tablet Commonly known as: NORMODYNE Take 1 tablet (200 mg total) by mouth 3 (three) times daily.   metFORMIN  500 MG tablet Commonly known as: GLUCOPHAGE Take 500 mg by mouth 2 (two) times daily with a meal.   NIFEdipine 10 MG capsule Commonly known as: PROCARDIA Take 30 mg by mouth daily.   pantoprazole 40 MG tablet Commonly known as: PROTONIX TAKE ONE TABLET BY MOUTH EVERY NIGHT AT BEDTIME   prenatal multivitamin Tabs tablet Take 1 tablet by mouth daily at 12 noon.        Discharge home in stable condition Infant Feeding: Breast Infant Disposition:home with mother Discharge instruction: per After Visit Summary and Postpartum booklet. Activity: Advance as tolerated. Pelvic rest for 6 weeks.  Diet: routine diet Anticipated Birth Control: Unsure Postpartum Appointment:1 week Additional Postpartum F/U: na Future Appointments:No future appointments. Follow up Visit:      11/19/2020 Lovenia Kim, MD

## 2021-06-09 ENCOUNTER — Ambulatory Visit (INDEPENDENT_AMBULATORY_CARE_PROVIDER_SITE_OTHER): Payer: 59 | Admitting: Physician Assistant

## 2021-06-09 ENCOUNTER — Encounter: Payer: Self-pay | Admitting: Physician Assistant

## 2021-06-09 ENCOUNTER — Other Ambulatory Visit: Payer: Self-pay

## 2021-06-09 VITALS — BP 135/82 | HR 94 | Ht 64.0 in | Wt 184.0 lb

## 2021-06-09 DIAGNOSIS — I82611 Acute embolism and thrombosis of superficial veins of right upper extremity: Secondary | ICD-10-CM | POA: Diagnosis not present

## 2021-06-09 DIAGNOSIS — L723 Sebaceous cyst: Secondary | ICD-10-CM | POA: Diagnosis not present

## 2021-06-09 DIAGNOSIS — L089 Local infection of the skin and subcutaneous tissue, unspecified: Secondary | ICD-10-CM | POA: Diagnosis not present

## 2021-06-09 DIAGNOSIS — Z8249 Family history of ischemic heart disease and other diseases of the circulatory system: Secondary | ICD-10-CM | POA: Diagnosis not present

## 2021-06-09 DIAGNOSIS — I808 Phlebitis and thrombophlebitis of other sites: Secondary | ICD-10-CM

## 2021-06-09 MED ORDER — DOXYCYCLINE HYCLATE 100 MG PO TABS
100.0000 mg | ORAL_TABLET | Freq: Two times a day (BID) | ORAL | 0 refills | Status: AC
Start: 2021-06-09 — End: 2021-06-12

## 2021-06-09 NOTE — Progress Notes (Signed)
Subjective:    Patient ID: Charlotte Nelson, female    DOB: [birth year], 40 y.o.   MRN: [id number]  HPI Pt is a 40 yo 7 months post-partum female who presents to the clinic with blue mass in right index finger for last month. It is tender. No swelling. No known trauma. She has done research and believes it to be palmar digit thrombosis. She is concerned about clotting with her family hx of father and DVT and paternal grandmother and PE. She had no clotting issues with pregnancy or delivery. No SOB, chest pain, calf pain or swelling.   To note she did have covid back in October with no complications.    Pt recently was given keflex for infected sebaceous cyst by OB/GYN. It is drastically decreasing her milk supply. She has 3 days left and would like to be switched to another medication to finish out. Cyst is under her left breast.   .. Active Ambulatory Problems    Diagnosis Date Noted   Insulin controlled White classification A2 gestational diabetes mellitus (GDM) 07/07/2011   Annual physical exam 05/12/2015   Left nephrolithiasis 05/12/2015   Essential tremor 05/12/2015   Mood disorder ([place]) 05/12/2015   Microcytosis 05/12/2015   Dyspepsia 06/09/2015   Migraine headache 07/07/2015   Immunity status testing 05/01/2016   Not immune to hepatitis B virus 05/03/2016   LPRD (laryngopharyngeal reflux disease) 11/25/2017   Allergic conjunctivitis with xerophthalmia 01/22/2019   DDD (degenerative disc disease), lumbar 02/03/2019   Cubital tunnel syndrome, left 02/24/2019   Screening for hyperlipidemia 04/27/2019   Hair loss 05/22/2019   Palpitations 09/11/2019   Right wrist pain 11/06/2019   Perennial allergic rhinitis 11/06/2019   Dysuria 12/04/2019   Encounter for elective induction of labor 11/16/2020   Hypertension in pregnancy, preeclampsia, delivered 11/17/2020   Postpartum care following vaginal delivery 11/17/2020   Infected sebaceous cyst 06/13/2021   Superficial venous  thrombosis of upper extremity, right 06/13/2021   Lactating mother 06/13/2021   Resolved Ambulatory Problems    Diagnosis Date Noted   Postpartum care following vaginal delivery (9/14) 07/07/2011   Thyroid nodule 07/07/2015   Viral URI 08/01/2015   Postnasal drip 09/02/2015   Bilateral ankle pain 11/04/2015   Fatigue 01/06/2016   Focal neurological deficit 03/30/2016   Laceration of skin of right hand 06/08/2016   Acute abdominal pain in right lower quadrant 06/19/2016   Lymphadenopathy, preauricular 11/25/2017   Acute cystitis 02/24/2019   Past Medical History:  Diagnosis Date   Alopecia    Anemia    Anxiety    Depression    Family history of adverse reaction to anesthesia    Gastritis    GERD (gastroesophageal reflux disease)    Gestational diabetes    Gestational diabetes mellitus, class A2 07/07/2011   Headache(784.0)    Kidney stones    OCD (obsessive compulsive disorder)    Pregnancy induced hypertension    Tremor      Review of Systems    See HPI.  Objective:   Physical Exam  Right index finger:  .14m blue mobile mass anterior btw PIP and DIP joints.   1cm by 1cm erythematous sebaceous cyst under left breast.       Assessment & Plan:  .Marland KitchenMarland KitchenJaxsynwas seen today for hand pain.  Diagnoses and all orders for this visit:  Superficial venous thrombosis of upper extremity, right -     CBC w/Diff/Platelet -     COMPLETE METABOLIC PANEL WITH GFR -  Protime-INR -     Fibrinogen -     APTT; Future -     Factor 5 leiden -     Sed Rate (ESR) -     Antithrombin III -     Protein S, total and functional panel -     Von Willebrand Antigen -     Protein C, total -     Homocysteine  Infected sebaceous cyst -     doxycycline (VIBRA-TABS) 100 MG tablet; Take 1 tablet (100 mg total) by mouth 2 (two) times daily for 3 days.  Lactating mother  Reassured patient that thrombosis is superficial and not at risk for this moving into deep vein and causing problems.  Some compression, massage, heat, NSAID for a few days and should resolve. With her family hx of clots there is some concern of genetic mutations. Will screen with a hypercoagulable panel for some reassurance.   Stop keflex. Finish last 3 days with doxycycline. Reassured safe for BF as long as not taken for longer than 3 weeks.  

## 2021-06-13 ENCOUNTER — Encounter: Payer: Self-pay | Admitting: Physician Assistant

## 2021-06-13 DIAGNOSIS — Z8249 Family history of ischemic heart disease and other diseases of the circulatory system: Secondary | ICD-10-CM | POA: Insufficient documentation

## 2021-06-13 DIAGNOSIS — I82611 Acute embolism and thrombosis of superficial veins of right upper extremity: Secondary | ICD-10-CM | POA: Insufficient documentation

## 2021-06-13 DIAGNOSIS — L723 Sebaceous cyst: Secondary | ICD-10-CM | POA: Insufficient documentation

## 2021-06-13 DIAGNOSIS — L089 Local infection of the skin and subcutaneous tissue, unspecified: Secondary | ICD-10-CM | POA: Insufficient documentation

## 2021-06-13 NOTE — Progress Notes (Signed)
So far labs look good. Your antithrombin level is actually a little high which means you have more blocking power to get rid of clots.

## 2021-06-18 LAB — PROTIME-INR
INR: 1
Prothrombin Time: 9.8 s (ref 9.0–11.5)

## 2021-06-18 LAB — CBC WITH DIFFERENTIAL/PLATELET
Absolute Monocytes: 606 cells/uL (ref 200–950)
Basophils Absolute: 33 cells/uL (ref 0–200)
Basophils Relative: 0.4 %
Eosinophils Absolute: 199 cells/uL (ref 15–500)
Eosinophils Relative: 2.4 %
HCT: 43.4 % (ref 35.0–45.0)
Hemoglobin: 13.9 g/dL (ref 11.7–15.5)
Lymphs Abs: 2208 cells/uL (ref 850–3900)
MCH: 25.6 pg — ABNORMAL LOW (ref 27.0–33.0)
MCHC: 32 g/dL (ref 32.0–36.0)
MCV: 79.9 fL — ABNORMAL LOW (ref 80.0–100.0)
MPV: 10.7 fL (ref 7.5–12.5)
Monocytes Relative: 7.3 %
Neutro Abs: 5254 cells/uL (ref 1500–7800)
Neutrophils Relative %: 63.3 %
Platelets: 310 10*3/uL (ref 140–400)
RBC: 5.43 10*6/uL — ABNORMAL HIGH (ref 3.80–5.10)
RDW: 13 % (ref 11.0–15.0)
Total Lymphocyte: 26.6 %
WBC: 8.3 10*3/uL (ref 3.8–10.8)

## 2021-06-18 LAB — COMPLETE METABOLIC PANEL WITH GFR
AG Ratio: 1.6 (calc) (ref 1.0–2.5)
ALT: 14 U/L (ref 6–29)
AST: 14 U/L (ref 10–30)
Albumin: 4.6 g/dL (ref 3.6–5.1)
Alkaline phosphatase (APISO): 90 U/L (ref 31–125)
BUN: 20 mg/dL (ref 7–25)
CO2: 27 mmol/L (ref 20–32)
Calcium: 9.9 mg/dL (ref 8.6–10.2)
Chloride: 104 mmol/L (ref 98–110)
Creat: 0.75 mg/dL (ref 0.50–0.97)
Globulin: 2.8 g/dL (calc) (ref 1.9–3.7)
Glucose, Bld: 95 mg/dL (ref 65–99)
Potassium: 4.2 mmol/L (ref 3.5–5.3)
Sodium: 138 mmol/L (ref 135–146)
Total Bilirubin: 0.3 mg/dL (ref 0.2–1.2)
Total Protein: 7.4 g/dL (ref 6.1–8.1)
eGFR: 104 mL/min/{1.73_m2} (ref >=60)

## 2021-06-18 LAB — PROTEIN C, TOTAL: PROTEIN C, ANTIGEN: 75 % (ref 70–140)

## 2021-06-18 LAB — FIBRINOGEN

## 2021-06-18 LAB — FACTOR 5 LEIDEN: Result: NEGATIVE

## 2021-06-18 LAB — ANTITHROMBIN III: AntiThromb III Func: 154 % normal — ABNORMAL HIGH (ref 80–135)

## 2021-06-18 LAB — HOMOCYSTEINE: Homocysteine: 8.2 umol/L (ref <10.4)

## 2021-06-18 LAB — VON WILLEBRAND ANTIGEN: Von Willebrand Antigen, Plasma: 102 % (ref 50–217)

## 2021-06-18 LAB — PROTEIN S, TOTAL AND FUNCTIONAL PANEL
PROTEIN S ANTIGEN, TOTAL: 109 % normal (ref 70–140)
Protein S Activity: 86 % (ref 60–140)

## 2021-06-18 LAB — SEDIMENTATION RATE: Sed Rate: 6 mm/h (ref 0–20)

## 2021-09-18 ENCOUNTER — Encounter: Payer: Self-pay | Admitting: Sports Medicine

## 2021-09-20 ENCOUNTER — Ambulatory Visit: Payer: 59 | Admitting: Physician Assistant

## 2021-09-20 ENCOUNTER — Other Ambulatory Visit: Payer: Self-pay

## 2021-09-20 ENCOUNTER — Encounter: Payer: Self-pay | Admitting: Physician Assistant

## 2021-09-20 VITALS — BP 122/64 | HR 78 | Temp 98.5°F | Ht 64.0 in | Wt 183.0 lb

## 2021-09-20 DIAGNOSIS — R0982 Postnasal drip: Secondary | ICD-10-CM | POA: Diagnosis not present

## 2021-09-20 DIAGNOSIS — R1310 Dysphagia, unspecified: Secondary | ICD-10-CM | POA: Insufficient documentation

## 2021-09-20 DIAGNOSIS — E049 Nontoxic goiter, unspecified: Secondary | ICD-10-CM | POA: Diagnosis not present

## 2021-09-20 MED ORDER — FLUTICASONE PROPIONATE 50 MCG/ACT NA SUSP: 1.0000 | Freq: Every day | NASAL | 1 refills | Status: DC

## 2021-09-20 NOTE — Patient Instructions (Signed)
Get labs Pepcid daily Flonase daily

## 2021-09-20 NOTE — Progress Notes (Signed)
Subjective:    Patient ID: Charlotte Nelson, female    DOB: October 30, 1980, 40 y.o.   MRN: [id number]  HPI Pt is a 40 yo female who is breastfeeding and has Migraines, LPRD, allergic rhinitis who presents to the clinic for dysphagia. She is has had for quite some time but worsening over the past month or so. No choking. Family hx of thyroid issues and concerned and wants checked out. Denies any significant reflux issues. She does have ongoing PND. She is not taking anything.    .. Active Ambulatory Problems    Diagnosis Date Noted   Insulin controlled White classification A2 gestational diabetes mellitus (GDM) 07/07/2011   Annual physical exam 05/12/2015   Left nephrolithiasis 05/12/2015   Essential tremor 05/12/2015   Mood disorder ([place]) 05/12/2015   Microcytosis 05/12/2015   Dyspepsia 06/09/2015   Migraine headache 07/07/2015   Immunity status testing 05/01/2016   Not immune to hepatitis B virus 05/03/2016   LPRD (laryngopharyngeal reflux disease) 11/25/2017   Allergic conjunctivitis with xerophthalmia 01/22/2019   DDD (degenerative disc disease), lumbar 02/03/2019   Cubital tunnel syndrome, left 02/24/2019   Screening for hyperlipidemia 04/27/2019   Hair loss 05/22/2019   Palpitations 09/11/2019   Right wrist pain 11/06/2019   Perennial allergic rhinitis 11/06/2019   Dysuria 12/04/2019   Encounter for elective induction of labor 11/16/2020   Hypertension in pregnancy, preeclampsia, delivered 11/17/2020   Postpartum care following vaginal delivery 11/17/2020   Infected sebaceous cyst 06/13/2021   Superficial venous thrombosis of upper extremity, right 06/13/2021   Lactating mother 06/13/2021   Family history of DVT 06/13/2021   Family history of pulmonary embolism 06/13/2021   Thyroid enlargement 09/20/2021   Post-nasal drip 09/20/2021   Dysphagia 09/20/2021   Resolved Ambulatory Problems    Diagnosis Date Noted   Postpartum care following vaginal delivery (9/14) 07/07/2011    Thyroid nodule 07/07/2015   Viral URI 08/01/2015   Postnasal drip 09/02/2015   Bilateral ankle pain 11/04/2015   Fatigue 01/06/2016   Focal neurological deficit 03/30/2016   Laceration of skin of right hand 06/08/2016   Acute abdominal pain in right lower quadrant 06/19/2016   Lymphadenopathy, preauricular 11/25/2017   Acute cystitis 02/24/2019   Past Medical History:  Diagnosis Date   Alopecia    Anemia    Anxiety    Depression    Family history of adverse reaction to anesthesia    Gastritis    GERD (gastroesophageal reflux disease)    Gestational diabetes    Gestational diabetes mellitus, class A2 07/07/2011   Headache(784.0)    Kidney stones    OCD (obsessive compulsive disorder)    Pregnancy induced hypertension    Tremor     Review of Systems See HPI.     Objective:   Physical Exam Vitals reviewed.  Constitutional:      Appearance: Normal appearance. She is obese.  HENT:     Head: Normocephalic.     Right Ear: Tympanic membrane normal.     Left Ear: Tympanic membrane normal.     Nose: Nose normal.     Mouth/Throat:     Mouth: Mucous membranes are moist.     Pharynx: No oropharyngeal exudate or posterior oropharyngeal erythema.  Eyes:     Conjunctiva/sclera: Conjunctivae normal.  Neck:     Vascular: No carotid bruit.     Comments: Thyroid enlargement right greater than left.  Cardiovascular:     Rate and Rhythm: Normal rate and regular rhythm.  Pulses: Normal pulses.     Heart sounds: Normal heart sounds.  Pulmonary:     Effort: Pulmonary effort is normal.     Breath sounds: Normal breath sounds.  Abdominal:     General: There is no distension.     Palpations: Abdomen is soft.     Tenderness: There is no abdominal tenderness. There is no guarding.  Musculoskeletal:     Cervical back: No tenderness.  Lymphadenopathy:     Cervical: No cervical adenopathy.  Neurological:     General: No focal deficit present.     Mental Status: She is alert.           Assessment & Plan:  Marland KitchenMarland KitchenArnell was seen today for dysphagia.  Diagnoses and all orders for this visit:  Dysphagia, unspecified type -     CBC with Differential/Platelet -     COMPLETE METABOLIC PANEL WITH GFR -     Thyroid peroxidase antibody -     Thyroid Panel With TSH -     Thyroglobulin Level  Post-nasal drip -     CBC with Differential/Platelet -     COMPLETE METABOLIC PANEL WITH GFR -     fluticasone (FLONASE) 50 MCG/ACT nasal spray; Place 1 spray into both nostrils daily. -     Thyroid peroxidase antibody -     Thyroid Panel With TSH -     Thyroglobulin Level  Thyroid enlargement -     CBC with Differential/Platelet -     COMPLETE METABOLIC PANEL WITH GFR -     Thyroid peroxidase antibody -     Thyroid Panel With TSH -     Thyroglobulin Level  Unclear etiology of dysphagia.  Will check tSH and antibodies.  Thyroid feels large but had u/s a little over a year ago for that and no acute findings.  Denies any reflux but discussed trial of pepcid and PPI if continues.  PND could be giving some dysphagia feelings.  Start flonase daily. Does not want to start anti-histamine to dry up milk supply.  Follow up as needed. Consider GI referral for dysphagia and endoscopy.

## 2021-09-22 ENCOUNTER — Encounter: Payer: Self-pay | Admitting: Physician Assistant

## 2021-09-22 NOTE — Progress Notes (Signed)
Thyroid antibodies are negative.  TSH looks great.  Free T4 is perfect.  Kidney and liver look great.

## 2021-09-22 NOTE — Telephone Encounter (Signed)
B12 and folate level added via [facility].  Req #: [id number]  T. Meda Coffee, CMA

## 2021-09-25 LAB — CBC WITH DIFFERENTIAL/PLATELET
Absolute Monocytes: 433 cells/uL (ref 200–950)
Basophils Absolute: 28 cells/uL (ref 0–200)
Basophils Relative: 0.4 %
Eosinophils Absolute: 199 cells/uL (ref 15–500)
Eosinophils Relative: 2.8 %
HCT: 41.8 % (ref 35.0–45.0)
Hemoglobin: 13.6 g/dL (ref 11.7–15.5)
Lymphs Abs: 1612 cells/uL (ref 850–3900)
MCH: 25.9 pg — ABNORMAL LOW (ref 27.0–33.0)
MCHC: 32.5 g/dL (ref 32.0–36.0)
MCV: 79.5 fL — ABNORMAL LOW (ref 80.0–100.0)
MPV: 10.7 fL (ref 7.5–12.5)
Monocytes Relative: 6.1 %
Neutro Abs: 4828 cells/uL (ref 1500–7800)
Neutrophils Relative %: 68 %
Platelets: 319 10*3/uL (ref 140–400)
RBC: 5.26 10*6/uL — ABNORMAL HIGH (ref 3.80–5.10)
RDW: 13.1 % (ref 11.0–15.0)
Total Lymphocyte: 22.7 %
WBC: 7.1 10*3/uL (ref 3.8–10.8)

## 2021-09-25 LAB — COMPLETE METABOLIC PANEL WITH GFR
AG Ratio: 1.5 (calc) (ref 1.0–2.5)
ALT: 11 U/L (ref 6–29)
AST: 12 U/L (ref 10–30)
Albumin: 4.3 g/dL (ref 3.6–5.1)
Alkaline phosphatase (APISO): 85 U/L (ref 31–125)
BUN: 18 mg/dL (ref 7–25)
CO2: 25 mmol/L (ref 20–32)
Calcium: 9.4 mg/dL (ref 8.6–10.2)
Chloride: 107 mmol/L (ref 98–110)
Creat: 0.72 mg/dL (ref 0.50–0.97)
Globulin: 2.8 g/dL (calc) (ref 1.9–3.7)
Glucose, Bld: 113 mg/dL — ABNORMAL HIGH (ref 65–99)
Potassium: 4.5 mmol/L (ref 3.5–5.3)
Sodium: 139 mmol/L (ref 135–146)
Total Bilirubin: 0.4 mg/dL (ref 0.2–1.2)
Total Protein: 7.1 g/dL (ref 6.1–8.1)
eGFR: 109 mL/min/{1.73_m2} (ref >=60)

## 2021-09-25 LAB — THYROID PEROXIDASE ANTIBODY: Thyroperoxidase Ab SerPl-aCnc: <1 IU/mL (ref <9)

## 2021-09-25 LAB — THYROGLOBULIN LEVEL: Thyroglobulin: 3.1 ng/mL

## 2021-09-25 LAB — THYROID PANEL WITH TSH
Free Thyroxine Index: 2.2 (ref 1.4–3.8)
T3 Uptake: 29 % (ref 22–35)
T4, Total: 7.7 ug/dL (ref 5.1–11.9)
TSH: 1.42 mIU/L

## 2021-09-25 LAB — TEST AUTHORIZATION

## 2021-09-25 LAB — B12 AND FOLATE PANEL
Folate: 9.8 ng/mL
Vitamin B-12: 695 pg/mL (ref 200–1100)

## 2021-10-20 ENCOUNTER — Encounter: Payer: Self-pay | Admitting: Family Medicine

## 2021-10-20 ENCOUNTER — Ambulatory Visit (INDEPENDENT_AMBULATORY_CARE_PROVIDER_SITE_OTHER): Payer: 59 | Admitting: Family Medicine

## 2021-10-20 ENCOUNTER — Other Ambulatory Visit: Payer: Self-pay

## 2021-10-20 VITALS — BP 121/82 | HR 83 | Ht 64.0 in | Wt 183.0 lb

## 2021-10-20 DIAGNOSIS — J029 Acute pharyngitis, unspecified: Secondary | ICD-10-CM | POA: Diagnosis not present

## 2021-10-20 DIAGNOSIS — R052 Subacute cough: Secondary | ICD-10-CM

## 2021-10-20 DIAGNOSIS — R0982 Postnasal drip: Secondary | ICD-10-CM

## 2021-10-20 MED ORDER — PREDNISONE 20 MG PO TABS: 40.0000 mg | ORAL_TABLET | Freq: Every day | ORAL | 0 refills | Status: AC

## 2021-10-20 MED ORDER — PHENOL 1.4 % MT LIQD: 1.0000 | OROMUCOSAL | 1 refills | Status: DC | PRN

## 2021-10-20 MED ORDER — AZELASTINE HCL 0.1 % NA SOLN: 2.0000 | Freq: Two times a day (BID) | NASAL | 1 refills | Status: DC

## 2021-10-20 MED ORDER — ALBUTEROL SULFATE HFA 108 (90 BASE) MCG/ACT IN AERS: 2.0000 | INHALATION_SPRAY | Freq: Four times a day (QID) | RESPIRATORY_TRACT | 11 refills | Status: DC | PRN

## 2021-10-20 MED ORDER — FLUCONAZOLE 150 MG PO TABS: 150.0000 mg | ORAL_TABLET | Freq: Once | ORAL | 1 refills | Status: AC

## 2021-10-20 NOTE — Patient Instructions (Addendum)
Pregnancy Safe meds: Colds/Coughs/Allergies: Benadryl (alcohol free) 25 mg every 6 hours as needed Breath right strips Claritin Cepacol throat lozenges Chloraseptic throat spray Cold-Eeze- up to three times per day Cough drops, alcohol free Flonase (by prescription only) Guaifenesin Mucinex Robitussin DM (plain only, alcohol free) Saline nasal spray/drops Sudafed (pseudoephedrine) & Actifed ** use only after [redacted] weeks gestation and if you do not have high blood pressure Tylenol Vicks Vaporub Zinc lozenges Zyrtec

## 2021-10-20 NOTE — Progress Notes (Signed)
Acute Office Visit  Subjective:    Patient ID: Charlotte Nelson, female    DOB: 07-30-1981, 40 y.o.   MRN: [id number]  Chief Complaint  Patient presents with   Sinusitis     Patient is in today for sore throat and cough.  Patient had been feeling bad for the entire month of December. On 12/21 she did a virtual visit and was started on Augmentin for a sinus infection. She does feel like that cleared up her mucus (though caused a yeast infection). However, she continues with an occasional irritating cough and significant sore throat, primarily with swallowing. She has not gotten any improvement with advil or salt water gargles. She continues to have occasional coughing fits which cause brief shortness of breath and worsen her sore throat. She denies any chest pain, wheezing, nausea, vomiting, diarrhea, sinus pressure.       Past Medical History:  Diagnosis Date   Alopecia    Anemia    Anxiety    Depression    Family history of adverse reaction to anesthesia    PATERNAL GRANDFATHER HAD UNKNOWN PROBLEM  MANY YRS AGO - "IT WASN'T A SERIOUS PROBLEM"   Gastritis    GERD (gastroesophageal reflux disease)    Gestational diabetes    metformin and lantus insulin   Gestational diabetes mellitus, class A2 07/07/2011   does not normally have diabetes   Headache(784.0)    migraine   Hypertension in pregnancy, preeclampsia, delivered 11/17/2020   Kidney stones    OCD (obsessive compulsive disorder)    Pregnancy induced hypertension    nifedipine   Tremor    "familial tremor"    Past Surgical History:  Procedure Laterality Date   CYSTOSCOPY W/ URETERAL STENT PLACEMENT Left 09/22/2015   Procedure: CYSTOSCOPY WITH LEFT  RETROGRADE PYELOGRAM/ STENT PLACEMENT;  Surgeon: Carolan Clines, MD;  Location: [facility];  Service: Urology;  Laterality: Left;   LITHOTRIPSY     MOUTH SURGERY     wisdom teeth and gum x2    Family History  Problem Relation Age of Onset   Migraines Mother     Thyroid disease Mother    Hypertension Mother    Miscarriages / [place] Mother    Hyperlipidemia Mother    Urolithiasis Father    Hypertension Father    Diabetes Father    Nephrolithiasis Father    Thrombosis Father    Tremor Father    Deep vein thrombosis Father    Stroke Maternal Grandmother    Cancer Maternal Grandmother        lung ca   COPD Maternal Grandmother    Heart disease Maternal Grandmother    Diabetes Maternal Grandmother    Depression Maternal Grandmother    Alcohol abuse Maternal Grandfather    Heart disease Maternal Grandfather    Stroke Maternal Grandfather    Pulmonary embolism Maternal Grandfather    Stroke Paternal Grandmother    Diabetes Paternal Grandmother    Tremor Paternal Grandmother    Heart disease Paternal Grandfather    Diabetes Paternal Grandfather    Parkinson's disease Paternal Grandfather     Social History   Socioeconomic History   Marital status: Married    Spouse name: Not on file   Number of children: Not on file   Years of education: Not on file   Highest education level: Not on file  Occupational History   Not on file  Tobacco Use   Smoking status: Never   Smokeless tobacco: Never  Vaping Use   Vaping Use: Never used  Substance and Sexual Activity   Alcohol use: Not Currently    Comment: occasional   Drug use: No   Sexual activity: Yes    Birth control/protection: Pill  Other Topics Concern   Not on file  Social History Narrative   Not on file   Social Determinants of Health   Financial Resource Strain: Not on file  Food Insecurity: Not on file  Transportation Needs: Not on file  Physical Activity: Not on file  Stress: Not on file  Social Connections: Not on file  Intimate Partner Violence: Not on file    Outpatient Medications Prior to Visit  Medication Sig Dispense Refill   BioGaia Probiotic (BIOGAIA/GERBER SOOTHE) LIQD Take 5 drops by mouth daily at 8 pm.     fluticasone (FLONASE) 50 MCG/ACT nasal  spray Place 1 spray into both nostrils daily. 9.9 mL 1   magnesium 30 MG tablet Take 30 mg by mouth daily.     Multiple Vitamin (MULTIVITAMIN) tablet Take 1 tablet by mouth daily.     Omega-3 Fatty Acids (FISH OIL PO) Take by mouth.     VITAMIN D PO Take 5,000 Units by mouth daily.     No facility-administered medications prior to visit.    Allergies  Allergen Reactions   Citalopram Hydrobromide Itching    Pt states that this medication causes prolonged itching.....lasting for about a month.   Cymbalta [Duloxetine Hcl]    Sulfa Antibiotics Other (See Comments)   Viibryd [Vilazodone Hcl]     Tremor worsening.     Review of Systems All review of systems negative except what is listed in the HPI     Objective:    Physical Exam Vitals reviewed.  Constitutional:      Appearance: Normal appearance.  HENT:     Head: Normocephalic and atraumatic.     Right Ear: Tympanic membrane normal.     Left Ear: Tympanic membrane normal.     Nose: Nose normal.     Mouth/Throat:     Mouth: Mucous membranes are moist.     Pharynx: Oropharynx is clear. Posterior oropharyngeal erythema present.  Eyes:     Extraocular Movements: Extraocular movements intact.     Conjunctiva/sclera: Conjunctivae normal.  Cardiovascular:     Rate and Rhythm: Normal rate and regular rhythm.     Heart sounds: Normal heart sounds.  Pulmonary:     Effort: Pulmonary effort is normal.     Breath sounds: Normal breath sounds. No wheezing, rhonchi or rales.  Musculoskeletal:     Cervical back: Normal range of motion and neck supple. No tenderness.  Lymphadenopathy:     Cervical: No cervical adenopathy.  Skin:    General: Skin is warm and dry.     Findings: No rash.  Neurological:     General: No focal deficit present.     Mental Status: She is alert and oriented to person, place, and time.  Psychiatric:        Mood and Affect: Mood normal.        Thought Content: Thought content normal.        Judgment:  Judgment normal.    BP 121/82    Pulse 83    Ht _0  (1.626 m)    Wt 183 lb (83 kg)    SpO2 97%    Breastfeeding Yes    BMI 31.41 kg/m  Wt Readings from Last 3 Encounters:  10/20/21 183  lb (83 kg)  09/20/21 183 lb (83 kg)  06/09/21 184 lb (83.5 kg)    Health Maintenance Due  Topic Date Due   PAP SMEAR-Modifier  10/22/2018    There are no preventive care reminders to display for this patient.   Lab Results  Component Value Date   TSH 1.42 09/20/2021   Lab Results  Component Value Date   WBC 7.1 09/20/2021   HGB 13.6 09/20/2021   HCT 41.8 09/20/2021   MCV 79.5 (L) 09/20/2021   PLT 319 09/20/2021   Lab Results  Component Value Date   NA 139 09/20/2021   K 4.5 09/20/2021   CO2 25 09/20/2021   GLUCOSE 113 (H) 09/20/2021   BUN 18 09/20/2021   CREATININE 0.72 09/20/2021   BILITOT 0.4 09/20/2021   ALKPHOS 100 11/18/2020   AST 12 09/20/2021   ALT 11 09/20/2021   PROT 7.1 09/20/2021   ALBUMIN 2.2 (L) 11/18/2020   CALCIUM 9.4 09/20/2021   ANIONGAP 10 11/18/2020   EGFR 109 09/20/2021   Lab Results  Component Value Date   CHOL 178 04/28/2019   Lab Results  Component Value Date   HDL 61 04/28/2019   Lab Results  Component Value Date   LDLCALC 99 04/28/2019   Lab Results  Component Value Date   TRIG 86 04/28/2019   Lab Results  Component Value Date   CHOLHDL 2.9 04/28/2019   Lab Results  Component Value Date   HGBA1C 5.7 (H) 09/11/2019       Assessment & Plan:   1. Sore throat 2. Subacute cough Bacterial infection seems to be mostly resolved, but continues with cough and severe sore throat/inflammation primarily with swallowing. Will try to add a short prednisone burst - aware that she should hold breastfeeding for 4 hours after dose. Adding chloraseptic spray and albuterol inhaler as well for cough and sore throat. She is aware of lactation safe meds. She prefers to minimize oral antihistamines so she doesn't drop her milk supply.   - predniSONE  (DELTASONE) 20 MG tablet; Take 2 tablets (40 mg total) by mouth daily with breakfast for 5 days.  Dispense: 10 tablet; Refill: 0 - phenol (CHLORASEPTIC) 1.4 % LIQD; Use as directed 1 spray in the mouth or throat as needed for throat irritation / pain.  Dispense: 177 mL; Refill: 1 - albuterol (VENTOLIN HFA) 108 (90 Base) MCG/ACT inhaler; Inhale 2 puffs into the lungs every 6 (six) hours as needed for wheezing.  Dispense: 2 each; Refill: 11  3. Post-nasal drip - azelastine (ASTELIN) 0.1 % nasal spray; Place 2 sprays into both nostrils 2 (two) times daily. Use in each nostril as directed  Dispense: 301 mL; Refill: 1   Patient aware of signs/symptoms requiring further/urgent evaluation.   Follow-up if symptoms worsen or fail to improve.   Purcell Nails Olevia Bowens, DNP, FNP-C

## 2021-11-08 ENCOUNTER — Other Ambulatory Visit: Payer: Self-pay

## 2021-11-08 ENCOUNTER — Ambulatory Visit: Payer: 59 | Attending: Obstetrics and Gynecology

## 2021-11-08 DIAGNOSIS — R279 Unspecified lack of coordination: Secondary | ICD-10-CM | POA: Insufficient documentation

## 2021-11-08 DIAGNOSIS — M62838 Other muscle spasm: Secondary | ICD-10-CM | POA: Diagnosis not present

## 2021-11-08 DIAGNOSIS — M6281 Muscle weakness (generalized): Secondary | ICD-10-CM | POA: Insufficient documentation

## 2021-11-08 DIAGNOSIS — N819 Female genital prolapse, unspecified: Secondary | ICD-10-CM | POA: Diagnosis present

## 2021-11-08 DIAGNOSIS — R293 Abnormal posture: Secondary | ICD-10-CM | POA: Diagnosis not present

## 2021-11-08 DIAGNOSIS — M533 Sacrococcygeal disorders, not elsewhere classified: Secondary | ICD-10-CM | POA: Insufficient documentation

## 2021-11-08 DIAGNOSIS — M545 Low back pain, unspecified: Secondary | ICD-10-CM | POA: Insufficient documentation

## 2021-11-08 NOTE — Therapy (Signed)
[facility] @ [place], [place], [address] Phone: [phone]   Fax:  [phone]  Physical Therapy Evaluation  Patient Details  Name: Charlotte Nelson MRN: [id number] Date of Birth: 11/19/1980 Referring Provider (PT): Brien Few MD   Encounter Date: 11/08/2021   PT End of Session - 11/08/21 1650     Visit Number 1    Date for PT Re-Evaluation 01/31/22    Authorization Type UHC    PT Start Time 1194    PT Stop Time 1740    PT Time Calculation (min) 46 min    Activity Tolerance Patient tolerated treatment well    Behavior During Therapy WFL for tasks assessed/performed             Past Medical History:  Diagnosis Date   Alopecia    Anemia    Anxiety    Depression    Family history of adverse reaction to anesthesia    PATERNAL GRANDFATHER HAD UNKNOWN PROBLEM  MANY YRS AGO - "IT WASN'T A SERIOUS PROBLEM"   Gastritis    GERD (gastroesophageal reflux disease)    Gestational diabetes    metformin and lantus insulin   Gestational diabetes mellitus, class A2 07/07/2011   does not normally have diabetes   Headache(784.0)    migraine   Hypertension in pregnancy, preeclampsia, delivered 11/17/2020   Kidney stones    OCD (obsessive compulsive disorder)    Pregnancy induced hypertension    nifedipine   Tremor    "familial tremor"    Past Surgical History:  Procedure Laterality Date   CYSTOSCOPY W/ URETERAL STENT PLACEMENT Left 09/22/2015   Procedure: CYSTOSCOPY WITH LEFT  RETROGRADE PYELOGRAM/ STENT PLACEMENT;  Surgeon: Carolan Clines, MD;  Location: [facility];  Service: Urology;  Laterality: Left;   LITHOTRIPSY     MOUTH SURGERY     wisdom teeth and gum x2    There were no vitals filed for this visit.    Subjective Assessment - 11/08/21 1530     Subjective Pt returning to pelvic floor PT due coccydynia that happens when she makes certain movements while sitting. She has not been performing any  previous pelvic floor PT exercise program. She feels like overall she is doing better ,but would like to continue decreasing pain. She is sitll having incontinence, but around 50% of the week and no more fecal incontinence. She feels like she is having superior abdominal bulge. She performed strict bowel/bladder retraining program which was very helpful with bowel/bladder dysfunction.    Pertinent History G4P4    Limitations Sitting;Walking;Standing    How long can you sit comfortably? no limitation, positional and depends on movement    How long can you stand comfortably? 15 minutes    How long can you walk comfortably? no limit to length of walking, but certain movements with walking will increase pain    Patient Stated Goals decrease pain and improve urinary incontinence    Currently in Pain? Yes    Pain Score 3     Pain Location Coccyx   Will also hurt in LB/Rt sacroiliac pain   Pain Orientation Right;Left    Pain Descriptors / Indicators Stabbing;Sharp    Pain Type Chronic pain    Pain Radiating Towards Pain will radiate down Rt thigh halfway posteriorly    Pain Onset More than a month ago    Pain Frequency Intermittent    Aggravating Factors  Sitting, prolonged standing    Multiple  Pain Sites No                [facility] PT Assessment - 11/08/21 0001       Assessment   Medical Diagnosis N81.9 (ICD-10-CM) - Female genital prolapse, unspecified    Referring Provider (PT) Brien Few MD    Onset Date/Surgical Date 11/18/20    Next MD Visit none scheduled    Prior Therapy yes      Precautions   Precautions None      Restrictions   Weight Bearing Restrictions No      Balance Screen   Has the patient fallen in the past 6 months No    Has the patient had a decrease in activity level because of a fear of falling?  No    Is the patient reluctant to leave their home because of a fear of falling?  No      Home Ecologist residence      Prior  Function   Level of Independence Independent    Vocation Full time employment      Cognition   Overall Cognitive Status Within Functional Limits for tasks assessed      Posture/Postural Control   Posture Comments Elevated Rt iliac crest with Rt pelvic rotation      ROM / Strength   AROM / PROM / Strength AROM;Strength      AROM   Overall AROM Comments Lumbar: Extension reduced by 50% with Rt LBP, Bil side bend reduced by 25%, Bil rotation reduced by 25%; Bil hip A/ROM WNL      Strength   Overall Strength Comments Bil hip strength grossly 4/5; Rt LBP/SI pain withresisted hip abduction on Rt.      Palpation   Palpation comment Tenderness and rstriction throughout Rt posterolateral hip muscles decreased rib mobility; sternocostal angle>90 degrees                 No emotional/communication barriers or cognitive limitation. Patient is motivated to learn. Patient understands and agrees with treatment goals and plan. PT explains patient will be examined in standing, sitting, and lying down to see how their muscles and joints work. When they are ready, they will be asked to remove their underwear so PT can examine their perineum. The patient is also given the option of providing their own chaperone as one is not provided in our facility. The patient also has the right and is explained the right to defer or refuse any part of the evaluation or treatment including the internal exam. With the patient's consent, PT will use one gloved finger to gently assess the muscles of the pelvic floor, seeing how well it contracts and relaxes and if there is muscle symmetry. After, the patient will get dressed and PT and patient will discuss exam findings and plan of care. PT and patient discuss plan of care, schedule, attendance policy and HEP activities.        Objective measurements completed on examination: See above findings.     Pelvic Floor Special Questions - 11/08/21 0001     Prior  Pelvic/Prostate Exam Yes    Are you Pregnant or attempting pregnancy? No    Prior Pregnancies Yes    Number of Pregnancies 4    Number of C-Sections 0    Number of Vaginal Deliveries 4   with one   Episiotomy Performed Yes    Diastasis Recti 3 finger width separation at umbilicus - significant weakness demosntrated  by difficulty lifting head/shoulders any amount with abdominal doming    Currently Sexually Active Yes    Is this Painful No   she will occasionally feel discomfort before ovulation deep   History of sexually transmitted disease No    Urinary Leakage Yes    How often every other day    Pad use yes, 1 a day    Activities that cause leaking Coughing;Sneezing;Lifting;Laughing;Walking    Urinary urgency No    Urinary frequency every 2-3 hours   no nocturia   Fecal incontinence No    Fluid intake 25-40oz a day    Caffeine beverages yes    Falling out feeling (prolapse) Yes    External Perineal Exam yes    Skin Integrity Intact    Scar Episiotomy   Restricted   Perineal Body/Introitus  Normal    External Palpation WNL    Prolapse Anterior Wall   Grade 2   Pelvic Floor Internal Exam Pt identity confirmed and verbal consent provided to perform internal pelvic exam    Exam Type Vaginal    Sensation WNL    Palpation some tenderness and restriction over perineal scar tissue; superficial pelvic floor muscles WNL; significant restriction throughout Bil deep pelvic floor with pt reported pain upon palpation    Strength weak squeeze, no lift   poor coordination wit htendency to bear down   Strength # of reps 6    Strength # of seconds 4    Tone high              [facility] Adult PT Treatment/Exercise - 11/08/21 0001       Self-Care   Self-Care Other Self-Care Comments   pelvic floor muscle wand education for home use     Neuro Re-ed    Neuro Re-ed Details  Diaphragmatic breathing for pelvic floor muscle relaxation training                     PT Education -  11/08/21 1650     Education Details Pt education performed on pelvic floor anatomy, pelvic floor muscle wand, and initial HEP. Access Code: [email or web address]    Person(s) Educated Patient    Methods Explanation;Demonstration;Tactile cues;Verbal cues;Handout    Comprehension Verbalized understanding              PT Short Term Goals - 11/08/21 1639       PT SHORT TERM GOAL #1   Title Pt will be indpendent with initial HEP.    Time 4    Period Weeks    Status New    Target Date 12/06/21      PT SHORT TERM GOAL #2   Title Pt will demonstrate diaphragmatic breathing with appropriate rib and abdominal mobility in order to incrase pelvic floor A/ROM and relaxation.    Time 4    Period Weeks    Status New    Target Date 12/06/21               PT Long Term Goals - 11/08/21 1647       PT LONG TERM GOAL #1   Title Pt will be independent with advanced HEP in 12 weeks.    Time 12    Period Weeks    Status New    Target Date 01/31/22      PT LONG TERM GOAL #2   Title Pt will demonstrate 4/5 pelvic floor muscle strength wit happropriate coordination and relaxation in order to decrease  urinary incontinence and provide lumbopelvic stability.    Period Weeks    Status New    Target Date 01/31/22      PT LONG TERM GOAL #3   Title Pt will report no more than one episode of coccydynia/LBP a week with pain levels not exceeding 1/10 in order to perform work and functional activities without difficulty.    Time 12    Period Weeks    Status New    Target Date 01/31/22                    Plan - 11/08/21 1621     Clinical Impression Statement Pt is a 41 year old female G82P4 with chief complain of coccydynia, LBP/Rt SI pain, and urinary incontinence for the last year. Exam fidnings notable for decrease Bil hip strength, tenderness and restrictoin in Rt gluteals, reduced lumbar A/ROM with discomfort, core weakness and diastasis recti 3 finger width separation, pelvic floor  weakness 2/5 with poor coordination and tendency to bear down, decreased pelvic floor endurance, grade two anterior vaignal wall laxity, and tenderness/muscle spasm in deep pelvic floor muscles. Signs and symptoms are most decreased lumbopelvic motor control and core weakness that has caused deep pelvic floor tension and associated pain. Iinitial treatment included diaphragmatic breathing and instructions on use of pelvic floor wand (pt already has); she tolerated well demonstrated by no increase in pain and good teach back. She will benefit from skilled PT intervention in order to address impairments decrease pain, and improve urinary incontinence.    Personal Factors and Comorbidities Comorbidity 1    Comorbidities G4P4    Examination-Activity Limitations Bend;Lift;Continence;Stand;Sit    Examination-Participation Restrictions Interpersonal Relationship;Community Activity    Stability/Clinical Decision Making Stable/Uncomplicated    Clinical Decision Making Low    Rehab Potential Good    PT Frequency Biweekly    PT Duration 12 weeks    PT Treatment/Interventions ADLs/Self Care Home Management;Biofeedback;Cryotherapy;Electrical Stimulation;Moist Heat;Therapeutic activities;Therapeutic exercise;Neuromuscular re-education;Manual techniques;Patient/family education;Scar mobilization;Passive range of motion;Dry needling;Spinal Manipulations    PT Next Visit Plan Manual techniques to help reduce pelvic floor muscle tension and improve relaxation coordination; begin gentle core strengthening to improve lumbopelvic stability.    PT Home Exercise Plan Access Code: [email or web address]    Consulted and Agree with Plan of Care Patient             Patient will benefit from skilled therapeutic intervention in order to improve the following deficits and impairments:  Decreased coordination, Decreased range of motion, Impaired tone, Increased fascial restricitons, Decreased endurance, Increased muscle spasms, Pain,  Decreased activity tolerance, Decreased scar mobility, Hypomobility, Postural dysfunction, Decreased mobility, Decreased strength  Visit Diagnosis: Other muscle spasm  Muscle weakness (generalized)  Unspecified lack of coordination  Abnormal posture     Problem List Patient Active Problem List   Diagnosis Date Noted   Thyroid enlargement 09/20/2021   Post-nasal drip 09/20/2021   Dysphagia 09/20/2021   Infected sebaceous cyst 06/13/2021   Superficial venous thrombosis of upper extremity, right 06/13/2021   Lactating mother 06/13/2021   Family history of DVT 06/13/2021   Family history of pulmonary embolism 06/13/2021   Hypertension in pregnancy, preeclampsia, delivered 11/17/2020   Postpartum care following vaginal delivery 11/17/2020   Encounter for elective induction of labor 11/16/2020   Dysuria 12/04/2019   Right wrist pain 11/06/2019   Perennial allergic rhinitis 11/06/2019   Palpitations 09/11/2019   Hair loss 05/22/2019   Screening for hyperlipidemia 04/27/2019   Cubital tunnel  syndrome, left 02/24/2019   DDD (degenerative disc disease), lumbar 02/03/2019   Allergic conjunctivitis with xerophthalmia 01/22/2019   LPRD (laryngopharyngeal reflux disease) 11/25/2017   Not immune to hepatitis B virus 05/03/2016   Immunity status testing 05/01/2016   Migraine headache 07/07/2015   Dyspepsia 06/09/2015   Annual physical exam 05/12/2015   Left nephrolithiasis 05/12/2015   Essential tremor 05/12/2015   Mood disorder ([place]) 05/12/2015   Microcytosis 05/12/2015   Insulin controlled White classification A2 gestational diabetes mellitus (GDM) 07/07/2011    Londen Roberts, PT, DPT01/18/234:53 PM   [facility] @ [place], [place], [address] Phone: [phone]   Fax:  [phone]  Name: Charlotte Nelson MRN: [id number] Date of Birth: 06-29-1981

## 2021-11-08 NOTE — Patient Instructions (Signed)
Pelvic floor muscle wand: Clean before and after each use Use lubricant! Insert the pointy side into vagina Make sure the wand is far enough inside - up to the second curve Avoid tail being straight up or down To release the right side, make sure the tail of the wand is over to the left You know that you are releasing the right spots based on feeling similar soreness as to what we do in our internal pelvic release You can do this lying down propped on pillows with legs relaxed; OR standing in the shower with the leg of the side youre releasing propped on the tub or small stool Start with 5-10 minutes - can progress time if you are not too sore afterwards Work up to every other day in order to get initial pain down, then start performing less often  Access Code: [email or web address] URL: [email or web address] Date: 11/08/2021 Prepared by: Emerly Roberts  Exercises Supine Diaphragmatic Breathing - 5 x daily - 7 x weekly - 1 sets - 5-10 reps

## 2021-11-30 ENCOUNTER — Other Ambulatory Visit: Payer: Self-pay

## 2021-11-30 ENCOUNTER — Ambulatory Visit: Payer: 59 | Attending: Obstetrics and Gynecology

## 2021-11-30 DIAGNOSIS — M62838 Other muscle spasm: Secondary | ICD-10-CM | POA: Diagnosis not present

## 2021-11-30 DIAGNOSIS — R293 Abnormal posture: Secondary | ICD-10-CM | POA: Diagnosis present

## 2021-11-30 DIAGNOSIS — M6281 Muscle weakness (generalized): Secondary | ICD-10-CM | POA: Diagnosis present

## 2021-11-30 DIAGNOSIS — R279 Unspecified lack of coordination: Secondary | ICD-10-CM | POA: Insufficient documentation

## 2021-11-30 NOTE — Therapy (Signed)
[facility] @ [place], [place], [address] Phone: [phone]   Fax:  [phone]  Physical Therapy Treatment  Patient Details  Name: Charlotte Nelson MRN: [id number] Date of Birth: [birth year] Referring Provider (PT): Brien Few MD   Encounter Date: 11/30/2021   PT End of Session - 11/30/21 1148     Visit Number 2    Date for PT Re-Evaluation 01/31/22    Authorization Type UHC    PT Start Time 1147    PT Stop Time 1227    PT Time Calculation (min) 40 min    Activity Tolerance Patient tolerated treatment well    Behavior During Therapy WFL for tasks assessed/performed             Past Medical History:  Diagnosis Date   Alopecia    Anemia    Anxiety    Depression    Family history of adverse reaction to anesthesia    PATERNAL GRANDFATHER HAD UNKNOWN PROBLEM  MANY YRS AGO - "IT WASN'T A SERIOUS PROBLEM"   Gastritis    GERD (gastroesophageal reflux disease)    Gestational diabetes    metformin and lantus insulin   Gestational diabetes mellitus, class A2 07/07/2011   does not normally have diabetes   Headache(784.0)    migraine   Hypertension in pregnancy, preeclampsia, delivered 11/17/2020   Kidney stones    OCD (obsessive compulsive disorder)    Pregnancy induced hypertension    nifedipine   Tremor    "familial tremor"    Past Surgical History:  Procedure Laterality Date   CYSTOSCOPY W/ URETERAL STENT PLACEMENT Left 09/22/2015   Procedure: CYSTOSCOPY WITH LEFT  RETROGRADE PYELOGRAM/ STENT PLACEMENT;  Surgeon: Carolan Clines, MD;  Location: [facility];  Service: Urology;  Laterality: Left;   LITHOTRIPSY     MOUTH SURGERY     wisdom teeth and gum x2    There were no vitals filed for this visit.   Subjective Assessment - 11/30/21 1149     Subjective Pt states that she currently has UTI. She is having to wear heavy lead covers and notices increase in tailbone pain wtih occasional Rt  hip/posterior leg pain. She denies having had any leaking since last visit. She has not been performing any exercises due to time constraints.    Patient Stated Goals decrease pain and improve urinary incontinence    Currently in Pain? No/denies    Multiple Pain Sites No                               [facility] Adult PT Treatment/Exercise - 11/30/21 0001       Neuro Re-ed    Neuro Re-ed Details  Transversus abdominus training with incorporation into other exercises      Exercises   Exercises Lumbar      Lumbar Exercises: Stretches   Active Hamstring Stretch Right;Left;1 rep;60 seconds   sitting   Piriformis Stretch Right;Left;1 rep;60 seconds      Lumbar Exercises: Supine   Dead Bug 20 reps    Bridge with [employer] 20 reps    Other Supine Lumbar Exercises Leg extensions with appropriate core activation 10x Bil    Other Supine Lumbar Exercises Happy baby 10 breaths withrelaxation breathing      Lumbar Exercises: Sidelying   Clam 20 reps;Both    Other Sidelying Lumbar Exercises 2 x 10 reverse clam shell  Lumbar Exercises: Quadruped   Madcat/Old Horse 20 reps    Other Quadruped Lumbar Exercises Child's pose 10 breaths with relaxation breathing                       PT Short Term Goals - 11/08/21 1639       PT SHORT TERM GOAL #1   Title Pt will be indpendent with initial HEP.    Time 4    Period Weeks    Status New    Target Date 12/06/21      PT SHORT TERM GOAL #2   Title Pt will demonstrate diaphragmatic breathing with appropriate rib and abdominal mobility in order to incrase pelvic floor A/ROM and relaxation.    Time 4    Period Weeks    Status New    Target Date 12/06/21               PT Long Term Goals - 11/08/21 1647       PT LONG TERM GOAL #1   Title Pt will be independent with advanced HEP in 12 weeks.    Time 12    Period Weeks    Status New    Target Date 01/31/22      PT LONG TERM GOAL #2   Title Pt will  demonstrate 4/5 pelvic floor muscle strength wit happropriate coordination and relaxation in order to decrease urinary incontinence and provide lumbopelvic stability.    Period Weeks    Status New    Target Date 01/31/22      PT LONG TERM GOAL #3   Title Pt will report no more than one episode of coccydynia/LBP a week with pain levels not exceeding 1/10 in order to perform work and functional activities without difficulty.    Time 12    Period Weeks    Status New    Target Date 01/31/22                   Plan - 11/30/21 1207     Clinical Impression Statement Pt has seen increase in symptoms with work changes and not being able to perofrm any of HEP. Focus of this treatment session placed on stretches and exercises to help encourage her to work on HEP at home and review what she should be doing. She did very well with all exercises, but did have mild increases in pain with bridges and deadbug - this was decreased with appropriate breathing and multimodal cues for core activation utilizing transversus abdominus. She will ocntinue to benefit from skilled PT intervention in order to progress functional strengthening, perform manual techniques for pain management, and work towards goal completion.    PT Treatment/Interventions ADLs/Self Care Home Management;Biofeedback;Cryotherapy;Electrical Stimulation;Moist Heat;Therapeutic activities;Therapeutic exercise;Neuromuscular re-education;Manual techniques;Patient/family education;Scar mobilization;Passive range of motion;Dry needling;Spinal Manipulations    PT Next Visit Plan Consider return to rectal or vaginal manual techniques in order to reduce tension causing low back pain; progress core/hip strengthening and mobility.    PT Home Exercise Plan Access Code: [email or web address]    Consulted and Agree with Plan of Care Patient             Patient will benefit from skilled therapeutic intervention in order to improve the following deficits and  impairments:  Decreased coordination, Decreased range of motion, Impaired tone, Increased fascial restricitons, Decreased endurance, Increased muscle spasms, Pain, Decreased activity tolerance, Decreased scar mobility, Hypomobility, Postural dysfunction, Decreased mobility, Decreased strength  Visit Diagnosis: Other muscle  spasm  Muscle weakness (generalized)  Unspecified lack of coordination  Abnormal posture     Problem List Patient Active Problem List   Diagnosis Date Noted   Thyroid enlargement 09/20/2021   Post-nasal drip 09/20/2021   Dysphagia 09/20/2021   Infected sebaceous cyst 06/13/2021   Superficial venous thrombosis of upper extremity, right 06/13/2021   Lactating mother 06/13/2021   Family history of DVT 06/13/2021   Family history of pulmonary embolism 06/13/2021   Hypertension in pregnancy, preeclampsia, delivered 11/17/2020   Postpartum care following vaginal delivery 11/17/2020   Encounter for elective induction of labor 11/16/2020   Dysuria 12/04/2019   Right wrist pain 11/06/2019   Perennial allergic rhinitis 11/06/2019   Palpitations 09/11/2019   Hair loss 05/22/2019   Screening for hyperlipidemia 04/27/2019   Cubital tunnel syndrome, left 02/24/2019   DDD (degenerative disc disease), lumbar 02/03/2019   Allergic conjunctivitis with xerophthalmia 01/22/2019   LPRD (laryngopharyngeal reflux disease) 11/25/2017   Not immune to hepatitis B virus 05/03/2016   Immunity status testing 05/01/2016   Migraine headache 07/07/2015   Dyspepsia 06/09/2015   Annual physical exam 05/12/2015   Left nephrolithiasis 05/12/2015   Essential tremor 05/12/2015   Mood disorder ([place]) 05/12/2015   Microcytosis 05/12/2015   Insulin controlled White classification A2 gestational diabetes mellitus (GDM) 07/07/2011   Munachimso Roberts, PT, DPT02/06/2311:29 PM   [facility] @ [place], [place], [address] Phone:  [phone]   Fax:  [phone]  Name: Charlotte Nelson MRN: [id number] Date of Birth: 1981-08-18

## 2021-11-30 NOTE — Patient Instructions (Signed)
Access Code: [email or web address] URL: [email or web address] Date: 11/30/2021 Prepared by: Lorien Roberts  Exercises Supine Diaphragmatic Breathing - 5 x daily - 7 x weekly - 1 sets - 5-10 reps Clamshell - 1 x daily - 7 x weekly - 3 sets - 10 reps Beginner Reverse Clamshell - 1 x daily - 7 x weekly - 3 sets - 10 reps Supine Piriformis Stretch with Foot on Ground - 1 x daily - 7 x weekly - 1 sets - 2 reps - 60 hold Seated Hamstring Stretch - 1 x daily - 7 x weekly - 1 sets - 2 reps - 60 hold Supine Transversus Abdominis Bracing with Leg Extension - 1 x daily - 7 x weekly - 2 sets - 10 reps Supine Dead Bug with Leg Extension - 1 x daily - 7 x weekly - 2 sets - 10 reps Cat Cow - 1 x daily - 7 x weekly - 2 sets - 10 reps Happy Baby with Pelvic Floor Lengthening - 1 x daily - 7 x weekly - 1 sets - 10 reps

## 2021-12-14 ENCOUNTER — Ambulatory Visit: Payer: 59

## 2021-12-28 ENCOUNTER — Other Ambulatory Visit: Payer: Self-pay

## 2021-12-28 ENCOUNTER — Ambulatory Visit: Payer: 59 | Attending: Obstetrics and Gynecology

## 2021-12-28 DIAGNOSIS — R293 Abnormal posture: Secondary | ICD-10-CM | POA: Diagnosis present

## 2021-12-28 DIAGNOSIS — M6281 Muscle weakness (generalized): Secondary | ICD-10-CM | POA: Insufficient documentation

## 2021-12-28 DIAGNOSIS — R279 Unspecified lack of coordination: Secondary | ICD-10-CM | POA: Diagnosis present

## 2021-12-28 DIAGNOSIS — M62838 Other muscle spasm: Secondary | ICD-10-CM | POA: Insufficient documentation

## 2021-12-28 NOTE — Therapy (Signed)
[facility] @ [address] [place], [place], [address] Phone: [phone]   Fax:  [phone]  Physical Therapy Treatment  Patient Details  Name: Charlotte Nelson MRN: [id number] Date of Birth: 02-08-1981 Referring Provider (PT): Brien Few MD   Encounter Date: 12/28/2021   PT End of Session - 12/28/21 1148     Visit Number 3    Date for PT Re-Evaluation 01/31/22    Authorization Type UHC    PT Start Time 1147    PT Stop Time 1227    PT Time Calculation (min) 40 min    Activity Tolerance Patient tolerated treatment well    Behavior During Therapy WFL for tasks assessed/performed             Past Medical History:  Diagnosis Date   Alopecia    Anemia    Anxiety    Depression    Family history of adverse reaction to anesthesia    PATERNAL GRANDFATHER HAD UNKNOWN PROBLEM  MANY YRS AGO - "IT WASN'T A SERIOUS PROBLEM"   Gastritis    GERD (gastroesophageal reflux disease)    Gestational diabetes    metformin and lantus insulin   Gestational diabetes mellitus, class A2 07/07/2011   does not normally have diabetes   Headache(784.0)    migraine   Hypertension in pregnancy, preeclampsia, delivered 11/17/2020   Kidney stones    OCD (obsessive compulsive disorder)    Pregnancy induced hypertension    nifedipine   Tremor    "familial tremor"    Past Surgical History:  Procedure Laterality Date   CYSTOSCOPY W/ URETERAL STENT PLACEMENT Left 09/22/2015   Procedure: CYSTOSCOPY WITH LEFT  RETROGRADE PYELOGRAM/ STENT PLACEMENT;  Surgeon: Carolan Clines, MD;  Location: [facility];  Service: Urology;  Laterality: Left;   LITHOTRIPSY     MOUTH SURGERY     wisdom teeth and gum x2    There were no vitals filed for this visit.   Subjective Assessment - 12/28/21 1149     Subjective Pt states that she is having more tailbone pain again the last couple of days. She does feel like incontinence is largely improved and will  surprise herself by how long she has not thought about it. She forgot to use panty liner several days last week and did not have any issues.    Patient Stated Goals decrease pain and improve urinary incontinence    Currently in Pain? No/denies    Multiple Pain Sites No                  No emotional/communication barriers or cognitive limitation. Patient is motivated to learn. Patient understands and agrees with treatment goals and plan. PT explains patient will be examined in standing, sitting, and lying down to see how their muscles and joints work. When they are ready, they will be asked to remove their underwear so PT can examine their perineum. The patient is also given the option of providing their own chaperone as one is not provided in our facility. The patient also has the right and is explained the right to defer or refuse any part of the evaluation or treatment including the internal exam. With the patient's consent, PT will use one gloved finger to gently assess the muscles of the pelvic floor, seeing how well it contracts and relaxes and if there is muscle symmetry. After, the patient will get dressed and PT and patient will discuss exam findings and plan of  care. PT and patient discuss plan of care, schedule, attendance policy and HEP activities.              [facility] Adult PT Treatment/Exercise - 12/28/21 0001       Self-Care   Self-Care Other Self-Care Comments   Discussed most important HEP at length and strong encouragement to make time for exercises and stretches     Manual Therapy   Manual Therapy Soft tissue mobilization;Joint mobilization;Internal Pelvic Floor    Joint Mobilization coccyx mobilizatoin externally in prone    Soft tissue mobilization Bil gluteals/piriformis, lateral sacral/coccyx borders    Internal Pelvic Floor Bil levator ani                     PT Education - 12/28/21 1154     Education Details Pt education perofrmed on  improtance of regular stretches and exercises.    Person(s) Educated Patient    Methods Explanation;Demonstration;Tactile cues;Verbal cues;Handout    Comprehension Verbalized understanding              PT Short Term Goals - 12/28/21 1232       PT SHORT TERM GOAL #1   Title Pt will be indpendent with initial HEP.    Time 4    Period Weeks    Status Partially Met    Target Date 12/06/21      PT SHORT TERM GOAL #2   Title Pt will demonstrate diaphragmatic breathing with appropriate rib and abdominal mobility in order to incrase pelvic floor A/ROM and relaxation.    Time 4    Period Weeks    Status Partially Met    Target Date 12/06/21               PT Long Term Goals - 12/28/21 1232       PT LONG TERM GOAL #1   Title Pt will be independent with advanced HEP in 12 weeks.    Time 12    Period Weeks    Status Partially Met    Target Date 01/31/22      PT LONG TERM GOAL #2   Title Pt will demonstrate 4/5 pelvic floor muscle strength with appropriate coordination and relaxation in order to decrease urinary incontinence and provide lumbopelvic stability.    Period Weeks    Status Partially Met    Target Date 01/31/22      PT LONG TERM GOAL #3   Title Pt will report no more than one episode of coccydynia/LBP a week with pain levels not exceeding 1/10 in order to perform work and functional activities without difficulty.    Time 12    Period Weeks    Status Partially Met    Target Date 01/31/22                   Plan - 12/28/21 1154     Clinical Impression Statement Pt having more tailbone pain in the last week but improvement in incontinence. She has not been able to work on any exericses; we discussed importance of working on stretches and exercises where she can. Internal manual techniques demonstrated some bil pubococcygeus tension, which is not surprising given increase in tailbone pain; good tolerance to release techniques. Extenral coccyx  mobilizations performed with soft tissue mobilization to surrounding gluteals/piriformis and sacral/coccyx borders. She reported no pain or discomfort wit hany position at end of session. She will continue to benefit from skilled PT intervention in order to progress  functional strengthening, perform manual techniques for pain management, and work towards goal completion.    PT Treatment/Interventions ADLs/Self Care Home Management;Biofeedback;Cryotherapy;Electrical Stimulation;Moist Heat;Therapeutic activities;Therapeutic exercise;Neuromuscular re-education;Manual techniques;Patient/family education;Scar mobilization;Passive range of motion;Dry needling;Spinal Manipulations    PT Next Visit Plan Consider return to rectal or vaginal manual techniques in order to reduce tension causing low back pain; progress core/hip strengthening and mobility.    PT Home Exercise Plan Access Code: [email or web address]    Consulted and Agree with Plan of Care Patient             Patient will benefit from skilled therapeutic intervention in order to improve the following deficits and impairments:  Decreased coordination, Decreased range of motion, Impaired tone, Increased fascial restricitons, Decreased endurance, Increased muscle spasms, Pain, Decreased activity tolerance, Decreased scar mobility, Hypomobility, Postural dysfunction, Decreased mobility, Decreased strength  Visit Diagnosis: Other muscle spasm  Muscle weakness (generalized)  Unspecified lack of coordination  Abnormal posture     Problem List Patient Active Problem List   Diagnosis Date Noted   Thyroid enlargement 09/20/2021   Post-nasal drip 09/20/2021   Dysphagia 09/20/2021   Infected sebaceous cyst 06/13/2021   Superficial venous thrombosis of upper extremity, right 06/13/2021   Lactating mother 06/13/2021   Family history of DVT 06/13/2021   Family history of pulmonary embolism 06/13/2021   Hypertension in pregnancy, preeclampsia, delivered  11/17/2020   Postpartum care following vaginal delivery 11/17/2020   Encounter for elective induction of labor 11/16/2020   Dysuria 12/04/2019   Right wrist pain 11/06/2019   Perennial allergic rhinitis 11/06/2019   Palpitations 09/11/2019   Hair loss 05/22/2019   Screening for hyperlipidemia 04/27/2019   Cubital tunnel syndrome, left 02/24/2019   DDD (degenerative disc disease), lumbar 02/03/2019   Allergic conjunctivitis with xerophthalmia 01/22/2019   LPRD (laryngopharyngeal reflux disease) 11/25/2017   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

## 2022-01-10 ENCOUNTER — Other Ambulatory Visit: Payer: Self-pay

## 2022-01-10 ENCOUNTER — Ambulatory Visit: Payer: 59

## 2022-01-10 DIAGNOSIS — M62838 Other muscle spasm: Secondary | ICD-10-CM | POA: Diagnosis not present

## 2022-01-10 DIAGNOSIS — R293 Abnormal posture: Secondary | ICD-10-CM

## 2022-01-10 DIAGNOSIS — R279 Unspecified lack of coordination: Secondary | ICD-10-CM

## 2022-01-10 DIAGNOSIS — M6281 Muscle weakness (generalized): Secondary | ICD-10-CM

## 2022-01-10 NOTE — Therapy (Signed)
[facility] ?[facility] @ [facility] ?[place], [place], [address] ?Phone: [phone]   Fax:  [phone] ? ?Physical Therapy Treatment ? ?Patient Details  ?Name: Charlotte Nelson ?MRN: [id number] ?Date of Birth: 06-15-1981 ?Referring Provider (PT): Brien Few MD ? ? ?Encounter Date: 01/10/2022 ? ? PT End of Session - 01/10/22 1620   ? ? Visit Number 4   ? Date for PT Re-Evaluation 01/31/22   ? Authorization Type UHC   ? PT Start Time [phone]   ? PT Stop Time 8502   ? PT Time Calculation (min) 38 min   ? Activity Tolerance Patient tolerated treatment well   ? Behavior During Therapy [facility] for tasks assessed/performed   ? ?  ?  ? ?  ? ? ?Past Medical History:  ?Diagnosis Date  ? Alopecia   ? Anemia   ? Anxiety   ? Depression   ? Family history of adverse reaction to anesthesia   ? PATERNAL GRANDFATHER HAD UNKNOWN PROBLEM  MANY YRS AGO - "IT WASN'T A SERIOUS PROBLEM"  ? Gastritis   ? GERD (gastroesophageal reflux disease)   ? Gestational diabetes   ? metformin and lantus insulin  ? Gestational diabetes mellitus, class A2 07/07/2011  ? does not normally have diabetes  ? Headache(784.0)   ? migraine  ? Hypertension in pregnancy, preeclampsia, delivered 11/17/2020  ? Kidney stones   ? OCD (obsessive compulsive disorder)   ? Pregnancy induced hypertension   ? nifedipine  ? Tremor   ? "familial tremor"  ? ? ?Past Surgical History:  ?Procedure Laterality Date  ? CYSTOSCOPY W/ URETERAL STENT PLACEMENT Left 09/22/2015  ? Procedure: CYSTOSCOPY WITH LEFT  RETROGRADE PYELOGRAM/ STENT PLACEMENT;  Surgeon: Carolan Clines, MD;  Location: [facility];  Service: Urology;  Laterality: Left;  ? LITHOTRIPSY    ? MOUTH SURGERY    ? wisdom teeth and gum x2  ? ? ?There were no vitals filed for this visit. ? ? Subjective Assessment - 01/10/22 1629   ? ? Subjective Pt states that she has not had any significant tailbone pain and sciatic pain since last visit. She also has not had any urinary incontinence.    ? Patient Stated Goals decrease pain and improve urinary incontinence   ? Currently in Pain? No/denies   ? Multiple Pain Sites No   ? ?  ?  ? ?  ? ? ? ? ? ?No emotional/communication barriers or cognitive limitation. Patient is motivated to learn. Patient understands and agrees with treatment goals and plan. PT explains patient will be examined in standing, sitting, and lying down to see how their muscles and joints work. When they are ready, they will be asked to remove their underwear so PT can examine their perineum. The patient is also given the option of providing their own chaperone as one is not provided in our facility. The patient also has the right and is explained the right to defer or refuse any part of the evaluation or treatment including the internal exam. With the patient's consent, PT will use one gloved finger to gently assess the muscles of the pelvic floor, seeing how well it contracts and relaxes and if there is muscle symmetry. After, the patient will get dressed and PT and patient will discuss exam findings and plan of care. PT and patient discuss plan of care, schedule, attendance policy and HEP activities. ? ? ? ? ? ? ? ? ? ? ? ? ? ? ?  [facility] Adult PT Treatment/Exercise - 01/10/22 0001   ? ?  ? Neuro Re-ed   ? Neuro Re-ed Details  Transversus abdominus training with incorporation into other exercises   ?  ? Lumbar Exercises: Stretches  ? Piriformis Stretch Right;Left;1 rep;60 seconds   ?  ? Lumbar Exercises: Standing  ? Functional Squats 20 reps   ? Shoulder Extension Strengthening;Both;15 reps;Theraband   ? Theraband Level (Shoulder Extension) Level 3 (Green)   ? Other Standing Lumbar Exercises 3-way kick 10x ea, bil   ?  ? Lumbar Exercises: Supine  ? Dead Bug 20 reps   ? Bridge with March --   ? Other Supine Lumbar Exercises supine march 2 x 10; leg extensions with appropriate core activation 10x Bil   ?  ? Lumbar Exercises: Sidelying  ? Clam --   ? Other Sidelying Lumbar Exercises --   ?   ? Lumbar Exercises: Quadruped  ? Madcat/Old Horse 20 reps   ? Other Quadruped Lumbar Exercises Fire hydrant 2 x 10 bil; donkey kicks 2 x 10 bil   ? ?  ?  ? ?  ? ? ? ? ? ? ? ? ? ? PT Education - 01/10/22 1632   ? ? Education Details Pt education performed on importance of strengthening program.   ? Person(s) Educated Patient   ? Methods Explanation;Demonstration;Tactile cues;Verbal cues;Handout   ? Comprehension Verbalized understanding   ? ?  ?  ? ?  ? ? ? PT Short Term Goals - 12/28/21 1232   ? ?  ? PT SHORT TERM GOAL #1  ? Title Pt will be indpendent with initial HEP.   ? Time 4   ? Period Weeks   ? Status Partially Met   ? Target Date 12/06/21   ?  ? PT SHORT TERM GOAL #2  ? Title Pt will demonstrate diaphragmatic breathing with appropriate rib and abdominal mobility in order to incrase pelvic floor A/ROM and relaxation.   ? Time 4   ? Period Weeks   ? Status Partially Met   ? Target Date 12/06/21   ? ?  ?  ? ?  ? ? ? ? PT Long Term Goals - 12/28/21 1232   ? ?  ? PT LONG TERM GOAL #1  ? Title Pt will be independent with advanced HEP in 12 weeks.   ? Time 12   ? Period Weeks   ? Status Partially Met   ? Target Date 01/31/22   ?  ? PT LONG TERM GOAL #2  ? Title Pt will demonstrate 4/5 pelvic floor muscle strength with appropriate coordination and relaxation in order to decrease urinary incontinence and provide lumbopelvic stability.   ? Period Weeks   ? Status Partially Met   ? Target Date 01/31/22   ?  ? PT LONG TERM GOAL #3  ? Title Pt will report no more than one episode of coccydynia/LBP a week with pain levels not exceeding 1/10 in order to perform work and functional activities without difficulty.   ? Time 12   ? Period Weeks   ? Status Partially Met   ? Target Date 01/31/22   ? ?  ?  ? ?  ? ? ? ? ? ? ? ? Plan - 01/10/22 1632   ? ? Clinical Impression Statement Pt is doing very well, but with attempt at strengthening progressions, but she did have some coccyx pain when perofrming supine exercises that also  contributed to increase in  Rt sciatica. We tried to find strengthening positions that did not aggravate pain, but in almost all positions that were initially aggravating, if she found good core contraction, but decreased notably. Very good tolerance to standing exercises with appropriate challenge and no pain. She will continue to benefit from skilled PT intervention in order to progress functional strengthening, perform manual techniques for pain management, and work towards goal completion.   ? PT Frequency 1x / week   ? PT Duration 12 weeks   ? PT Treatment/Interventions ADLs/Self Care Home Management;Biofeedback;Cryotherapy;Electrical Stimulation;Moist Heat;Therapeutic activities;Therapeutic exercise;Neuromuscular re-education;Manual techniques;Patient/family education;Scar mobilization;Passive range of motion;Dry needling;Spinal Manipulations   ? PT Next Visit Plan Continue strengthening progressions to tolerance; manual techniques as needed.   ? PT Home Exercise Plan Access Code: [email or web address]   ? Consulted and Agree with Plan of Care Patient   ? ?  ?  ? ?  ? ? ?Patient will benefit from skilled therapeutic intervention in order to improve the following deficits and impairments:  Decreased coordination, Decreased range of motion, Impaired tone, Increased fascial restricitons, Decreased endurance, Increased muscle spasms, Pain, Decreased activity tolerance, Decreased scar mobility, Hypomobility, Postural dysfunction, Decreased mobility, Decreased strength ? ?Visit Diagnosis: ?Other muscle spasm ? ?Muscle weakness (generalized) ? ?Unspecified lack of coordination ? ?Abnormal posture ? ? ? ? ?Problem List ?Patient Active Problem List  ? Diagnosis Date Noted  ? Thyroid enlargement 09/20/2021  ? Post-nasal drip 09/20/2021  ? Dysphagia 09/20/2021  ? Infected sebaceous cyst 06/13/2021  ? Superficial venous thrombosis of upper extremity, right 06/13/2021  ? Lactating mother 06/13/2021  ? Family history of DVT 06/13/2021  ?  Family history of pulmonary embolism 06/13/2021  ? Hypertension in pregnancy, preeclampsia, delivered 11/17/2020  ? Postpartum care following vaginal delivery 11/17/2020  ? Encounter for elective ind

## 2022-01-25 ENCOUNTER — Ambulatory Visit: Payer: 59 | Attending: Obstetrics and Gynecology

## 2022-01-25 DIAGNOSIS — M6281 Muscle weakness (generalized): Secondary | ICD-10-CM | POA: Insufficient documentation

## 2022-01-25 DIAGNOSIS — R279 Unspecified lack of coordination: Secondary | ICD-10-CM | POA: Insufficient documentation

## 2022-01-25 DIAGNOSIS — R293 Abnormal posture: Secondary | ICD-10-CM | POA: Insufficient documentation

## 2022-01-25 DIAGNOSIS — M62838 Other muscle spasm: Secondary | ICD-10-CM | POA: Insufficient documentation

## 2022-01-25 NOTE — Therapy (Signed)
[facility] ?[place], [place], [address] ?Phone: [phone]   Fax:  [phone] ? ?Physical Therapy Treatment ? ?Patient Details  ?Name: Charlotte Nelson ?MRN: [id number] ?Date of Birth: 07-29-1981 ?Referring Provider (PT): Brien Few MD ? ? ?Encounter Date: 01/25/2022 ? ? PT End of Session - 01/25/22 0805   ? ? Visit Number 5   ? Date for PT Re-Evaluation 01/31/22   ? Authorization Type UHC   ? PT Start Time [phone]   ? PT Stop Time [phone]   ? PT Time Calculation (min) 38 min   ? Activity Tolerance Patient tolerated treatment well   ? Behavior During Therapy [facility] for tasks assessed/performed   ? ?  ?  ? ?  ? ? ?Past Medical History:  ?Diagnosis Date  ? Alopecia   ? Anemia   ? Anxiety   ? Depression   ? Family history of adverse reaction to anesthesia   ? PATERNAL GRANDFATHER HAD UNKNOWN PROBLEM  MANY YRS AGO - "IT WASN'T A SERIOUS PROBLEM"  ? Gastritis   ? GERD (gastroesophageal reflux disease)   ? Gestational diabetes   ? metformin and lantus insulin  ? Gestational diabetes mellitus, class A2 07/07/2011  ? does not normally have diabetes  ? Headache(784.0)   ? migraine  ? Hypertension in pregnancy, preeclampsia, delivered 11/17/2020  ? Kidney stones   ? OCD (obsessive compulsive disorder)   ? Pregnancy induced hypertension   ? nifedipine  ? Tremor   ? "familial tremor"  ? ? ?Past Surgical History:  ?Procedure Laterality Date  ? CYSTOSCOPY W/ URETERAL STENT PLACEMENT Left 09/22/2015  ? Procedure: CYSTOSCOPY WITH LEFT  RETROGRADE PYELOGRAM/ STENT PLACEMENT;  Surgeon: Carolan Clines, MD;  Location: [facility];  Service: Urology;  Laterality: Left;  ? LITHOTRIPSY    ? MOUTH SURGERY    ? wisdom teeth and gum x2  ? ? ?There were no vitals filed for this visit. ? ? Subjective Assessment - 01/25/22 0805   ? ? Subjective Pt states that she has not had any pain in the last 2 weeks. She states that they all had stomach flu and she still did not have increase in  pain.   ? Patient Stated Goals decrease pain and improve urinary incontinence   ? Currently in Pain? No/denies   ? Multiple Pain Sites No   ? ?  ?  ? ?  ? ? ? ? ? ? ? ? ? ? ? ? ? ? ? ? ? ? ? ? [facility] Adult PT Treatment/Exercise - 01/25/22 0001   ? ?  ? Lumbar Exercises: Standing  ? Functional Squats 20 reps   with band pull aparts for increased core activation, green  ? Shoulder Extension Strengthening;Both;15 reps;Theraband   ? Theraband Level (Shoulder Extension) Level 3 (Green)   ? Other Standing Lumbar Exercises 3-way kick 10x ea, bil; pallof press green band 10x bil   ?  ? Lumbar Exercises: Seated  ? Other Seated Lumbar Exercises Swiss ball routine: cirlces 20x bil, lateral glides 20x, pelivc tilts 20x, thoracic rotation x10, marching 2 x 10, LAQ 10x bil   ? ?  ?  ? ?  ? ? ? ? ? ? ? ? ? ? PT Education - 01/25/22 0808   ? ? Education Details Exercise progressions.   ? Person(s) Educated Patient   ? Methods Explanation;Demonstration;Tactile cues;Verbal cues;Handout   ? Comprehension Verbalized understanding   ? ?  ?  ? ?  ? ? ?  PT Short Term Goals - 12/28/21 1232   ? ?  ? PT SHORT TERM GOAL #1  ? Title Pt will be indpendent with initial HEP.   ? Time 4   ? Period Weeks   ? Status Partially Met   ? Target Date 12/06/21   ?  ? PT SHORT TERM GOAL #2  ? Title Pt will demonstrate diaphragmatic breathing with appropriate rib and abdominal mobility in order to incrase pelvic floor A/ROM and relaxation.   ? Time 4   ? Period Weeks   ? Status Partially Met   ? Target Date 12/06/21   ? ?  ?  ? ?  ? ? ? ? PT Long Term Goals - 12/28/21 1232   ? ?  ? PT LONG TERM GOAL #1  ? Title Pt will be independent with advanced HEP in 12 weeks.   ? Time 12   ? Period Weeks   ? Status Partially Met   ? Target Date 01/31/22   ?  ? PT LONG TERM GOAL #2  ? Title Pt will demonstrate 4/5 pelvic floor muscle strength with appropriate coordination and relaxation in order to decrease urinary incontinence and provide lumbopelvic stability.   ?  Period Weeks   ? Status Partially Met   ? Target Date 01/31/22   ?  ? PT LONG TERM GOAL #3  ? Title Pt will report no more than one episode of coccydynia/LBP a week with pain levels not exceeding 1/10 in order to perform work and functional activities without difficulty.   ? Time 12   ? Period Weeks   ? Status Partially Met   ? Target Date 01/31/22   ? ?  ?  ? ?  ? ? ? ? ? ? ? ? Plan - 01/25/22 1219   ? ? Clinical Impression Statement Pt doing well with pain reduction demosntrated by no issues over the last two weeks. Swiss ball exercises introduced to help improve mobility and core strength. She did very well with allother standing progressions demonstrated by appropriate challenge and good form. She will continue to benefit from skilled PT intervention in order to progress functional strengthening, perform manual techniques for pain management, and work towards goal completion.   ? PT Treatment/Interventions ADLs/Self Care Home Management;Biofeedback;Cryotherapy;Electrical Stimulation;Moist Heat;Therapeutic activities;Therapeutic exercise;Neuromuscular re-education;Manual techniques;Patient/family education;Scar mobilization;Passive range of motion;Dry needling;Spinal Manipulations   ? PT Next Visit Plan Continue strengthening progressions to tolerance; manual techniques as needed.   ? PT Home Exercise Plan Access Code: [email or web address]   ? Consulted and Agree with Plan of Care Patient   ? ?  ?  ? ?  ? ? ?Patient will benefit from skilled therapeutic intervention in order to improve the following deficits and impairments:  Decreased coordination, Decreased range of motion, Impaired tone, Increased fascial restricitons, Decreased endurance, Increased muscle spasms, Pain, Decreased activity tolerance, Decreased scar mobility, Hypomobility, Postural dysfunction, Decreased mobility, Decreased strength ? ?Visit Diagnosis: ?Other muscle spasm ? ?Muscle weakness (generalized) ? ?Unspecified lack of coordination ? ?Abnormal  posture ? ? ? ? ?Problem List ?Patient Active Problem List  ? Diagnosis Date Noted  ? Thyroid enlargement 09/20/2021  ? Post-nasal drip 09/20/2021  ? Dysphagia 09/20/2021  ? Infected sebaceous cyst 06/13/2021  ? Superficial venous thrombosis of upper extremity, right 06/13/2021  ? Lactating mother 06/13/2021  ? Family history of DVT 06/13/2021  ? Family history of pulmonary embolism 06/13/2021  ? Hypertension in pregnancy, preeclampsia, delivered 11/17/2020  ? Postpartum care following  vaginal delivery 11/17/2020  ? Encounter for elective induction of labor 11/16/2020  ? Dysuria 12/04/2019  ? Right wrist pain 11/06/2019  ? Perennial allergic rhinitis 11/06/2019  ? Palpitations 09/11/2019  ? Hair loss 05/22/2019  ? Screening for hyperlipidemia 04/27/2019  ? Cubital tunnel syndrome, left 02/24/2019  ? DDD (degenerative disc disease), lumbar 02/03/2019  ? Allergic conjunctivitis with xerophthalmia 01/22/2019  ? LPRD (laryngopharyngeal reflux disease) 11/25/2017  ? Not immune to hepatitis B virus 05/03/2016  ? Immunity status testing 05/01/2016  ? Migraine headache 07/07/2015  ? Dyspepsia 06/09/2015  ? Annual physical exam 05/12/2015  ? Left nephrolithiasis 05/12/2015  ? Essential tremor 05/12/2015  ? Mood disorder ([place]) 05/12/2015  ? Microcytosis 05/12/2015  ? Insulin controlled White classification A2 gestational diabetes mellitus (GDM) 07/07/2011  ? ? ?Kinzly Roberts, PT, DPT04/06/238:43 AM ? ? ?[facility] ?[facility] ?[place], [place], [address] ?Phone: [phone]   Fax:  [phone] ? ?Name: Mckynlee Marinello ?MRN: [id number] ?Date of Birth: [birth year] ? ? ? ?

## 2022-01-29 ENCOUNTER — Other Ambulatory Visit: Payer: Self-pay | Admitting: Urology

## 2022-01-30 ENCOUNTER — Ambulatory Visit: Payer: 59

## 2022-02-07 ENCOUNTER — Ambulatory Visit: Payer: 59

## 2022-02-07 DIAGNOSIS — M62838 Other muscle spasm: Secondary | ICD-10-CM

## 2022-02-07 DIAGNOSIS — M6281 Muscle weakness (generalized): Secondary | ICD-10-CM

## 2022-02-07 DIAGNOSIS — R279 Unspecified lack of coordination: Secondary | ICD-10-CM

## 2022-02-07 DIAGNOSIS — R293 Abnormal posture: Secondary | ICD-10-CM

## 2022-02-07 NOTE — Therapy (Signed)
[facility] ?[facility] ?[place], [place], [address] ?Phone: [phone]   Fax:  [phone] ? ?Physical Therapy Treatment ? ?Patient Details  ?Name: Charlotte Nelson ?MRN: [id number] ?Date of Birth: [birth year] ?Referring Provider (PT): Brien Few MD ? ? ?Encounter Date: 02/07/2022 ? ? PT End of Session - 02/07/22 0802   ? ? Visit Number 6   ? Date for PT Re-Evaluation 01/31/22   ? Authorization Type UHC   ? PT Start Time 0801   ? PT Stop Time 0841   ? PT Time Calculation (min) 40 min   ? Activity Tolerance Patient tolerated treatment well   ? Behavior During Therapy [facility] for tasks assessed/performed   ? ?  ?  ? ?  ? ? ?Past Medical History:  ?Diagnosis Date  ? Alopecia   ? Anemia   ? Anxiety   ? Depression   ? Family history of adverse reaction to anesthesia   ? PATERNAL GRANDFATHER HAD UNKNOWN PROBLEM  MANY YRS AGO - "IT WASN'T A SERIOUS PROBLEM"  ? Gastritis   ? GERD (gastroesophageal reflux disease)   ? Gestational diabetes   ? metformin and lantus insulin  ? Gestational diabetes mellitus, class A2 07/07/2011  ? does not normally have diabetes  ? Headache(784.0)   ? migraine  ? Hypertension in pregnancy, preeclampsia, delivered 11/17/2020  ? Kidney stones   ? OCD (obsessive compulsive disorder)   ? Pregnancy induced hypertension   ? nifedipine  ? Tremor   ? "familial tremor"  ? ? ?Past Surgical History:  ?Procedure Laterality Date  ? CYSTOSCOPY W/ URETERAL STENT PLACEMENT Left 09/22/2015  ? Procedure: CYSTOSCOPY WITH LEFT  RETROGRADE PYELOGRAM/ STENT PLACEMENT;  Surgeon: Carolan Clines, MD;  Location: [facility];  Service: Urology;  Laterality: Left;  ? LITHOTRIPSY    ? MOUTH SURGERY    ? wisdom teeth and gum x2  ? ? ?There were no vitals filed for this visit. ? ? Subjective Assessment - 02/07/22 0802   ? ? Subjective Pt states that pain is overall very good. She will notice mild pain when she is holding baby and shifting weight/rotating.   ? Currently in  Pain? No/denies   ? Multiple Pain Sites No   ? ?  ?  ? ?  ? ? ? ? ? ? ? ? ? ? ? ? ? ? ? ? ? ? ? ? [facility] Adult PT Treatment/Exercise - 02/07/22 0001   ? ?  ? Lumbar Exercises: Standing  ? Row Strengthening;20 reps;Theraband;Both   ? Theraband Level (Row) Level 3 (Green)   ? Shoulder Extension Strengthening;20 reps;Both;Theraband   ? Theraband Level (Shoulder Extension) Level 3 (Green)   ? Other Standing Lumbar Exercises 3-way kick 10x ea, bil; pallof press green band 10x bil; side stepping red band 2 laps; marching on airex 3 x 10; SL RDLs 10x bil; anterior weight lift 2 x 10 5 lbs   ? ?  ?  ? ?  ? ? ? ? ? ? ? ? ? ? PT Education - 02/07/22 0815   ? ? Education Details Educatio non exercise progressions and importance of strengthening.   ? Person(s) Educated Patient   ? Methods Explanation;Demonstration;Tactile cues;Verbal cues;Handout   ? Comprehension Verbalized understanding   ? ?  ?  ? ?  ? ? ? PT Short Term Goals - 02/07/22 0831   ? ?  ? PT SHORT TERM GOAL #1  ? Title Pt will  be indpendent with initial HEP.   ? Time 4   ? Period Weeks   ? Status Achieved   ? Target Date 12/06/21   ?  ? PT SHORT TERM GOAL #2  ? Title Pt will demonstrate diaphragmatic breathing with appropriate rib and abdominal mobility in order to incrase pelvic floor A/ROM and relaxation.   ? Time 4   ? Period Weeks   ? Status Achieved   ? Target Date 12/06/21   ? ?  ?  ? ?  ? ? ? ? PT Long Term Goals - 02/07/22 0832   ? ?  ? PT LONG TERM GOAL #1  ? Title Pt will be independent with advanced HEP in 12 weeks.   ? Time 12   ? Period Weeks   ? Status Partially Met   ? Target Date 01/31/22   ?  ? PT LONG TERM GOAL #2  ? Title Pt will demonstrate 4/5 pelvic floor muscle strength with appropriate coordination and relaxation in order to decrease urinary incontinence and provide lumbopelvic stability.   ? Period Weeks   ? Status Partially Met   ? Target Date 01/31/22   ?  ? PT LONG TERM GOAL #3  ? Title Pt will report no more than one episode of  coccydynia/LBP a week with pain levels not exceeding 1/10 in order to perform work and functional activities without difficulty.   ? Time 12   ? Period Weeks   ? Status Partially Met   ? Target Date 01/31/22   ? ?  ?  ? ?  ? ? ? ? ? ? ? ? Plan - 02/07/22 0815   ? ? Clinical Impression Statement Pt is overal ldoing very well and having minimal pain. Believe the pain that she is still having is during activities that are requiring increased core stability that she does not have the strength to support herself through. Dueto this, she was encouragedto work on strengthening as part of HEP and not just stretching. She tolerated all strengthening exercises well today with appropriate challenge. She will continue to benefit from skilled PT intervention in order to progress functional strengthening, perform manual techniques for pain management, and work towards goal completion.   ? PT Treatment/Interventions ADLs/Self Care Home Management;Biofeedback;Cryotherapy;Electrical Stimulation;Moist Heat;Therapeutic activities;Therapeutic exercise;Neuromuscular re-education;Manual techniques;Patient/family education;Scar mobilization;Passive range of motion;Dry needling;Spinal Manipulations   ? PT Next Visit Plan Continue strengthening progressions to tolerance; manual techniques as needed.   ? PT Home Exercise Plan Access Code: [email or web address]   ? Consulted and Agree with Plan of Care Patient   ? ?  ?  ? ?  ? ? ?Patient will benefit from skilled therapeutic intervention in order to improve the following deficits and impairments:  Decreased coordination, Decreased range of motion, Impaired tone, Increased fascial restricitons, Decreased endurance, Increased muscle spasms, Pain, Decreased activity tolerance, Decreased scar mobility, Hypomobility, Postural dysfunction, Decreased mobility, Decreased strength ? ?Visit Diagnosis: ?Other muscle spasm ? ?Muscle weakness (generalized) ? ?Unspecified lack of coordination ? ?Abnormal  posture ? ? ? ? ?Problem List ?Patient Active Problem List  ? Diagnosis Date Noted  ? Thyroid enlargement 09/20/2021  ? Post-nasal drip 09/20/2021  ? Dysphagia 09/20/2021  ? Infected sebaceous cyst 06/13/2021  ? Superficial venous thrombosis of upper extremity, right 06/13/2021  ? Lactating mother 06/13/2021  ? Family history of DVT 06/13/2021  ? Family history of pulmonary embolism 06/13/2021  ? Hypertension in pregnancy, preeclampsia, delivered 11/17/2020  ? Postpartum care following vaginal delivery  11/17/2020  ? Encounter for elective induction of labor 11/16/2020  ? Dysuria 12/04/2019  ? Right wrist pain 11/06/2019  ? Perennial allergic rhinitis 11/06/2019  ? Palpitations 09/11/2019  ? Hair loss 05/22/2019  ? Screening for hyperlipidemia 04/27/2019  ? Cubital tunnel syndrome, left 02/24/2019  ? DDD (degenerative disc disease), lumbar 02/03/2019  ? Allergic conjunctivitis with xerophthalmia 01/22/2019  ? LPRD (laryngopharyngeal reflux disease) 11/25/2017  ? Not immune to hepatitis B virus 05/03/2016  ? Immunity status testing 05/01/2016  ? Migraine headache 07/07/2015  ? Dyspepsia 06/09/2015  ? Annual physical exam 05/12/2015  ? Left nephrolithiasis 05/12/2015  ? Essential tremor 05/12/2015  ? Mood disorder ([place]) 05/12/2015  ? Microcytosis 05/12/2015  ? Insulin controlled White classification A2 gestational diabetes mellitus (GDM) 07/07/2011  ? ? ?Karlie Roberts, PT, DPT04/19/238:41 AM ? ? ?[facility] ?[facility] ?[address], [place], [address] ?Phone: [phone]   Fax:  [phone] ? ?Name: Charlotte Nelson ?MRN: [id number] ?Date of Birth: 10/19/1981 ? ? ? ?

## 2022-02-13 ENCOUNTER — Ambulatory Visit: Payer: 59

## 2022-02-13 DIAGNOSIS — R293 Abnormal posture: Secondary | ICD-10-CM

## 2022-02-13 DIAGNOSIS — M62838 Other muscle spasm: Secondary | ICD-10-CM

## 2022-02-13 DIAGNOSIS — M6281 Muscle weakness (generalized): Secondary | ICD-10-CM

## 2022-02-13 DIAGNOSIS — R279 Unspecified lack of coordination: Secondary | ICD-10-CM

## 2022-02-13 NOTE — Therapy (Signed)
?OUTPATIENT PHYSICAL THERAPY FEMALE PELVIC EVALUATION ? ? ?Patient Name: Charlotte Nelson ?MRN: [id number] ?DOB:1981/04/21, 41 y.o., female ?Today's Date: 02/13/2022 ? ? PT End of Session - 02/13/22 1148   ? ? Visit Number 7   ? Date for PT Re-Evaluation 05/08/22   ? Authorization Type UHC   ? PT Start Time 1148   ? PT Stop Time 1226   ? PT Time Calculation (min) 38 min   ? Activity Tolerance Patient tolerated treatment well   ? Behavior During Therapy [facility] for tasks assessed/performed   ? ?  ?  ? ?  ? ? ?Past Medical History:  ?Diagnosis Date  ? Alopecia   ? Anemia   ? Anxiety   ? Depression   ? Family history of adverse reaction to anesthesia   ? PATERNAL GRANDFATHER HAD UNKNOWN PROBLEM  MANY YRS AGO - "IT WASN'T A SERIOUS PROBLEM"  ? Gastritis   ? GERD (gastroesophageal reflux disease)   ? Gestational diabetes   ? metformin and lantus insulin  ? Gestational diabetes mellitus, class A2 07/07/2011  ? does not normally have diabetes  ? Headache(784.0)   ? migraine  ? Hypertension in pregnancy, preeclampsia, delivered 11/17/2020  ? Kidney stones   ? OCD (obsessive compulsive disorder)   ? Pregnancy induced hypertension   ? nifedipine  ? Tremor   ? "familial tremor"  ? ?Past Surgical History:  ?Procedure Laterality Date  ? CYSTOSCOPY W/ URETERAL STENT PLACEMENT Left 09/22/2015  ? Procedure: CYSTOSCOPY WITH LEFT  RETROGRADE PYELOGRAM/ STENT PLACEMENT;  Surgeon: Carolan Clines, MD;  Location: [facility];  Service: Urology;  Laterality: Left;  ? LITHOTRIPSY    ? MOUTH SURGERY    ? wisdom teeth and gum x2  ? ?Patient Active Problem List  ? Diagnosis Date Noted  ? Thyroid enlargement 09/20/2021  ? Post-nasal drip 09/20/2021  ? Dysphagia 09/20/2021  ? Infected sebaceous cyst 06/13/2021  ? Superficial venous thrombosis of upper extremity, right 06/13/2021  ? Lactating mother 06/13/2021  ? Family history of DVT 06/13/2021  ? Family history of pulmonary embolism 06/13/2021  ? Hypertension in pregnancy, preeclampsia, delivered  11/17/2020  ? Postpartum care following vaginal delivery 11/17/2020  ? Encounter for elective induction of labor 11/16/2020  ? Dysuria 12/04/2019  ? Right wrist pain 11/06/2019  ? Perennial allergic rhinitis 11/06/2019  ? Palpitations 09/11/2019  ? Hair loss 05/22/2019  ? Screening for hyperlipidemia 04/27/2019  ? Cubital tunnel syndrome, left 02/24/2019  ? DDD (degenerative disc disease), lumbar 02/03/2019  ? Allergic conjunctivitis with xerophthalmia 01/22/2019  ? LPRD (laryngopharyngeal reflux disease) 11/25/2017  ? Not immune to hepatitis B virus 05/03/2016  ? Immunity status testing 05/01/2016  ? Migraine headache 07/07/2015  ? Dyspepsia 06/09/2015  ? Annual physical exam 05/12/2015  ? Left nephrolithiasis 05/12/2015  ? Essential tremor 05/12/2015  ? Mood disorder ([place]) 05/12/2015  ? Microcytosis 05/12/2015  ? Insulin controlled White classification A2 gestational diabetes mellitus (GDM) 07/07/2011  ? ? ?PCP: Silverio Decamp, MD ? ?REFERRING PROVIDER: Silverio Decamp,* ? ?REFERRING DIAG: N81.9 (ICD-10-CM) - Female genital prolapse, unspecified ? ?THERAPY DIAG:  ?Other muscle spasm ? ?Muscle weakness (generalized) ? ?Unspecified lack of coordination ? ?Abnormal posture ? ?ONSET DATE: 11/18/20 ? ?SUBJECTIVE:    Pt states that she is overall doing well with no changes. She continues to deny any coccydynia or Rt sided buttock/SIJ pain. She does still have stress urinary incontinence and pelvic pressure, but not as severe as before.                                                                                                                                                                                       ? ? ?  PAIN:  ?Are you having pain? No ? ? ? ?OBJECTIVE 02/13/22: *No internal pelvic exam performed for updated findings today due to patient preference while she is on menstrual cycle.  ? ?Lumbar A/ROM: flexion WNL, extension 50% with pain in Rt buttock, bil side bend 75% with pull in low back;  75% bil rotation ? ?Squat: WNL ? ?Hip strength: Rt hip flexion/IR/ER 4/5, Lt hip flexion/IR/ER 5/5, Rt hip abduction 3/5, Lt hip adduction 4/5, Bil adduction 4/5, Rt extension 2/5 with pain, Lt extension 3/5 ? ?ASLR: (-) bil ? ?Palpation: tenderness in Rt glutes/Rt SIJ ? ? ?TODAY'S TREATMENT  ?Manual: ?Soft tissue mobilization: ?Scar tissue mobilization: ?Myofascial release: ?Spinal mobilization: ?Internal pelvic floor techniques: ?Dry needling: ?Neuromuscular re-education: ?Core retraining:  ?Core facilitation: ?Standing Wood chop with 4lb 2 x 10 bil ?Marching on airex 3 x 10 ?3-way kick 10x each bil ?Form correction: ?Pelvic floor contraction training: ?Down training: ?Exercises: ?Stretches/mobility: ?Seated/propped piriformis 60 sec bil ?Crescent lunge windmill 10x bil ?Strengthening: ?Sidestepping 2 laps red band ?Therapeutic activities: ?Functional strengthening activities: ?Self-care: ? ? ? ? ?PATIENT EDUCATION:  ?Education details: Pt education performed on exercise progressions ?Person educated: Patient ?Education method: Explanation, Demonstration, Tactile cues, Verbal cues, and Handouts ?Education comprehension: verbalized understanding ? ? ?HOME EXERCISE PROGRAM: ?[id number]  ? ?ASSESSMENT: ? ?CLINICAL IMPRESSION: ?Pt overall doing very well with large decrease in coccydynia. She is still having stress urinary incontinence and some sensation of pelvic pressure that is associated with activity - but not as intense as it used to be. She has also reported no recent Rt. SIJ pain. She has not been performing strengthening exercises at home, but only stretches. Stretches are most likely helping with reduction in pelvic floor tension and coccydynia, but we have been discussing importance of strengthening to help prolong positive changes and improve pelvic floor strength/support. We started incorporating pelvic floor strengthening into functional activities today, but discussed importance of relaxation between  contractions. She will continue to benefit from skilled PT intervention in order to progress functional strengthening, perform manual techniques for pain management, and work towards goal completion. ? ? ?OBJECTIVE IMPAIRMENTS decreased activity tolerance, decreased coordination, decreased endurance, decreased mobility, decreased ROM, decreased strength, hypomobility, increased fascial restrictions, increased muscle spasms, impaired flexibility, impaired tone, postural dysfunction, and pain.  ? ?ACTIVITY LIMITATIONS  prolonged sitting, standing, intercourse .  ? ?PERSONAL FACTORS 1 comorbidity: G4P4  are also affecting patient's functional outcome.  ? ? ?REHAB POTENTIAL: Good ? ?CLINICAL DECISION MAKING: Stable/uncomplicated ? ?EVALUATION COMPLEXITY: Low ? ? ?GOALS: ?Goals reviewed with patient? Yes ? ?SHORT TERM GOALS: Target date: 12/06/21 ? ?Pt will be independent with HEP.  ? ?Baseline: ?Goal status: MET ? ?2.  Pt will demonstrate diaphragmatic breathing with appropriate rib and abdominal mobility in order to incrase pelvic floor A/ROM and relaxation ?Baseline:  ?Goal status: MET ? ? ?LONG TERM GOALS: Target date: 01/31/22 ? ?Pt will be independent with advanced HEP.  ? ?Baseline: Pt working on being more compliant with HEP ?Goal status: IN PROGRESS ? ?2.  Pt will demonstrate 4/5 pelvic floor muscle strength with appropriate coordination and relaxation in order to decrease urinary incontinence and provide lumbopelvic stability ?Baseline: Pt has not been working on pelvic floor strengthening due to tension and pain ?Goal status: IN PROGRESS ? ?3.  Pt will report no more than one episode of coccydynia/LBP a week with pain levels not exceeding 1/10 in order to perform work and functional activities without difficulty ?Baseline:  ?Goal status: MET ? ? ?  PLAN: ?PT FREQUENCY: 1x/week ? ?PT DURATION: 12 weeks ? ?PLANNED INTERVENTIONS: Therapeutic exercises, Therapeutic activity, Neuromuscular re-education, Balance  training, Gait training, Patient/Family education, Joint mobilization, Dry Needling, Biofeedback, and Manual therapy ? ?PLAN FOR NEXT SESSION: Plan to perform internal pelvic floor assessment to reassess tension, vaginal

## 2022-02-15 NOTE — Progress Notes (Addendum)
COVID Vaccine Completed: yes x2 ?Date COVID Vaccine completed: 10/20/19, 11/11/19 ?Has received booster: ?COVID vaccine manufacturer: Pfizer     ? ?Date of COVID positive in last 90 days: no ? ?PCP - Rosalio Loud, MD ?Cardiologist - n/a ? ?Chest x-ray - n/a ?EKG - 02/16/22 Epic/chart ?Stress Test - 10 years ago per pt ?ECHO - 10/05/19 Epic ?Cardiac Cath - n/a ?Pacemaker/ICD device last checked: n/a ?Spinal Cord Stimulator: n/a ? ?Bowel Prep - no ? ?Sleep Study - na ?CPAP -  ? ?Fasting Blood Sugar - pre Dm, no checks at home ?Checks Blood Sugar _____ times a day ? ?Blood Thinner Instructions: n/a ?Aspirin Instructions: ?Last Dose: ? ?Activity level: Can go up a flight of stairs and perform activities of daily living without stopping and without symptoms of chest pain or shortness of breath. ?     ? ?Anesthesia review:  ? ?Patient denies shortness of breath, fever, cough and chest pain at PAT appointment ? ? ?Patient verbalized understanding of instructions that were given to them at the PAT appointment. Patient was also instructed that they will need to review over the PAT instructions again at home before surgery.  ?

## 2022-02-15 NOTE — Patient Instructions (Addendum)
DUE TO COVID-19 ONLY TWO VISITORS  (aged 40 and older)  ARE ALLOWED TO COME WITH YOU AND STAY IN THE WAITING ROOM ONLY DURING PRE OP AND PROCEDURE.   ?**NO VISITORS ARE ALLOWED IN THE SHORT STAY AREA OR RECOVERY ROOM!!** ? ? Your procedure is scheduled on: 02/27/22 ? ? Report to [facility] Main Entrance ? ?  Report to admitting at 12:00 PM ? ? Call this number if you have problems the morning of surgery [phone] ? ? Do not eat food :After Midnight. ? ? After Midnight you may have the following liquids until 11:15 AM DAY OF SURGERY ? ?Water ?Black Coffee (sugar ok, NO MILK/CREAM OR CREAMERS)  ?Tea (sugar ok, NO MILK/CREAM OR CREAMERS) regular and decaf                             ?Plain Jell-O (NO RED)                                           ?Fruit ices (not with fruit pulp, NO RED)                                     ?Popsicles (NO RED)                                                                  ?Juice: apple, WHITE grape, WHITE cranberry ?Sports drinks like Gatorade (NO RED) ?Clear broth(vegetable,chicken,beef) ? ?FOLLOW BOWEL PREP AND ANY ADDITIONAL PRE OP INSTRUCTIONS YOU RECEIVED FROM YOUR SURGEON'S OFFICE!!! ?  ?  ?Oral Hygiene is also important to reduce your risk of infection.                                    ?Remember - BRUSH YOUR TEETH THE MORNING OF SURGERY WITH YOUR REGULAR TOOTHPASTE ? ? Take these medicines the morning of surgery with A SIP OF WATER: None ?                  ?           You may not have any metal on your body including hair pins, jewelry, and body piercing ? ?           Do not wear make-up, lotions, powders, perfumes, or deodorant ? ?Do not wear nail polish including gel and S&S, artificial/acrylic nails, or any other type of covering on natural nails including finger and toenails. If you have artificial nails, gel coating, etc. that needs to be removed by a nail salon please have this removed prior to surgery or surgery may need to be canceled/ delayed if the  surgeon/ anesthesia feels like they are unable to be safely monitored.  ? ?Do not shave  48 hours prior to surgery.  ? ? Do not bring valuables to the hospital. [facility] NOT ?            RESPONSIBLE  FOR VALUABLES. ? ? Contacts, dentures or bridgework may not be worn into surgery. ?  ? Patients discharged on the day of surgery will not be allowed to drive home.  Someone NEEDS to stay with you for the first 24 hours after anesthesia. ? ? Special Instructions: Bring a copy of your healthcare power of attorney and living will documents         the day of surgery if you haven't scanned them before. ? ?            Please read over the following fact sheets you were given: IF [place] [phone]- Apolonio Schneiders  ? ?   [facility] - Preparing for Surgery ?Before surgery, you can play an important role.  Because skin is not sterile, your skin needs to be as free of germs as possible.  You can reduce the number of germs on your skin by washing with CHG (chlorahexidine gluconate) soap before surgery.  CHG is an antiseptic cleaner which kills germs and bonds with the skin to continue killing germs even after washing. ?Please DO NOT use if you have an allergy to CHG or antibacterial soaps.  If your skin becomes reddened/irritated stop using the CHG and inform your nurse when you arrive at Short Stay. ?Do not shave (including legs and underarms) for at least 48 hours prior to the first CHG shower.  You may shave your face/neck. ? ?Please follow these instructions carefully: ? 1.  Shower with CHG Soap the night before surgery and the  morning of surgery. ? 2.  If you choose to wash your hair, wash your hair first as usual with your normal  shampoo. ? 3.  After you shampoo, rinse your hair and body thoroughly to remove the shampoo.                            ? 4.  Use CHG as you would any other liquid soap.  You can apply chg directly to the skin and wash.  Gently with a  scrungie or clean washcloth. ? 5.  Apply the CHG Soap to your body ONLY FROM THE NECK DOWN.   Do   not use on face/ open      ?                     Wound or open sores. Avoid contact with eyes, ears mouth and   genitals (private parts).  ?                     Production manager,  Genitals (private parts) with your normal soap. ?            6.  Wash thoroughly, paying special attention to the area where your    surgery  will be performed. ? 7.  Thoroughly rinse your body with warm water from the neck down. ? 8.  DO NOT shower/wash with your normal soap after using and rinsing off the CHG Soap. ?               9.  Pat yourself dry with a clean towel. ?           10.  Wear clean pajamas. ?           11.  Place clean sheets on your bed the night of your first shower and do not  sleep with pets. ?Day of Surgery : ?Do not apply any lotions/deodorants the morning of surgery.  Please wear clean clothes to the hospital/surgery center. ? ?FAILURE TO FOLLOW THESE INSTRUCTIONS MAY RESULT IN THE CANCELLATION OF YOUR SURGERY ? ?PATIENT SIGNATURE_________________________________ ? ?NURSE SIGNATURE__________________________________ ? ?________________________________________________________________________  ?

## 2022-02-16 ENCOUNTER — Encounter (HOSPITAL_COMMUNITY): Payer: Self-pay

## 2022-02-16 ENCOUNTER — Encounter (HOSPITAL_COMMUNITY)
Admission: RE | Admit: 2022-02-16 | Discharge: 2022-02-16 | Disposition: A | Discharge status: Home / Self Care | Payer: 59 | Source: Ambulatory Visit | Attending: Urology | Admitting: Urology

## 2022-02-16 VITALS — BP 129/92 | HR 74 | Temp 98.2°F | Ht 64.0 in | Wt 170.0 lb

## 2022-02-16 DIAGNOSIS — Z01818 Encounter for other preprocedural examination: Secondary | ICD-10-CM | POA: Insufficient documentation

## 2022-02-16 DIAGNOSIS — R7303 Prediabetes: Secondary | ICD-10-CM | POA: Diagnosis not present

## 2022-02-16 DIAGNOSIS — R002 Palpitations: Secondary | ICD-10-CM | POA: Insufficient documentation

## 2022-02-16 LAB — BASIC METABOLIC PANEL WITH GFR
Anion gap: 5 (ref 5–15)
BUN: 19 mg/dL (ref 6–20)
CO2: 24 mmol/L (ref 22–32)
Calcium: 9.3 mg/dL (ref 8.9–10.3)
Chloride: 109 mmol/L (ref 98–111)
Creatinine, Ser: 0.75 mg/dL (ref 0.44–1.00)
GFR, Estimated: >60 mL/min (ref >60)
Glucose, Bld: 99 mg/dL (ref 70–99)
Potassium: 4 mmol/L (ref 3.5–5.1)
Sodium: 138 mmol/L (ref 135–145)

## 2022-02-16 LAB — CBC
HCT: 43.3 % (ref 36.0–46.0)
Hemoglobin: 14.2 g/dL (ref 12.0–15.0)
MCH: 26.1 pg (ref 26.0–34.0)
MCHC: 32.8 g/dL (ref 30.0–36.0)
MCV: 79.6 fL — ABNORMAL LOW (ref 80.0–100.0)
Platelets: 282 10*3/uL (ref 150–400)
RBC: 5.44 MIL/uL — ABNORMAL HIGH (ref 3.87–5.11)
RDW: 13.9 % (ref 11.5–15.5)
WBC: 8.6 10*3/uL (ref 4.0–10.5)
nRBC: 0 % (ref 0.0–0.2)

## 2022-02-16 LAB — HEMOGLOBIN A1C
Hgb A1c MFr Bld: 5.6 % (ref 4.8–5.6)
Mean Plasma Glucose: 114.02 mg/dL

## 2022-02-16 LAB — GLUCOSE, CAPILLARY: Glucose-Capillary: 101 mg/dL — ABNORMAL HIGH (ref 70–99)

## 2022-02-27 ENCOUNTER — Encounter (HOSPITAL_COMMUNITY): Payer: Self-pay | Admitting: Urology

## 2022-02-27 ENCOUNTER — Ambulatory Visit (HOSPITAL_COMMUNITY): Payer: 59

## 2022-02-27 ENCOUNTER — Ambulatory Visit (HOSPITAL_BASED_OUTPATIENT_CLINIC_OR_DEPARTMENT_OTHER): Payer: 59

## 2022-02-27 ENCOUNTER — Ambulatory Visit (HOSPITAL_COMMUNITY)
Admission: RE | Admit: 2022-02-27 | Discharge: 2022-02-27 | Disposition: A | Discharge status: Home / Self Care | Payer: 59 | Source: Ambulatory Visit | Attending: Urology | Admitting: Urology

## 2022-02-27 ENCOUNTER — Encounter (HOSPITAL_COMMUNITY)
Admission: RE | Disposition: A | Discharge status: Home / Self Care | Payer: Self-pay | Source: Ambulatory Visit | Attending: Urology

## 2022-02-27 DIAGNOSIS — R3121 Asymptomatic microscopic hematuria: Secondary | ICD-10-CM | POA: Insufficient documentation

## 2022-02-27 DIAGNOSIS — N200 Calculus of kidney: Secondary | ICD-10-CM | POA: Insufficient documentation

## 2022-02-27 DIAGNOSIS — K219 Gastro-esophageal reflux disease without esophagitis: Secondary | ICD-10-CM | POA: Diagnosis not present

## 2022-02-27 HISTORY — PX: HOLMIUM LASER APPLICATION: SHX5852

## 2022-02-27 HISTORY — PX: CYSTOSCOPY WITH RETROGRADE PYELOGRAM, URETEROSCOPY AND STENT PLACEMENT: SHX5789

## 2022-02-27 LAB — GLUCOSE, CAPILLARY
Glucose-Capillary: 109 mg/dL — ABNORMAL HIGH (ref 70–99)
Glucose-Capillary: 117 mg/dL — ABNORMAL HIGH (ref 70–99)

## 2022-02-27 LAB — PREGNANCY, URINE: Preg Test, Ur: NEGATIVE

## 2022-02-27 SURGERY — CYSTOURETEROSCOPY, WITH RETROGRADE PYELOGRAM AND STENT INSERTION
Anesthesia: General | Site: Urethra | Laterality: Bilateral

## 2022-02-27 MED ORDER — TAMSULOSIN HCL 0.4 MG PO CAPS: 0.4000 mg | ORAL_CAPSULE | Freq: Every day | ORAL | 0 refills | Status: DC

## 2022-02-27 MED ORDER — LIDOCAINE 2% (20 MG/ML) 5 ML SYRINGE
INTRAMUSCULAR | Status: DC | PRN
  Administered 2022-02-27: 60 mg via INTRAVENOUS

## 2022-02-27 MED ORDER — LACTATED RINGERS IV SOLN: INTRAVENOUS | Status: DC

## 2022-02-27 MED ORDER — HYDROMORPHONE HCL 1 MG/ML IJ SOLN
INTRAMUSCULAR | Status: AC
  Administered 2022-02-27: 0.5 mg via INTRAVENOUS
  Filled 2022-02-27: qty 1

## 2022-02-27 MED ORDER — AMISULPRIDE (ANTIEMETIC) 5 MG/2ML IV SOLN: 10.0000 mg | Freq: Once | INTRAVENOUS | Status: DC | PRN

## 2022-02-27 MED ORDER — ORAL CARE MOUTH RINSE: 15.0000 mL | Freq: Once | OROMUCOSAL | Status: AC

## 2022-02-27 MED ORDER — ONDANSETRON HCL 4 MG/2ML IJ SOLN
INTRAMUSCULAR | Status: AC
  Filled 2022-02-27: qty 2

## 2022-02-27 MED ORDER — CEPHALEXIN 500 MG PO CAPS: 500.0000 mg | ORAL_CAPSULE | Freq: Once | ORAL | 0 refills | Status: AC

## 2022-02-27 MED ORDER — HYDROMORPHONE HCL 2 MG/ML IJ SOLN
INTRAMUSCULAR | Status: AC
  Filled 2022-02-27: qty 1

## 2022-02-27 MED ORDER — PROPOFOL 10 MG/ML IV BOLUS
INTRAVENOUS | Status: DC | PRN
  Administered 2022-02-27: 200 mg via INTRAVENOUS

## 2022-02-27 MED ORDER — ONDANSETRON HCL 4 MG/2ML IJ SOLN: 4.0000 mg | Freq: Once | INTRAMUSCULAR | Status: DC | PRN

## 2022-02-27 MED ORDER — OXYBUTYNIN CHLORIDE 5 MG PO TABS: 5.0000 mg | ORAL_TABLET | Freq: Three times a day (TID) | ORAL | 0 refills | Status: DC | PRN

## 2022-02-27 MED ORDER — LIDOCAINE HCL (PF) 2 % IJ SOLN
INTRAMUSCULAR | Status: AC
  Filled 2022-02-27: qty 5

## 2022-02-27 MED ORDER — MIDAZOLAM HCL 2 MG/2ML IJ SOLN
INTRAMUSCULAR | Status: AC
  Filled 2022-02-27: qty 2

## 2022-02-27 MED ORDER — CHLORHEXIDINE GLUCONATE 0.12 % MT SOLN
15.0000 mL | Freq: Once | OROMUCOSAL | Status: AC
  Administered 2022-02-27: 15 mL via OROMUCOSAL

## 2022-02-27 MED ORDER — DEXAMETHASONE SODIUM PHOSPHATE 10 MG/ML IJ SOLN
INTRAMUSCULAR | Status: AC
  Filled 2022-02-27: qty 1

## 2022-02-27 MED ORDER — DEXAMETHASONE SODIUM PHOSPHATE 10 MG/ML IJ SOLN
INTRAMUSCULAR | Status: DC | PRN
  Administered 2022-02-27: 10 mg via INTRAVENOUS

## 2022-02-27 MED ORDER — IOHEXOL 300 MG/ML  SOLN
INTRAMUSCULAR | Status: DC | PRN
  Administered 2022-02-27 (×2): 10 mL via URETHRAL

## 2022-02-27 MED ORDER — ROCURONIUM BROMIDE 10 MG/ML (PF) SYRINGE
PREFILLED_SYRINGE | INTRAVENOUS | Status: AC
  Filled 2022-02-27: qty 10

## 2022-02-27 MED ORDER — SODIUM CHLORIDE 0.9 % IR SOLN
Status: DC | PRN
  Administered 2022-02-27: 3000 mL via INTRAVESICAL
  Administered 2022-02-27: 1000 mL via INTRAVESICAL

## 2022-02-27 MED ORDER — ONDANSETRON HCL 4 MG/2ML IJ SOLN
INTRAMUSCULAR | Status: DC | PRN
  Administered 2022-02-27: 4 mg via INTRAVENOUS

## 2022-02-27 MED ORDER — OXYCODONE HCL 5 MG PO TABS: 5.0000 mg | ORAL_TABLET | Freq: Once | ORAL | Status: AC | PRN

## 2022-02-27 MED ORDER — HYDROMORPHONE HCL 1 MG/ML IJ SOLN
INTRAMUSCULAR | Status: DC | PRN
  Administered 2022-02-27 (×4): .5 mg via INTRAVENOUS

## 2022-02-27 MED ORDER — CEFAZOLIN SODIUM-DEXTROSE 2-4 GM/100ML-% IV SOLN
2.0000 g | INTRAVENOUS | Status: AC
  Administered 2022-02-27: 2 g via INTRAVENOUS
  Filled 2022-02-27: qty 100

## 2022-02-27 MED ORDER — OXYCODONE HCL 5 MG PO TABS
ORAL_TABLET | ORAL | Status: AC
  Administered 2022-02-27: 5 mg via ORAL
  Filled 2022-02-27: qty 1

## 2022-02-27 MED ORDER — PROPOFOL 10 MG/ML IV BOLUS
INTRAVENOUS | Status: AC
  Filled 2022-02-27: qty 20

## 2022-02-27 MED ORDER — MIDAZOLAM HCL 5 MG/5ML IJ SOLN
INTRAMUSCULAR | Status: DC | PRN
Start: 2022-02-27 — End: 2022-02-27
  Administered 2022-02-27 (×2): 2 mg via INTRAVENOUS

## 2022-02-27 MED ORDER — HYDROMORPHONE HCL 1 MG/ML IJ SOLN
0.2500 mg | INTRAMUSCULAR | Status: DC | PRN
  Administered 2022-02-27 (×2): 0.25 mg via INTRAVENOUS

## 2022-02-27 MED ORDER — OXYCODONE HCL 5 MG/5ML PO SOLN: 5.0000 mg | Freq: Once | ORAL | Status: AC | PRN

## 2022-02-27 MED ORDER — TRAMADOL HCL 50 MG PO TABS: 50.0000 mg | ORAL_TABLET | Freq: Four times a day (QID) | ORAL | 0 refills | Status: AC | PRN

## 2022-02-27 SURGICAL SUPPLY — 27 items
BAG URO CATCHER STRL LF (MISCELLANEOUS) ×2 IMPLANT
BASKET ZERO TIP NITINOL 2.4FR (BASKET) ×1 IMPLANT
CATH URETL OPEN 5X70 (CATHETERS) ×2 IMPLANT
CLOTH BEACON ORANGE TIMEOUT ST (SAFETY) ×1 IMPLANT
DRSG TEGADERM 2-3/8X2-3/4 SM (GAUZE/BANDAGES/DRESSINGS) IMPLANT
EXTRACTOR STONE 1.7FRX115CM (UROLOGICAL SUPPLIES) IMPLANT
GLOVE BIO SURGEON STRL SZ 6.5 (GLOVE) ×2 IMPLANT
GLOVE BIOGEL M 7.0 STRL (GLOVE) ×2 IMPLANT
GLOVE BIOGEL PI IND STRL 6.5 (GLOVE) IMPLANT
GLOVE BIOGEL PI IND STRL 7.0 (GLOVE) IMPLANT
GLOVE BIOGEL PI INDICATOR 6.5 (GLOVE) ×1
GLOVE BIOGEL PI INDICATOR 7.0 (GLOVE) ×1
GOWN STRL REUS W/TWL LRG LVL3 (GOWN DISPOSABLE) ×3 IMPLANT
GUIDEWIRE STR DUAL SENSOR (WIRE) ×3 IMPLANT
KIT BALLN UROMAX 15FX4 (MISCELLANEOUS) IMPLANT
KIT BALLN UROMAX 26 75X4 (MISCELLANEOUS) ×2
KIT TURNOVER KIT A (KITS) ×1 IMPLANT
LASER FIB FLEXIVA PULSE ID 365 (Laser) ×1 IMPLANT
MANIFOLD NEPTUNE II (INSTRUMENTS) ×2 IMPLANT
PACK CYSTO (CUSTOM PROCEDURE TRAY) ×2 IMPLANT
SHEATH NAVIGATOR HD 11/13X28 (SHEATH) ×1 IMPLANT
SHEATH NAVIGATOR HD 11/13X36 (SHEATH) IMPLANT
STENT URET 6FRX24 CONTOUR (STENTS) ×2 IMPLANT
TRACTIP FLEXIVA PULS ID 200XHI (Laser) ×1 IMPLANT
TRACTIP FLEXIVA PULSE ID 200 (Laser) ×2
TUBING CONNECTING 10 (TUBING) ×2 IMPLANT
TUBING UROLOGY SET (TUBING) ×2 IMPLANT

## 2022-02-27 NOTE — Anesthesia Preprocedure Evaluation (Signed)
Anesthesia Evaluation  ?Patient identified by MRN, date of birth, ID band ?Patient awake ? ? ? ?Reviewed: ?Allergy & Precautions, NPO status , Patient's Chart, lab work & pertinent test results, reviewed documented beta blocker date and time  ? ?History of Anesthesia Complications ?(+) Family history of anesthesia reaction ? ?Airway ?Mallampati: III ? ?TM Distance: >3 FB ?Neck ROM: Full ? ? ? Dental ?no notable dental hx. ?(+) Teeth Intact, Dental Advisory Given ?  ?Pulmonary ? ?  ?Pulmonary exam normal ?breath sounds clear to auscultation ? ? ? ? ? ? Cardiovascular ?hypertension, Normal cardiovascular exam ?Rhythm:Regular Rate:Normal ? ?Hx/o PIH ?  ?Neuro/Psych ? Headaches, PSYCHIATRIC DISORDERS Anxiety Depression Left Cubital tunnel syndrome ? Neuromuscular disease   ? GI/Hepatic ?Neg liver ROS, GERD  Medicated,  ?Endo/Other  ?diabetes, Gestational ? Renal/GU ?Renal diseaseBilateral renal calculi  ?negative genitourinary ?  ?Musculoskeletal ? ?(+) Arthritis , Osteoarthritis,   ? Abdominal ?  ?Peds ? Hematology ? ?(+) Blood dyscrasia, anemia ,   ?Anesthesia Other Findings ? ? Reproductive/Obstetrics ? ?  ? ? ? ? ? ? ? ? ? ? ? ? ? ?  ?  ? ? ? ? ? ? ? ? ?Anesthesia Physical ?Anesthesia Plan ? ?ASA: 2 ? ?Anesthesia Plan: General  ? ?Post-op Pain Management:   ? ?Induction: Intravenous ? ?PONV Risk Score and Plan: 4 or greater and Treatment may vary due to age or medical condition, Midazolam, Dexamethasone and Ondansetron ? ?Airway Management Planned: LMA ? ?Additional Equipment: None ? ?Intra-op Plan:  ? ?Post-operative Plan: Extubation in OR ? ?Informed Consent: I have reviewed the patients History and Physical, chart, labs and discussed the procedure including the risks, benefits and alternatives for the proposed anesthesia with the patient or authorized representative who has indicated his/her understanding and acceptance.  ? ? ? ?Dental advisory given ? ?Plan Discussed with:  Anesthesiologist and CRNA ? ?Anesthesia Plan Comments:   ? ? ? ? ? ? ?Anesthesia Quick Evaluation ? ?

## 2022-02-27 NOTE — Anesthesia Procedure Notes (Signed)
Procedure Name: LMA Insertion ?Date/Time: 02/27/2022 2:38 PM ?Performed by: Rosaland Lao, CRNA ?Pre-anesthesia Checklist: Patient identified, Emergency Drugs available, Suction available and Patient being monitored ?Patient Re-evaluated:Patient Re-evaluated prior to induction ?Oxygen Delivery Method: Circle system utilized ?Preoxygenation: Pre-oxygenation with 100% oxygen ?Induction Type: IV induction ?LMA: LMA with gastric port inserted ?LMA Size: 4.0 ?Number of attempts: 1 ?Placement Confirmation: positive ETCO2 and breath sounds checked- equal and bilateral ?Tube secured with: Tape ?Dental Injury: Teeth and Oropharynx as per pre-operative assessment  ? ? ? ? ?

## 2022-02-27 NOTE — Transfer of Care (Signed)
Immediate Anesthesia Transfer of Care Note ? ?Patient: Charlotte Nelson ? ?Procedure(s) Performed: CYSTOSCOPY WITH RETROGRADE PYELOGRAM, URETEROSCOPY AND STENT PLACEMENT (Bilateral: Urethra) ?HOLMIUM LASER APPLICATION (Bilateral: Urethra) ? ?Patient Location: PACU ? ?Anesthesia Type:General ? ?Level of Consciousness: awake, alert  and oriented ? ?Airway & Oxygen Therapy: Patient Spontanous Breathing and Patient connected to face mask ? ?Post-op Assessment: Report given to RN and Post -op Vital signs reviewed and stable ? ?Post vital signs: Reviewed and stable ? ?Last Vitals:  ?Vitals Value Taken Time  ?BP 132/79 02/27/22 1557  ?Temp    ?Pulse 80 02/27/22 1600  ?Resp 9 02/27/22 1600  ?SpO2 95 % 02/27/22 1600  ?Vitals shown include unvalidated device data. ? ?Last Pain:  ?Vitals:  ? 02/27/22 1259  ?TempSrc:   ?PainSc: 0-No pain  ?   ? ?Patients Stated Pain Goal: 3 (02/27/22 1259) ? ?Complications: No notable events documented. ?

## 2022-02-27 NOTE — Interval H&P Note (Signed)
History and Physical Interval Note: ? ?02/27/2022 ?2:17 PM ? ?Tiffony Beyersdorf  has presented today for surgery, with the diagnosis of BILATERAL RENAL STONES.  The various methods of treatment have been discussed with the patient and family. After consideration of risks, benefits and other options for treatment, the patient has consented to  Procedure(s): ?CYSTOSCOPY WITH RETROGRADE PYELOGRAM, URETEROSCOPY AND STENT PLACEMENT (Bilateral) ?HOLMIUM LASER APPLICATION (Bilateral) as a surgical intervention.  The patient's history has been reviewed, patient examined, no change in status, stable for surgery.  I have reviewed the patient's chart and labs.  Questions were answered to the patient's satisfaction.   ? ? ?Karris Deangelo D Jaxon Flatt ? ? ?

## 2022-02-27 NOTE — H&P (Signed)
CC/HPI: cc: urolithiasis  ? 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 She is undergone ESWL with stent placement in the past. Following stent removal she became acutely obstructed and subsequently passed the stone. The pain was severe at that point in time and she is anxious about passing stones and stents in general. She is considering having another child and would like to have stones managed prior to that. She has been experiencing intermittent left flank pain consistent with prior stone/infection. Urinalysis today shows microscopic hematuria with 3-10 RBCs per high-powered field.  ? ?  ?ALLERGIES: CeleXA TABS ?Cymbalta ?  ? ?MEDICATIONS: Multiple Vitamin  ?Probiotic  ?Vitamin D3  ?  ? ?GU PSH: Cystoscopy Insert Stent - 2016 ?ESWL - 2017, Left - 2017, 2016 ? ?  ?   ?PSH Notes: Cystoscopy With Insertion Of Ureteral Stent Left, Renal Lithotripsy, Oral Surgery  ? ?NON-GU PSH: No Non-GU PSH   ? ?GU PMH: Renal calculus - 01/11/2022, - 12/29/2021, - 11/28/2021, - 2021, - 2017, Nephrolithiasis, - 2017 ?Other microscopic hematuria - 12/29/2021, Microscopic hematuria, - 2017 ?Renal colic, Renal colic - 9381 ?  ? ?NON-GU PMH: Pyuria/other UA findings - 11/28/2021 ?Other specified postprocedural states - 2017, - 2017 ?Encounter for general adult medical examination without abnormal findings, Encounter for preventive health examination - 2017 ?Other constipation, Chronic constipation - 2016 ?Anxiety, Anxiety - 2016 ?Personal history of other diseases of the digestive system, History of esophageal reflux - 2016 ?  ? ?FAMILY HISTORY: Diabetes - Runs In Family ?Hypothyroidism - Runs In Family ?Kidney Stones - Runs In Family ?Prostate Cancer - Runs In Family  ? ?SOCIAL HISTORY: Marital Status: Married ?Preferred Language: [place]; Ethnicity: Not Hispanic Or Latino; Race: White ?Current Smoking Status: Patient has never  smoked.  ?Drinks 1 caffeinated drink per day. ?Patient's occupation Midwife. ?  ?  Notes: Caffeine use, Student, Never a smoker, Alcohol use, Married  ? ?REVIEW OF SYSTEMS:    ?GU Review Female:   Patient denies frequent urination, hard to postpone urination, burning /pain with urination, get up at night to urinate, leakage of urine, stream starts and stops, trouble starting your stream, have to strain to urinate, and being pregnant.  ?Gastrointestinal (Upper):   Patient denies nausea, vomiting, and indigestion/ heartburn.  ?Gastrointestinal (Lower):   Patient denies diarrhea and constipation.  ?Constitutional:   Patient denies fever, night sweats, weight loss, and fatigue.  ?Skin:   Patient denies skin rash/ lesion and itching.  ?Eyes:   Patient denies blurred vision and double vision.  ?Ears/ Nose/ Throat:   Patient denies sore throat and sinus problems.  ?Hematologic/Lymphatic:   Patient denies swollen glands and easy bruising.  ?Cardiovascular:   Patient denies leg swelling and chest pains.  ?Respiratory:   Patient denies cough and shortness of breath.  ?Endocrine:   Patient denies excessive thirst.  ?Musculoskeletal:   Patient denies back pain and joint pain.  ?Neurological:   Patient denies headaches and dizziness.  ?Psychologic:   Patient denies depression and anxiety.  ? ?VITAL SIGNS:    ?  01/24/2022 08:19 AM  ?BP 111/77 mmHg  ?Pulse 88 /min  ?Temperature 98.4 F / 36.8 C  ? ?MULTI-SYSTEM PHYSICAL EXAMINATION:    ?Constitutional: Well-nourished. No physical deformities. Normally developed. Good grooming.  ?Neck: Neck symmetrical, not swollen. Normal tracheal position.  ?Respiratory: No labored breathing, no use of accessory muscles.   ?  Skin: No paleness, no jaundice, no cyanosis. No lesion, no ulcer, no rash.  ?Neurologic / Psychiatric: Oriented to time, oriented to place, oriented to person. No depression, no anxiety, no agitation.  ?Eyes: Normal conjunctivae. Normal eyelids.   ?Ears, Nose, Mouth, and Throat: Left ear no scars, no lesions, no masses. Right ear no scars, no lesions, no masses. Nose no scars, no lesions, no masses. Normal hearing. Normal lips.  ?Musculoskeletal: Normal gait and station of head and neck.  ? ?  ?Complexity of Data:  ?Records Review:   Previous Patient Records, POC Tool  ?Urine Test Review:   Urinalysis  ?X-Ray Review: C.T. Abdomen/Pelvis: Reviewed Films. Reviewed Report. Discussed With Patient. IMPRESSION:  ?1. Multiple bilateral nonobstructive renal stones measuring up to 5  ?mm on the right and 6 mm on the left. No obstructive ureteral or  ?bladder calculi identified.  ?2. Moderate volume of formed stool throughout the colon suggesting  ?constipation.  ? ? ?Electronically Signed  ?By: Dahlia Bailiff M.D.  ?On: 01/12/2022 12:16 ?  ?Notes:                     09/20/2021: BUN 18, creatinine 0.72  ? ?PROCEDURES:    ? ?     Urinalysis w/Scope ?Dipstick Dipstick Cont'd Micro  ?Color: Yellow Bilirubin: Neg mg/dL WBC/hpf: NS (Not Seen)  ?Appearance: Clear Ketones: Neg mg/dL RBC/hpf: 3 - 10/hpf  ?Specific Gravity: 1.025 Blood: 1+ ery/uL Bacteria: Few (10-25/hpf)  ?pH: 5.5 Protein: Neg mg/dL Cystals: NS (Not Seen)  ?Glucose: Neg mg/dL Urobilinogen: 0.2 mg/dL Casts: NS (Not Seen)  ?  Nitrites: Neg Trichomonas: Not Present  ?  Leukocyte Esterase: Neg leu/uL Mucous: Present  ?    Epithelial Cells: 0 - 5/hpf  ?    Yeast: NS (Not Seen)  ?    Sperm: Not Present  ? ? ?ASSESSMENT:  ?    ICD-10 Details  ?1 GU:   Renal calculus - N20.0 Chronic, Stable  ?2   Microscopic hematuria - R31.21 Undiagnosed New Problem  ? ?PLAN:    ? ?      Document ?Letter(s):  Created for Patient: Clinical Summary  ? ? ?     Notes:   Renal calculi: Discussed with patient overall stone burden with L>R. We had a lengthy discussion regarding management of stones including observation, staged ESWL, or bilateral ureteroscopy with laser lithotripsy and stent placement. She understands that these could be  staged procedures. We discussed the risks and benefits of bilateral ureteroscopy including but not limited to pain, bleeding, infection, stent discomfort, need for staged procedure, inability to remove all stones, failure of procedure, damage to surrounding structures. Patient wishes to proceed with bilateral ureteroscopy and attempt to decrease stone burden prior to potentially becoming pregnant again. This will be scheduled for May 9.  ? ?Microscopic hematuria: Patient will have diagnostic cystoscopy in the operating room as well as bilateral ureteroscopy to evaluate for any lesions. She recently had a renal ultrasound that did not show any masses.  ? ?  ? ?

## 2022-02-27 NOTE — Anesthesia Postprocedure Evaluation (Signed)
Anesthesia Post Note ? ?Patient: Charlotte Nelson ? ?Procedure(s) Performed: CYSTOSCOPY WITH RETROGRADE PYELOGRAM, URETEROSCOPY AND STENT PLACEMENT (Bilateral: Urethra) ?HOLMIUM LASER APPLICATION (Bilateral: Urethra) ? ?  ? ?Patient location during evaluation: PACU ?Anesthesia Type: General ?Level of consciousness: awake and alert ?Pain management: pain level controlled ?Vital Signs Assessment: post-procedure vital signs reviewed and stable ?Respiratory status: spontaneous breathing, nonlabored ventilation, respiratory function stable and patient connected to nasal cannula oxygen ?Cardiovascular status: blood pressure returned to baseline and stable ?Postop Assessment: no apparent nausea or vomiting ?Anesthetic complications: no ? ? ?No notable events documented. ? ?Last Vitals:  ?Vitals:  ? 02/27/22 1700 02/27/22 1715  ?BP: (!) 138/96 (!) 143/86  ?Pulse: 80 87  ?Resp: 15 15  ?Temp:    ?SpO2: 95% 100%  ?  ?  ?  ?  ?  ?  ? ?Effie Berkshire ? ? ? ? ?

## 2022-02-27 NOTE — Op Note (Signed)
Preoperative diagnosis: bilateral renal calculi ? ?Postoperative diagnosis: bilateral renal calculi ? ?Procedure: ? ?Cystoscopy ?Left ureteroscopy, laser lithotripsy, basket stone extraction ?Left ureteral balloon dilation ?Right ureteroscopy, laser lithotripsy ?bilateral 30F x 24 ureteral stent placement - with tethers ?bilateral retrograde pyelography with interpretation ? ?Surgeon: Jacalyn Lefevre, MD ? ?Anesthesia: General ? ?Complications: None ? ?Intraoperative findings:  ?Normal urethra ?Bilateral lobe hypertrophy prostatic urethra ?Bilateral orthotropic ureteral orifices ?bilateral retrograde pyelography demonstrated a filling defect within the bilateral ureter consistent with the patient?s known calculus without other abnormalities. ?Bladder mucosa normal without masses  ?Dilated left collecting system to the UPJ on retrograde pyelogram ?Normal right retrograde pyelogram ?Multiple adherent calculi to the renal parenchyma ? ?EBL: Minimal ? ?Specimens: ?left renal calculus ? ?Disposition of specimens: [facility] for stone analysis ? ?Indication: Charlotte Nelson is a 41 y.o.   patient with bilateral renal stone and associated bilateral symptoms. After reviewing the management options for treatment, the patient elected to proceed with the above surgical procedure(s). We have discussed the potential benefits and risks of the procedure, side effects of the proposed treatment, the likelihood of the patient achieving the goals of the procedure, and any potential problems that might occur during the procedure or recuperation. Informed consent has been obtained. ? ? ?Description of procedure: ? ?The patient was taken to the operating room and general anesthesia was induced.  The patient was placed in the dorsal lithotomy position, prepped and draped in the usual sterile fashion, and preoperative antibiotics were administered. A preoperative time-out was performed.  ? ?Cystourethroscopy was performed.   The patient?s urethra was examined and was normal. The bladder was then systematically examined in its entirety. There was no evidence for any bladder tumors, stones, or other mucosal pathology.   ? ?Attention then turned to the left ureteral orifice and a ureteral catheter was used to intubate the ureteral orifice.  Omnipaque contrast was injected through the ureteral catheter and a retrograde pyelogram was performed with findings as dictated above. ? ?A 0.38 sensor guidewire was then advanced up the left ureter into the renal pelvis under fluoroscopic guidance.  A second sensor wire was placed alongside the first wire in similar manner.  Next ureteral access sheath was placed over the second wire and advanced to the proximal ureter with fluoroscopic guidance.  The inner wire and sheath were removed.  Flexible ureteroscopy then took place.   ? ?There was a narrowing encountered at the UPJ.  This would not allow the flexible ureteroscope to pass.  The decision was made to balloon dilate the UPJ with the UroMax balloon dilator to 14 mmHg.  After the balloon dilator was placed over a wire using the radiopaque markers in the appropriate position it was deflated.  Attempts at then traversing the UPJ were successful. ? ?Stones were encountered in the upper pole and lower pole.  There were also noted to be stones adherent to the renal parenchyma and one in a calyx that was inaccessible with the ureteroscope due to narrow opening and angle.  The stone was then fragmented with the 242 micron holmium laser fiber. All larger stones were then removed from the ureter with a 0 tip basket.  Reinspection of the ureter revealed no remaining visible stones or fragments.  ? ?The wire was then backloaded through the cystoscope and a ureteral stent was advance over the wire using Seldinger technique.  The stent was positioned appropriately under fluoroscopic and cystoscopic guidance.  The wire was then removed with an  adequate stent  curl noted in the renal pelvis as well as in the bladder. ? ?Attention then turned to the right-hand side.  A retrograde pyelogram was obtained and 2 wires were placed in the same manner.  Again an access sheath was placed over the second wire and up to the proximal ureter with fluoroscopic guidance.  The inner sheath and wire removed.  Flexible ureteroscopy then took place.  Again there noted to be multiple adherent stone to the renal parenchyma.  There are a few smaller calculi in the lower pole that were dusted with the holmium laser.  No stone fragments were amenable to basketing. ? ?The ureteroscope was removed and unison with the access sheath taking care to examine the ureter on the way out.  No trauma or stone fragments are noted in the ureter.  A stent was then placed in a similar manner to the left side with a tether left in place. ? ?The bladder was then emptied and the procedure ended.  The patient appeared to tolerate the procedure well and without complications.  The patient was able to be awakened and transferred to the recovery unit in satisfactory condition.  ? ?Disposition: The tether of the stent was left on and tucked inside the patient's vagina.  Instructions for removing the stent have been provided to the patient.   ?

## 2022-02-27 NOTE — Discharge Instructions (Addendum)
DISCHARGE INSTRUCTIONS FOR KIDNEY STONE/URETERAL STENT  ? ?MEDICATIONS:  ?1. Resume all your other meds from home  ?2. AZO over the counter can help with the burning/stinging when you urinate. ?3. Tramadol is for moderate/severe pain, otherwise taking up to 1000 mg every 6 hours of plainTylenol will help treat your pain.   ?4. Take Cipro one hour prior to removal of your stent.  ?5. Tamsulosin can help with stent discomfort. ?6. Oxybutynin can help with overactive bladder while the stents are in place ? ? ?ACTIVITY:  ?1. No strenuous activity x 1week  ?2. No driving while on narcotic pain medications  ?3. Drink plenty of water  ?4. Continue to walk at home - you can still get blood clots when you are at home, so keep active, but don't over do it.  ?5. May return to work/school tomorrow or when you feel ready  ? ?BATHING:  ?1. You can shower and we recommend daily showers  ?2. You have a string coming from your urethra: The stent string is attached to your ureteral stent. Do not pull on this.  ? ?SIGNS/SYMPTOMS TO CALL:  ?Please call us if you have a fever greater than 101.5, uncontrolled nausea/vomiting, uncontrolled pain, dizziness, unable to urinate, bloody urine, chest pain, shortness of breath, leg swelling, leg pain, redness around wound, drainage from wound, or any other concerns or questions.  ? ?You can reach [place] at [phone].  ? ?FOLLOW-UP:  ?1. You have strings attached to your stents, you may remove them on Friday, May 12. To do this, pull the strings until the stents are completely removed. You may feel an odd sensation in your back.  ?

## 2022-02-28 ENCOUNTER — Encounter (HOSPITAL_COMMUNITY): Payer: Self-pay | Admitting: Urology

## 2022-03-07 ENCOUNTER — Ambulatory Visit: Payer: 59 | Attending: Obstetrics and Gynecology

## 2022-03-07 DIAGNOSIS — R279 Unspecified lack of coordination: Secondary | ICD-10-CM | POA: Diagnosis present

## 2022-03-07 DIAGNOSIS — R293 Abnormal posture: Secondary | ICD-10-CM | POA: Insufficient documentation

## 2022-03-07 DIAGNOSIS — M62838 Other muscle spasm: Secondary | ICD-10-CM | POA: Diagnosis present

## 2022-03-07 DIAGNOSIS — M6281 Muscle weakness (generalized): Secondary | ICD-10-CM | POA: Insufficient documentation

## 2022-03-07 NOTE — Therapy (Signed)
?OUTPATIENT PHYSICAL THERAPY TREATMENT NOTE ? ? ?Patient Name: Charlotte Nelson ?MRN: [id number] ?DOB:1981-02-06, 41 y.o., female ?Today's Date: 03/07/2022 ? ?PCP: Silverio Decamp, MD ?REFERRING PROVIDER: Brien Few MD ? ?END OF SESSION:  ? PT End of Session - 03/07/22 1234   ? ? Visit Number 8   ? Date for PT Re-Evaluation 05/08/22   ? Authorization Type UHC   ? PT Start Time 1233   ? PT Stop Time 1312   ? PT Time Calculation (min) 39 min   ? Activity Tolerance Patient tolerated treatment well   ? Behavior During Therapy [facility] for tasks assessed/performed   ? ?  ?  ? ?  ? ? ?Past Medical History:  ?Diagnosis Date  ? Alopecia   ? Anemia   ? not currently  ? Anxiety   ? Depression   ? Family history of adverse reaction to anesthesia   ? PATERNAL GRANDFATHER HAD UNKNOWN PROBLEM  MANY YRS AGO - "IT WASN'T A SERIOUS PROBLEM"  ? Gastritis   ? GERD (gastroesophageal reflux disease)   ? Gestational diabetes   ? metformin and lantus insulin  ? Gestational diabetes mellitus, class A2 07/07/2011  ? does not normally have diabetes  ? Headache(784.0)   ? migraine  ? Hypertension in pregnancy, preeclampsia, delivered 11/17/2020  ? Kidney stones   ? OCD (obsessive compulsive disorder)   ? Pregnancy induced hypertension   ? nifedipine  ? Tremor   ? "familial tremor"  ? ?Past Surgical History:  ?Procedure Laterality Date  ? CYSTOSCOPY W/ URETERAL STENT PLACEMENT Left 09/22/2015  ? Procedure: CYSTOSCOPY WITH LEFT  RETROGRADE PYELOGRAM/ STENT PLACEMENT;  Surgeon: Carolan Clines, MD;  Location: [facility];  Service: Urology;  Laterality: Left;  ? CYSTOSCOPY WITH RETROGRADE PYELOGRAM, URETEROSCOPY AND STENT PLACEMENT Bilateral 02/27/2022  ? Procedure: CYSTOSCOPY WITH RETROGRADE PYELOGRAM, URETEROSCOPY AND STENT PLACEMENT;  Surgeon: Robley Fries, MD;  Location: [facility];  Service: Urology;  Laterality: Bilateral;  ? HOLMIUM LASER APPLICATION Bilateral 02/22/85  ? Procedure: HOLMIUM LASER APPLICATION;  Surgeon: Robley Fries,  MD;  Location: [facility];  Service: Urology;  Laterality: Bilateral;  ? LITHOTRIPSY    ? MOUTH SURGERY    ? wisdom teeth and gum x2  ? ?Patient Active Problem List  ? Diagnosis Date Noted  ? Thyroid enlargement 09/20/2021  ? Post-nasal drip 09/20/2021  ? Dysphagia 09/20/2021  ? Infected sebaceous cyst 06/13/2021  ? Superficial venous thrombosis of upper extremity, right 06/13/2021  ? Lactating mother 06/13/2021  ? Family history of DVT 06/13/2021  ? Family history of pulmonary embolism 06/13/2021  ? Hypertension in pregnancy, preeclampsia, delivered 11/17/2020  ? Postpartum care following vaginal delivery 11/17/2020  ? Encounter for elective induction of labor 11/16/2020  ? Dysuria 12/04/2019  ? Right wrist pain 11/06/2019  ? Perennial allergic rhinitis 11/06/2019  ? Palpitations 09/11/2019  ? Hair loss 05/22/2019  ? Screening for hyperlipidemia 04/27/2019  ? Cubital tunnel syndrome, left 02/24/2019  ? DDD (degenerative disc disease), lumbar 02/03/2019  ? Allergic conjunctivitis with xerophthalmia 01/22/2019  ? LPRD (laryngopharyngeal reflux disease) 11/25/2017  ? Not immune to hepatitis B virus 05/03/2016  ? Immunity status testing 05/01/2016  ? Migraine headache 07/07/2015  ? Dyspepsia 06/09/2015  ? Annual physical exam 05/12/2015  ? Left nephrolithiasis 05/12/2015  ? Essential tremor 05/12/2015  ? Mood disorder ([place]) 05/12/2015  ? Microcytosis 05/12/2015  ? Insulin controlled White classification A2 gestational diabetes mellitus (GDM) 07/07/2011  ? ? ?REFERRING DIAG: N81.9 (ICD-10-CM) -  Female genital prolapse, unspecified ?01/31/2022 ? ?THERAPY DIAG:  ?Other muscle spasm ? ?Muscle weakness (generalized) ? ?Unspecified lack of coordination ? ?Abnormal posture ? ?PERTINENT HISTORY: G4P4 ? ?PRECAUTIONS: NA ? ?SUBJECTIVE: Pt states that she is still recovering from lithotripsy and ureter dilation. She states that she is still feeling a lot of back pain and in general weak due to lying around more than normal. She has  not noticed any worsening coccydynia or typical low back pain through the last couple of weeks. She has noticed that not being able to hold urine/increased urgency has gotten much worse.  ? ?PAIN:  ?Are you having pain? No ? ? ?SUBJECTIVE 02/13/22:    Pt states that she is overall doing well with no changes. She continues to deny any coccydynia or Rt sided buttock/SIJ pain. She does still have stress urinary incontinence and pelvic pressure, but not as severe as before.                                                                                                                                                                                       ?  ?  ?PAIN:  ?Are you having pain? No ?  ?  ?  ?OBJECTIVE 02/13/22: *No internal pelvic exam performed for updated findings today due to patient preference while she is on menstrual cycle.  ?  ?Lumbar A/ROM: flexion WNL, extension 50% with pain in Rt buttock, bil side bend 75% with pull in low back; 75% bil rotation ?  ?Squat: WNL ?  ?Hip strength: Rt hip flexion/IR/ER 4/5, Lt hip flexion/IR/ER 5/5, Rt hip abduction 3/5, Lt hip adduction 4/5, Bil adduction 4/5, Rt extension 2/5 with pain, Lt extension 3/5 ?  ?ASLR: (-) bil ?  ?Palpation: tenderness in Rt glutes/Rt SIJ ?  ?  ?TODAY'S TREATMENT 03/07/22: ?Exercises: ?Stretches/mobility: ?Open books 10x bil ?Lower trunk rotation 3 x 10 ?Cat/cow 2 x10 ?Child's pose ?Forward fold stretch on table 2 x 2 min ?Standing hip circles 2 x 10 bil ?Self-care: ?Urge suppression technique ? ? ?TREATMENT 02/13/22: ?Manual: ?Soft tissue mobilization: ?Scar tissue mobilization: ?Myofascial release: ?Spinal mobilization: ?Internal pelvic floor techniques: ?Dry needling: ?Neuromuscular re-education: ?Core retraining:  ?Core facilitation: ?Standing Wood chop with 4lb 2 x 10 bil ?Marching on airex 3 x 10 ?3-way kick 10x each bil ?Form correction: ?Pelvic floor contraction training: ?Down training: ?Exercises: ?Stretches/mobility: ?Seated/propped  piriformis 60 sec bil ?Crescent lunge windmill 10x bil ?Strengthening: ?Sidestepping 2 laps red band ?Therapeutic activities: ?Functional strengthening activities: ?Self-care: ?  ?  ?  ?  ?PATIENT EDUCATION:  ?Education details: Pt education performed on exercise progressions ?Person educated: Patient ?Education method: Explanation,  Demonstration, Tactile cues, Verbal cues, and Handouts ?Education comprehension: verbalized understanding ?  ?  ?HOME EXERCISE PROGRAM: ?[id number]  ?  ?ASSESSMENT: ?  ?CLINICAL IMPRESSION: ?Due to pt still being sore, focus placed on mobility exercises this session. We discussed that spasms in ureters, urethra, and pelvic floor are most likely contributing to increase in urgency and that gentle movement and exercises will most likely help. She has not been walking or performing any stretches and was encouraged to do so to just increase circulation and help decrease inflammation. Good tolerance to all exercises. We discussed urge suppression technique with very gentle contractions to help with increased urgency. She will continue to benefit from skilled PT intervention in order to progress functional strengthening, perform manual techniques for pain management, and work towards goal completion. ?  ?  ?OBJECTIVE IMPAIRMENTS decreased activity tolerance, decreased coordination, decreased endurance, decreased mobility, decreased ROM, decreased strength, hypomobility, increased fascial restrictions, increased muscle spasms, impaired flexibility, impaired tone, postural dysfunction, and pain.  ?  ?ACTIVITY LIMITATIONS  prolonged sitting, standing, intercourse .  ?  ?PERSONAL FACTORS 1 comorbidity: G4P4  are also affecting patient's functional outcome.  ?  ?  ?REHAB POTENTIAL: Good ?  ?CLINICAL DECISION MAKING: Stable/uncomplicated ?  ?EVALUATION COMPLEXITY: Low ?  ?  ?GOALS: ?Goals reviewed with patient? Yes ?  ?SHORT TERM GOALS: Target date: 12/06/21 ?  ?Pt will be independent with HEP.  ?   ?Baseline: ?Goal status: MET ?  ?2.  Pt will demonstrate diaphragmatic breathing with appropriate rib and abdominal mobility in order to incrase pelvic floor A/ROM and relaxation ?Baseline:  ?Goal status: ME

## 2022-03-13 ENCOUNTER — Ambulatory Visit: Payer: 59

## 2022-03-13 DIAGNOSIS — M62838 Other muscle spasm: Secondary | ICD-10-CM

## 2022-03-13 DIAGNOSIS — R279 Unspecified lack of coordination: Secondary | ICD-10-CM

## 2022-03-13 DIAGNOSIS — M6281 Muscle weakness (generalized): Secondary | ICD-10-CM

## 2022-03-13 DIAGNOSIS — R293 Abnormal posture: Secondary | ICD-10-CM

## 2022-03-13 NOTE — Therapy (Signed)
OUTPATIENT PHYSICAL THERAPY TREATMENT NOTE   Patient Name: Charlotte Nelson MRN: [id number] DOB:December 31, 1980, 41 y.o., female Today's Date: 03/13/2022  PCP: Silverio Decamp, MD REFERRING PROVIDER: Brien Few MD  END OF SESSION:   PT End of Session - 03/13/22 1151     Visit Number 9    Date for PT Re-Evaluation 05/08/22    Authorization Type UHC    PT Start Time 2800    PT Stop Time 1227    PT Time Calculation (min) 38 min    Activity Tolerance Patient tolerated treatment well    Behavior During Therapy [facility]FL for tasks assessed/performed              Past Medical History:  Diagnosis Date   Alopecia    Anemia    not currently   Anxiety    Depression    Family history of adverse reaction to anesthesia    PATERNAL GRANDFATHER HAD UNKNOWN PROBLEM  MANY YRS AGO - "IT WASN'T A SERIOUS PROBLEM"   Gastritis    GERD (gastroesophageal reflux disease)    Gestational diabetes    metformin and lantus insulin   Gestational diabetes mellitus, class A2 07/07/2011   does not normally have diabetes   Headache(784.0)    migraine   Hypertension in pregnancy, preeclampsia, delivered 11/17/2020   Kidney stones    OCD (obsessive compulsive disorder)    Pregnancy induced hypertension    nifedipine   Tremor    "familial tremor"   Past Surgical History:  Procedure Laterality Date   CYSTOSCOPY W/ URETERAL STENT PLACEMENT Left 09/22/2015   Procedure: CYSTOSCOPY WITH LEFT  RETROGRADE PYELOGRAM/ STENT PLACEMENT;  Surgeon: Carolan Clines, MD;  Location: [facility];  Service: Urology;  Laterality: Left;   CYSTOSCOPY WITH RETROGRADE PYELOGRAM, URETEROSCOPY AND STENT PLACEMENT Bilateral 02/27/2022   Procedure: CYSTOSCOPY WITH RETROGRADE PYELOGRAM, URETEROSCOPY AND STENT PLACEMENT;  Surgeon: Robley Fries, MD;  Location: [facility];  Service: Urology;  Laterality: Bilateral;   HOLMIUM LASER APPLICATION Bilateral 12/23/9177   Procedure: HOLMIUM LASER APPLICATION;  Surgeon: Robley Fries,  MD;  Location: [facility];  Service: Urology;  Laterality: Bilateral;   LITHOTRIPSY     MOUTH SURGERY     wisdom teeth and gum x2   Patient Active Problem List   Diagnosis Date Noted   Thyroid enlargement 09/20/2021   Post-nasal drip 09/20/2021   Dysphagia 09/20/2021   Infected sebaceous cyst 06/13/2021   Superficial venous thrombosis of upper extremity, right 06/13/2021   Lactating mother 06/13/2021   Family history of DVT 06/13/2021   Family history of pulmonary embolism 06/13/2021   Hypertension in pregnancy, preeclampsia, delivered 11/17/2020   Postpartum care following vaginal delivery 11/17/2020   Encounter for elective induction of labor 11/16/2020   Dysuria 12/04/2019   Right wrist pain 11/06/2019   Perennial allergic rhinitis 11/06/2019   Palpitations 09/11/2019   Hair loss 05/22/2019   Screening for hyperlipidemia 04/27/2019   Cubital tunnel syndrome, left 02/24/2019   DDD (degenerative disc disease), lumbar 02/03/2019   Allergic conjunctivitis with xerophthalmia 01/22/2019   LPRD (laryngopharyngeal reflux disease) 11/25/2017   Not immune to hepatitis B virus 05/03/2016   Immunity status testing 05/01/2016   Migraine headache 07/07/2015   Dyspepsia 06/09/2015   Annual physical exam 05/12/2015   Left nephrolithiasis 05/12/2015   Essential tremor 05/12/2015   Mood disorder ([place]) 05/12/2015   Microcytosis 05/12/2015   Insulin controlled White classification A2 gestational diabetes mellitus (GDM) 07/07/2011    REFERRING DIAG: N81.9 (ICD-10-CM) -  Female genital prolapse, unspecified 01/31/2022  THERAPY DIAG:  Other muscle spasm  Muscle weakness (generalized)  Unspecified lack of coordination  Abnormal posture  PERTINENT HISTORY: G4P4  PRECAUTIONS: NA  SUBJECTIVE: Pt states that she ended up having kidney infection and is feeling much better after treatment. She is not currently having any pain. She is having more urinary leaking and much more urgency since  surgery. She is not feeling any vaginal pressure.   PAIN:  Are you having pain? No   SUBJECTIVE 02/13/22:    Pt states that she is overall doing well with no changes. She continues to deny any coccydynia or Rt sided buttock/SIJ pain. She does still have stress urinary incontinence and pelvic pressure, but not as severe as before.                                                                                                                                                                                           PAIN:  Are you having pain? No       OBJECTIVE 02/13/22: *No internal pelvic exam performed for updated findings today due to patient preference while she is on menstrual cycle.    Lumbar A/ROM: flexion WNL, extension 50% with pain in Rt buttock, bil side bend 75% with pull in low back; 75% bil rotation   Squat: WNL   Hip strength: Rt hip flexion/IR/ER 4/5, Lt hip flexion/IR/ER 5/5, Rt hip abduction 3/5, Lt hip adduction 4/5, Bil adduction 4/5, Rt extension 2/5 with pain, Lt extension 3/5   ASLR: (-) bil   Palpation: tenderness in Rt glutes/Rt SIJ     TODAY'S TREATMENT 03/13/22: Neuromuscular re-education: Core facilitation: Bil UE extensions in seated green band, 2 x 10 Chop bil seated with 3 lb weight 10x bil Seated anterior weight hold with march 2 x 10 2 lb weight Exercises: Stretches/mobility: Hamstring stretch 2 x 60 sec bil Piriformis stretch seated 60 sec bil Seated side bend 10x bil Strengthening: Seated clam shell blue band 2 x 10 Deadlift 5lb weight 2 x 10 Self-care: Urge suppression technique  Strict bladder retraining   TREATMENT 03/07/22: Exercises: Stretches/mobility: Open books 10x bil Lower trunk rotation 3 x 10 Cat/cow 2 x10 Child's pose Forward fold stretch on table 2 x 2 min Standing hip circles 2 x 10 bil Self-care: Urge suppression technique   TREATMENT 02/13/22: Manual: Soft tissue mobilization: Scar tissue  mobilization: Myofascial release: Spinal mobilization: Internal pelvic floor techniques: Dry needling: Neuromuscular re-education: Core retraining:  Core facilitation: Standing Wood chop with 4lb 2 x 10 bil Marching on airex 3 x 10 3-way kick 10x each bil Form  correction: Pelvic floor contraction training: Down training: Exercises: Stretches/mobility: Seated/propped piriformis 60 sec bil Crescent lunge windmill 10x bil Strengthening: Sidestepping 2 laps red band Therapeutic activities: Functional strengthening activities: Self-care:         PATIENT EDUCATION:  Education details: see above self-care Person educated: Patient Education method: Explanation, Demonstration, Tactile cues, Verbal cues, and Handouts Education comprehension: verbalized understanding     HOME EXERCISE PROGRAM: [id number]    ASSESSMENT:   CLINICAL IMPRESSION: Pt doing much better this week now that infection is gone. She is on menstrual cycle and asks to hold off on pelvic floor exam. We focused on core strengthening with breath coordination and gentle functional mobility. She tolerated all exercises very well with no increase in pain and appropriate challenge. She will continue to benefit from skilled PT intervention in order to progress functional strengthening, perform manual techniques for pain management, and work towards goal completion.     OBJECTIVE IMPAIRMENTS decreased activity tolerance, decreased coordination, decreased endurance, decreased mobility, decreased ROM, decreased strength, hypomobility, increased fascial restrictions, increased muscle spasms, impaired flexibility, impaired tone, postural dysfunction, and pain.    ACTIVITY LIMITATIONS  prolonged sitting, standing, intercourse .    PERSONAL FACTORS 1 comorbidity: G4P4  are also affecting patient's functional outcome.      REHAB POTENTIAL: Good   CLINICAL DECISION MAKING: Stable/uncomplicated   EVALUATION COMPLEXITY: Low      GOALS: Goals reviewed with patient? Yes   SHORT TERM GOALS: Target date: 12/06/21   Pt will be independent with HEP.    Baseline: Goal status: MET   2.  Pt will demonstrate diaphragmatic breathing with appropriate rib and abdominal mobility in order to incrase pelvic floor A/ROM and relaxation Baseline:  Goal status: MET     LONG TERM GOALS: Target date: 01/31/22   Pt will be independent with advanced HEP.    Baseline: Pt working on being more compliant with HEP Goal status: IN PROGRESS   2.  Pt will demonstrate 4/5 pelvic floor muscle strength with appropriate coordination and relaxation in order to decrease urinary incontinence and provide lumbopelvic stability Baseline: Pt has not been working on pelvic floor strengthening due to tension and pain Goal status: IN PROGRESS   3.  Pt will report no more than one episode of coccydynia/LBP a week with pain levels not exceeding 1/10 in order to perform work and functional activities without difficulty Baseline:  Goal status: MET     PLAN: PT FREQUENCY: 1x/week   PT DURATION: 12 weeks   PLANNED INTERVENTIONS: Therapeutic exercises, Therapeutic activity, Neuromuscular re-education, Balance training, Gait training, Patient/Family education, Joint mobilization, Dry Needling, Biofeedback, and Manual therapy   PLAN FOR NEXT SESSION: Plan to perform internal pelvic floor assessment to reassess tension, vaginal wall laxity, and strength - if all improving, plan to progress pelvic floor strengthening again with emphasis on relaxation.    Sabena Roberts, PT, DPT05/23/2312:28 PM

## 2022-03-13 NOTE — Patient Instructions (Signed)
Bladder retraining:   Drink 4-8oz an hour ONLY WATER Stop water intake 3 hours before bed Try to go at least 2-3 hours between trips to the bathroom - go whether you need to or not.  For two weeks.  

## 2022-03-28 ENCOUNTER — Ambulatory Visit: Payer: 59 | Attending: Obstetrics and Gynecology

## 2022-03-28 DIAGNOSIS — M62838 Other muscle spasm: Secondary | ICD-10-CM | POA: Diagnosis present

## 2022-03-28 DIAGNOSIS — R293 Abnormal posture: Secondary | ICD-10-CM | POA: Insufficient documentation

## 2022-03-28 DIAGNOSIS — R279 Unspecified lack of coordination: Secondary | ICD-10-CM | POA: Diagnosis present

## 2022-03-28 DIAGNOSIS — M6281 Muscle weakness (generalized): Secondary | ICD-10-CM | POA: Insufficient documentation

## 2022-03-28 NOTE — Therapy (Signed)
OUTPATIENT PHYSICAL THERAPY TREATMENT NOTE   Patient Name: Charlotte Nelson MRN: [id number] DOB:1981-04-20, 41 y.o., female Today's Date: 03/28/2022  PCP: Silverio Decamp, MD REFERRING PROVIDER: Brien Few MD  END OF SESSION:   PT End of Session - 03/28/22 1150     Visit Number 10    Date for PT Re-Evaluation 05/08/22    Authorization Type [employer]    PT Start Time 1146    PT Stop Time 1228    PT Time Calculation (min) 42 min    Activity Tolerance Patient tolerated treatment well    Behavior During Therapy WFL for tasks assessed/performed              Past Medical History:  Diagnosis Date   Alopecia    Anemia    not currently   Anxiety    Depression    Family history of adverse reaction to anesthesia    PATERNAL GRANDFATHER HAD UNKNOWN PROBLEM  MANY YRS AGO - "IT WASN'T A SERIOUS PROBLEM"   Gastritis    GERD (gastroesophageal reflux disease)    Gestational diabetes    metformin and lantus insulin   Gestational diabetes mellitus, class A2 07/07/2011   does not normally have diabetes   Headache(784.0)    migraine   Hypertension in pregnancy, preeclampsia, delivered 11/17/2020   Kidney stones    OCD (obsessive compulsive disorder)    Pregnancy induced hypertension    nifedipine   Tremor    "familial tremor"   Past Surgical History:  Procedure Laterality Date   CYSTOSCOPY W/ URETERAL STENT PLACEMENT Left 09/22/2015   Procedure: CYSTOSCOPY WITH LEFT  RETROGRADE PYELOGRAM/ STENT PLACEMENT;  Surgeon: Carolan Clines, MD;  Location: [facility];  Service: Urology;  Laterality: Left;   CYSTOSCOPY WITH RETROGRADE PYELOGRAM, URETEROSCOPY AND STENT PLACEMENT Bilateral 02/27/2022   Procedure: CYSTOSCOPY WITH RETROGRADE PYELOGRAM, URETEROSCOPY AND STENT PLACEMENT;  Surgeon: Robley Fries, MD;  Location: [facility];  Service: Urology;  Laterality: Bilateral;   HOLMIUM LASER APPLICATION Bilateral 12/22/1222   Procedure: HOLMIUM LASER APPLICATION;  Surgeon: Robley Fries,  MD;  Location: [facility];  Service: Urology;  Laterality: Bilateral;   LITHOTRIPSY     MOUTH SURGERY     wisdom teeth and gum x2   Patient Active Problem List   Diagnosis Date Noted   Thyroid enlargement 09/20/2021   Post-nasal drip 09/20/2021   Dysphagia 09/20/2021   Infected sebaceous cyst 06/13/2021   Superficial venous thrombosis of upper extremity, right 06/13/2021   Lactating mother 06/13/2021   Family history of DVT 06/13/2021   Family history of pulmonary embolism 06/13/2021   Hypertension in pregnancy, preeclampsia, delivered 11/17/2020   Postpartum care following vaginal delivery 11/17/2020   Encounter for elective induction of labor 11/16/2020   Dysuria 12/04/2019   Right wrist pain 11/06/2019   Perennial allergic rhinitis 11/06/2019   Palpitations 09/11/2019   Hair loss 05/22/2019   Screening for hyperlipidemia 04/27/2019   Cubital tunnel syndrome, left 02/24/2019   DDD (degenerative disc disease), lumbar 02/03/2019   Allergic conjunctivitis with xerophthalmia 01/22/2019   LPRD (laryngopharyngeal reflux disease) 11/25/2017   Not immune to hepatitis B virus 05/03/2016   Immunity status testing 05/01/2016   Migraine headache 07/07/2015   Dyspepsia 06/09/2015   Annual physical exam 05/12/2015   Left nephrolithiasis 05/12/2015   Essential tremor 05/12/2015   Mood disorder ([place]) 05/12/2015   Microcytosis 05/12/2015   Insulin controlled White classification A2 gestational diabetes mellitus (GDM) 07/07/2011    REFERRING DIAG: N81.9 (ICD-10-CM) -  Female genital prolapse, unspecified 01/31/2022  THERAPY DIAG:  Other muscle spasm  Muscle weakness (generalized)  Unspecified lack of coordination  Abnormal posture  PERTINENT HISTORY: G4P4  PRECAUTIONS: NA  SUBJECTIVE: Pt states that she just returned from whirlwind weekend trip to [place]. She started having Rt hip pain and mild sciatica. She denies any tailbone pain. She has had more issues with incontinence - she  had episode of urge incontinence yesterday and was surprised. She has only been working on stretches, not on strengthening.   PAIN:  Are you having pain? No   SUBJECTIVE 02/13/22:    Pt states that she is overall doing well with no changes. She continues to deny any coccydynia or Rt sided buttock/SIJ pain. She does still have stress urinary incontinence and pelvic pressure, but not as severe as before.                                                                                                                                                                                           PAIN:  Are you having pain? No       OBJECTIVE 03/28/22: Pelvic floor strength 2/5, endurance 2 seconds, repeat contractions 8x; tone WNL; anterior vaginal wall laxity grade 1; mild tenderness deep Rt pelvic floor  02/13/22: *No internal pelvic exam performed for updated findings today due to patient preference while she is on menstrual cycle.    Lumbar A/ROM: flexion WNL, extension 50% with pain in Rt buttock, bil side bend 75% with pull in low back; 75% bil rotation   Squat: WNL   Hip strength: Rt hip flexion/IR/ER 4/5, Lt hip flexion/IR/ER 5/5, Rt hip abduction 3/5, Lt hip adduction 4/5, Bil adduction 4/5, Rt extension 2/5 with pain, Lt extension 3/5   ASLR: (-) bil   Palpation: tenderness in Rt glutes/Rt SIJ     TODAY'S TREATMENT 03/28/22: Manual: Internal pelvic floor techniques: No emotional/communication barriers or cognitive limitation. Patient is motivated to learn. Patient understands and agrees with treatment goals and plan. PT explains patient will be examined in standing, sitting, and lying down to see how their muscles and joints work. When they are ready, they will be asked to remove their underwear so PT can examine their perineum. The patient is also given the option of providing their own chaperone as one is not provided in our facility. The patient also has the right and is explained the  right to defer or refuse any part of the evaluation or treatment including the internal exam. With the patient's consent, PT will use one gloved finger to gently assess the muscles of the pelvic floor, seeing how  well it contracts and relaxes and if there is muscle symmetry. After, the patient will get dressed and PT and patient will discuss exam findings and plan of care. PT and patient discuss plan of care, schedule, attendance policy and HEP activities. Rt deep pelvic floor release vaginally Neuromuscular re-education: Pelvic floor contraction training: Quick flicks 70V Long holds 5 x 10 sec holds Self-care: Urge suppression technique   TREATMENT 03/13/22: Neuromuscular re-education: Core facilitation: Bil UE extensions in seated green band, 2 x 10 Chop bil seated with 3 lb weight 10x bil Seated anterior weight hold with march 2 x 10 2 lb weight Exercises: Stretches/mobility: Hamstring stretch 2 x 60 sec bil Piriformis stretch seated 60 sec bil Seated side bend 10x bil Strengthening: Seated clam shell blue band 2 x 10 Deadlift 5lb weight 2 x 10 Self-care: Urge suppression technique  Strict bladder retraining   TREATMENT 03/07/22: Exercises: Stretches/mobility: Open books 10x bil Lower trunk rotation 3 x 10 Cat/cow 2 x10 Child's pose Forward fold stretch on table 2 x 2 min Standing hip circles 2 x 10 bil Self-care: Urge suppression technique   TREATMENT 02/13/22: Manual: Soft tissue mobilization: Scar tissue mobilization: Myofascial release: Spinal mobilization: Internal pelvic floor techniques: Dry needling: Neuromuscular re-education: Core retraining:  Core facilitation: Standing Wood chop with 4lb 2 x 10 bil Marching on airex 3 x 10 3-way kick 10x each bil Form correction: Pelvic floor contraction training: Down training: Exercises: Stretches/mobility: Seated/propped piriformis 60 sec bil Crescent lunge windmill 10x bil Strengthening: Sidestepping 2  laps red band Therapeutic activities: Functional strengthening activities: Self-care:         PATIENT EDUCATION:  Education details: see above self-care Person educated: Patient Education method: Explanation, Demonstration, Tactile cues, Verbal cues, and Handouts Education comprehension: verbalized understanding     HOME EXERCISE PROGRAM: [id number]    ASSESSMENT:   CLINICAL IMPRESSION: Pt is overall doing very well in recovery from surgery and is doing frequent stretches. However, she has not returned to strengthening and is experiencing more urinary incontinence. Believe that lack of strengthening is related to increase in urinary leaking. Pelvic floor muscle exam performed this session to re-evaluate strength, coordination and tension; she demonstrated no abnormal pelvic floor muscle tone, but one mildly tender spot in deep Rt pelvic floor that quickly released. Strength had good coordination with breath control, but only 2/5 with 2 second endurance and 8 repeat contractions. With multimodal cues to help improve strength of contraction, she did very well and was able to achieve several contractions at 3/5 strength. When performing several long holds in a row, she did start to loose ability to fully relaxed and we discussed the importance of this. HEP updated with pelvic floor strengthening.  She will continue to benefit from skilled PT intervention in order to progress functional strengthening, perform manual techniques for pain management, and work towards goal completion.     OBJECTIVE IMPAIRMENTS decreased activity tolerance, decreased coordination, decreased endurance, decreased mobility, decreased ROM, decreased strength, hypomobility, increased fascial restrictions, increased muscle spasms, impaired flexibility, impaired tone, postural dysfunction, and pain.    ACTIVITY LIMITATIONS  prolonged sitting, standing, intercourse .    PERSONAL FACTORS 1 comorbidity: G4P4  are also  affecting patient's functional outcome.      REHAB POTENTIAL: Good   CLINICAL DECISION MAKING: Stable/uncomplicated   EVALUATION COMPLEXITY: Low     GOALS: Goals reviewed with patient? Yes   SHORT TERM GOALS: Target date: 12/06/21   Pt will be independent with HEP.  Baseline: Goal status: MET   2.  Pt will demonstrate diaphragmatic breathing with appropriate rib and abdominal mobility in order to incrase pelvic floor A/ROM and relaxation Baseline:  Goal status: MET     LONG TERM GOALS: Target date: 01/31/22   Pt will be independent with advanced HEP.    Baseline: Pt working on being more compliant with HEP Goal status: IN PROGRESS   2.  Pt will demonstrate 4/5 pelvic floor muscle strength with appropriate coordination and relaxation in order to decrease urinary incontinence and provide lumbopelvic stability Baseline: Pt has not been working on pelvic floor strengthening due to tension and pain Goal status: IN PROGRESS   3.  Pt will report no more than one episode of coccydynia/LBP a week with pain levels not exceeding 1/10 in order to perform work and functional activities without difficulty Baseline:  Goal status: MET     PLAN: PT FREQUENCY: 1x/week   PT DURATION: 12 weeks   PLANNED INTERVENTIONS: Therapeutic exercises, Therapeutic activity, Neuromuscular re-education, Balance training, Gait training, Patient/Family education, Joint mobilization, Dry Needling, Biofeedback, and Manual therapy   PLAN FOR NEXT SESSION: Return to core training and gentle progressions.    Maripat Roberts, PT, DPT06/04/2311:32 PM

## 2022-03-28 NOTE — Patient Instructions (Signed)
Urge suppression technique: A technique to help you hold urine until it's an appropriate time to go, whether this is making it home or trying to reach a specific voiding time frame according to your schedule. It helps to send signals from the bladder to the brain that say you don't actually have to void urine right now. This most likely only give you several minutes of relief at first, but repeat as needed; the benefit will last longer as you use this technique more and get into better bladder habits. ?  The technique: o Perform 5 quick flicks (Kegels) rapidly, not worrying about fully relaxing in between each (only in this technique). o Then perform several deep belly breaths while focusing on relaxing the pelvic floor. o Go do something else to help distract yourself from the urge to urinate. o Repeat as needed.   [facility] [address], [address] [place], [place] [address] Phone # [phone] Fax [phone]  

## 2022-04-05 ENCOUNTER — Ambulatory Visit: Payer: 59

## 2022-04-05 DIAGNOSIS — M62838 Other muscle spasm: Secondary | ICD-10-CM

## 2022-04-05 DIAGNOSIS — R293 Abnormal posture: Secondary | ICD-10-CM

## 2022-04-05 DIAGNOSIS — R279 Unspecified lack of coordination: Secondary | ICD-10-CM

## 2022-04-05 DIAGNOSIS — M6281 Muscle weakness (generalized): Secondary | ICD-10-CM

## 2022-04-05 NOTE — Therapy (Signed)
OUTPATIENT PHYSICAL THERAPY TREATMENT NOTE   Patient Name: Charlotte Nelson MRN: [id number] DOB:03-11-1981, 41 y.o., female Today's Date: 04/05/2022  PCP: Silverio Decamp, MD REFERRING PROVIDER: Brien Few MD  END OF SESSION:   PT End of Session - 04/05/22 1150     Visit Number 11    Date for PT Re-Evaluation 05/08/22    Authorization Type UHC    PT Start Time 4315    PT Stop Time 1225    PT Time Calculation (min) 40 min    Activity Tolerance Patient tolerated treatment well    Behavior During Therapy WFL for tasks assessed/performed              Past Medical History:  Diagnosis Date   Alopecia    Anemia    not currently   Anxiety    Depression    Family history of adverse reaction to anesthesia    PATERNAL GRANDFATHER HAD UNKNOWN PROBLEM  MANY YRS AGO - "IT WASN'T A SERIOUS PROBLEM"   Gastritis    GERD (gastroesophageal reflux disease)    Gestational diabetes    metformin and lantus insulin   Gestational diabetes mellitus, class A2 07/07/2011   does not normally have diabetes   Headache(784.0)    migraine   Hypertension in pregnancy, preeclampsia, delivered 11/17/2020   Kidney stones    OCD (obsessive compulsive disorder)    Pregnancy induced hypertension    nifedipine   Tremor    "familial tremor"   Past Surgical History:  Procedure Laterality Date   CYSTOSCOPY W/ URETERAL STENT PLACEMENT Left 09/22/2015   Procedure: CYSTOSCOPY WITH LEFT  RETROGRADE PYELOGRAM/ STENT PLACEMENT;  Surgeon: Carolan Clines, MD;  Location: [facility];  Service: Urology;  Laterality: Left;   CYSTOSCOPY WITH RETROGRADE PYELOGRAM, URETEROSCOPY AND STENT PLACEMENT Bilateral 02/27/2022   Procedure: CYSTOSCOPY WITH RETROGRADE PYELOGRAM, URETEROSCOPY AND STENT PLACEMENT;  Surgeon: Robley Fries, MD;  Location: [facility];  Service: Urology;  Laterality: Bilateral;   HOLMIUM LASER APPLICATION Bilateral 4/0/0867   Procedure: HOLMIUM LASER APPLICATION;  Surgeon: Robley Fries, MD;  Location: [facility];  Service: Urology;  Laterality: Bilateral;   LITHOTRIPSY     MOUTH SURGERY     wisdom teeth and gum x2   Patient Active Problem List   Diagnosis Date Noted   Thyroid enlargement 09/20/2021   Post-nasal drip 09/20/2021   Dysphagia 09/20/2021   Infected sebaceous cyst 06/13/2021   Superficial venous thrombosis of upper extremity, right 06/13/2021   Lactating mother 06/13/2021   Family history of DVT 06/13/2021   Family history of pulmonary embolism 06/13/2021   Hypertension in pregnancy, preeclampsia, delivered 11/17/2020   Postpartum care following vaginal delivery 11/17/2020   Encounter for elective induction of labor 11/16/2020   Dysuria 12/04/2019   Right wrist pain 11/06/2019   Perennial allergic rhinitis 11/06/2019   Palpitations 09/11/2019   Hair loss 05/22/2019   Screening for hyperlipidemia 04/27/2019   Cubital tunnel syndrome, left 02/24/2019   DDD (degenerative disc disease), lumbar 02/03/2019   Allergic conjunctivitis with xerophthalmia 01/22/2019   LPRD (laryngopharyngeal reflux disease) 11/25/2017   Not immune to hepatitis B virus 05/03/2016   Immunity status testing 05/01/2016   Migraine headache 07/07/2015   Dyspepsia 06/09/2015   Annual physical exam 05/12/2015   Left nephrolithiasis 05/12/2015   Essential tremor 05/12/2015   Mood disorder ([place]) 05/12/2015   Microcytosis 05/12/2015   Insulin controlled White classification A2 gestational diabetes mellitus (GDM) 07/07/2011    REFERRING DIAG: N81.9 (ICD-10-CM) -  Female genital prolapse, unspecified 01/31/2022  THERAPY DIAG:  Other muscle spasm  Muscle weakness (generalized)  Unspecified lack of coordination  Abnormal posture  PERTINENT HISTORY: G4P4  PRECAUTIONS: NA  SUBJECTIVE:   PAIN:  Are you having pain? No   SUBJECTIVE 02/13/22:    Patient states that she is still having some urgency issues and is not noticing any change. She has had several episodes where she is  right by a bathroom and barely made it. She is having a little but of burning with urination since last week and happens when she has a lot of urgency. She has appointment with urologist tomorrow.                                                                                                                                                                                     PAIN:  Are you having pain? No       OBJECTIVE 03/28/22: Pelvic floor strength 2/5, endurance 2 seconds, repeat contractions 8x; tone WNL; anterior vaginal wall laxity grade 1; mild tenderness deep Rt pelvic floor  02/13/22: *No internal pelvic exam performed for updated findings today due to patient preference while she is on menstrual cycle.    Lumbar A/ROM: flexion WNL, extension 50% with pain in Rt buttock, bil side bend 75% with pull in low back; 75% bil rotation   Squat: WNL   Hip strength: Rt hip flexion/IR/ER 4/5, Lt hip flexion/IR/ER 5/5, Rt hip abduction 3/5, Lt hip adduction 4/5, Bil adduction 4/5, Rt extension 2/5 with pain, Lt extension 3/5   ASLR: (-) bil   Palpation: tenderness in Rt glutes/Rt SIJ     TODAY'S TREATMENT 04/05/22 Neuromuscular re-education: Core retraining:  Transversus abdominus training with multimodal cues for improved motor control and breath coordination Core facilitation: Ball squeeze in seated with pelvic floor/core contraction, 10 x 5 sec Seated resisted march 2 x 10  Exercises: Stretches/mobility: Strengthening: Seated clam shell with blue band, 2 x 10 Seated horizontal abduction/extension, green band, 2 x 10 Squats with breath/core/pelvic floor coordination 2 x 10   TREATMENT 03/28/22: Manual: Internal pelvic floor techniques: No emotional/communication barriers or cognitive limitation. Patient is motivated to learn. Patient understands and agrees with treatment goals and plan. PT explains patient will be examined in standing, sitting, and lying down to see how their  muscles and joints work. When they are ready, they will be asked to remove their underwear so PT can examine their perineum. The patient is also given the option of providing their own chaperone as one is not provided in our facility. The patient also has the right and is explained the right to defer or  refuse any part of the evaluation or treatment including the internal exam. With the patient's consent, PT will use one gloved finger to gently assess the muscles of the pelvic floor, seeing how well it contracts and relaxes and if there is muscle symmetry. After, the patient will get dressed and PT and patient will discuss exam findings and plan of care. PT and patient discuss plan of care, schedule, attendance policy and HEP activities. Rt deep pelvic floor release vaginally Neuromuscular re-education: Pelvic floor contraction training: Quick flicks 82N Long holds 5 x 10 sec holds Self-care: Urge suppression technique   TREATMENT 03/13/22: Neuromuscular re-education: Core facilitation: Bil UE extensions in seated green band, 2 x 10 Chop bil seated with 3 lb weight 10x bil Seated anterior weight hold with march 2 x 10 2 lb weight Exercises: Stretches/mobility: Hamstring stretch 2 x 60 sec bil Piriformis stretch seated 60 sec bil Seated side bend 10x bil Strengthening: Seated clam shell blue band 2 x 10 Deadlift 5lb weight 2 x 10 Self-care: Urge suppression technique  Strict bladder retraining    PATIENT EDUCATION:  Education details: Exercise progressions Person educated: Patient Education method: Explanation, Demonstration, Tactile cues, Verbal cues, and Handouts Education comprehension: verbalized understanding     HOME EXERCISE PROGRAM: [id number]    ASSESSMENT:   CLINICAL IMPRESSION: Patient still having urgency that started since recent surgery without any changes. She feels like pelvic floor strengthening is going well. She did well with return to core training today, but  performed in seated since supine activities tend to flare up low back pain/Rt sided SIJ pain. She did very well with core activation and incorporating pelvic floor/core into more functional activities with good breath coordination. She is still having difficulty with HEP compliance and was encouraged to just work on resisted march for core activation at home. She reported no increase in symptoms at end of session. She will continue to benefit from skilled PT intervention in order to progress functional strengthening, perform manual techniques for pain management, and work towards goal completion.     OBJECTIVE IMPAIRMENTS decreased activity tolerance, decreased coordination, decreased endurance, decreased mobility, decreased ROM, decreased strength, hypomobility, increased fascial restrictions, increased muscle spasms, impaired flexibility, impaired tone, postural dysfunction, and pain.    ACTIVITY LIMITATIONS  prolonged sitting, standing, intercourse .    PERSONAL FACTORS 1 comorbidity: G4P4  are also affecting patient's functional outcome.      REHAB POTENTIAL: Good   CLINICAL DECISION MAKING: Stable/uncomplicated   EVALUATION COMPLEXITY: Low     GOALS: Goals reviewed with patient? Yes   SHORT TERM GOALS: Target date: 12/06/21 - updated 04/05/22   Pt will be independent with HEP.    Baseline: Goal status: MET   2.  Pt will demonstrate diaphragmatic breathing with appropriate rib and abdominal mobility in order to incrase pelvic floor A/ROM and relaxation Baseline:  Goal status: MET     LONG TERM GOALS: Target date: 01/31/22 -updated 04/05/22   Pt will be independent with advanced HEP.    Baseline: Pt working on being more compliant with HEP Goal status: IN PROGRESS   2.  Pt will demonstrate 4/5 pelvic floor muscle strength with appropriate coordination and relaxation in order to decrease urinary incontinence and provide lumbopelvic stability Baseline: strength currently  2/5 Goal status: IN PROGRESS   3.  Pt will report no more than one episode of coccydynia/LBP a week with pain levels not exceeding 1/10 in order to perform work and functional activities without difficulty  Baseline:  Goal status: MET     PLAN: PT FREQUENCY: 1x/week   PT DURATION: 12 weeks   PLANNED INTERVENTIONS: Therapeutic exercises, Therapeutic activity, Neuromuscular re-education, Balance training, Gait training, Patient/Family education, Joint mobilization, Dry Needling, Biofeedback, and Manual therapy   PLAN FOR NEXT SESSION: Progress core/functional strengthening.    Ashara Roberts, PT, DPT06/15/2312:27 PM

## 2022-04-18 ENCOUNTER — Ambulatory Visit: Payer: 59

## 2022-04-18 DIAGNOSIS — R279 Unspecified lack of coordination: Secondary | ICD-10-CM

## 2022-04-18 DIAGNOSIS — M62838 Other muscle spasm: Secondary | ICD-10-CM | POA: Diagnosis not present

## 2022-04-18 DIAGNOSIS — M6281 Muscle weakness (generalized): Secondary | ICD-10-CM

## 2022-04-18 NOTE — Therapy (Signed)
OUTPATIENT PHYSICAL THERAPY TREATMENT NOTE   Patient Name: Charlotte Nelson MRN: [id number] DOB:April 10, 1981, 41 y.o., female Today's Date: 04/18/2022  PCP: Silverio Decamp, MD REFERRING PROVIDER: Brien Few MD  END OF SESSION:   PT End of Session - 04/18/22 1147     Visit Number 12    Date for PT Re-Evaluation 05/08/22    Authorization Type [employer]    PT Start Time 0539    PT Stop Time 1225    PT Time Calculation (min) 40 min    Activity Tolerance Patient tolerated treatment well    Behavior During Therapy WFL for tasks assessed/performed              Past Medical History:  Diagnosis Date   Alopecia    Anemia    not currently   Anxiety    Depression    Family history of adverse reaction to anesthesia    PATERNAL GRANDFATHER HAD UNKNOWN PROBLEM  MANY YRS AGO - "IT WASN'T A SERIOUS PROBLEM"   Gastritis    GERD (gastroesophageal reflux disease)    Gestational diabetes    metformin and lantus insulin   Gestational diabetes mellitus, class A2 07/07/2011   does not normally have diabetes   Headache(784.0)    migraine   Hypertension in pregnancy, preeclampsia, delivered 11/17/2020   Kidney stones    OCD (obsessive compulsive disorder)    Pregnancy induced hypertension    nifedipine   Tremor    "familial tremor"   Past Surgical History:  Procedure Laterality Date   CYSTOSCOPY W/ URETERAL STENT PLACEMENT Left 09/22/2015   Procedure: CYSTOSCOPY WITH LEFT  RETROGRADE PYELOGRAM/ STENT PLACEMENT;  Surgeon: Carolan Clines, MD;  Location: [facility];  Service: Urology;  Laterality: Left;   CYSTOSCOPY WITH RETROGRADE PYELOGRAM, URETEROSCOPY AND STENT PLACEMENT Bilateral 02/27/2022   Procedure: CYSTOSCOPY WITH RETROGRADE PYELOGRAM, URETEROSCOPY AND STENT PLACEMENT;  Surgeon: Robley Fries, MD;  Location: [facility];  Service: Urology;  Laterality: Bilateral;   HOLMIUM LASER APPLICATION Bilateral 04/26/7340   Procedure: HOLMIUM LASER APPLICATION;  Surgeon: Robley Fries, MD;  Location: [facility];  Service: Urology;  Laterality: Bilateral;   LITHOTRIPSY     MOUTH SURGERY     wisdom teeth and gum x2   Patient Active Problem List   Diagnosis Date Noted   Thyroid enlargement 09/20/2021   Post-nasal drip 09/20/2021   Dysphagia 09/20/2021   Infected sebaceous cyst 06/13/2021   Superficial venous thrombosis of upper extremity, right 06/13/2021   Lactating mother 06/13/2021   Family history of DVT 06/13/2021   Family history of pulmonary embolism 06/13/2021   Hypertension in pregnancy, preeclampsia, delivered 11/17/2020   Postpartum care following vaginal delivery 11/17/2020   Encounter for elective induction of labor 11/16/2020   Dysuria 12/04/2019   Right wrist pain 11/06/2019   Perennial allergic rhinitis 11/06/2019   Palpitations 09/11/2019   Hair loss 05/22/2019   Screening for hyperlipidemia 04/27/2019   Cubital tunnel syndrome, left 02/24/2019   DDD (degenerative disc disease), lumbar 02/03/2019   Allergic conjunctivitis with xerophthalmia 01/22/2019   LPRD (laryngopharyngeal reflux disease) 11/25/2017   Not immune to hepatitis B virus 05/03/2016   Immunity status testing 05/01/2016   Migraine headache 07/07/2015   Dyspepsia 06/09/2015   Annual physical exam 05/12/2015   Left nephrolithiasis 05/12/2015   Essential tremor 05/12/2015   Mood disorder ([place]) 05/12/2015   Microcytosis 05/12/2015   Insulin controlled White classification A2 gestational diabetes mellitus (GDM) 07/07/2011    REFERRING DIAG: N81.9 (ICD-10-CM) -  Female genital prolapse, unspecified 01/31/2022  THERAPY DIAG:  Other muscle spasm  Muscle weakness (generalized)  Unspecified lack of coordination  PERTINENT HISTORY: G4P4  PRECAUTIONS: NA  SUBJECTIVE: Patient states that she has not done any exercises over the last week since she has been at the beach. Overall she feels about the same. She states that she was very sore after last treatment session.   PAIN:  Are  you having pain? No   SUBJECTIVE 02/13/22:    Patient states that she is still having some urgency issues and is not noticing any change. She has had several episodes where she is right by a bathroom and barely made it. She is having a little but of burning with urination since last week and happens when she has a lot of urgency. She has appointment with urologist tomorrow.                                                                                                                                                                                     PAIN:  Are you having pain? No       OBJECTIVE 03/28/22: Pelvic floor strength 2/5, endurance 2 seconds, repeat contractions 8x; tone WNL; anterior vaginal wall laxity grade 1; mild tenderness deep Rt pelvic floor  02/13/22: *No internal pelvic exam performed for updated findings today due to patient preference while she is on menstrual cycle.    Lumbar A/ROM: flexion WNL, extension 50% with pain in Rt buttock, bil side bend 75% with pull in low back; 75% bil rotation   Squat: WNL   Hip strength: Rt hip flexion/IR/ER 4/5, Lt hip flexion/IR/ER 5/5, Rt hip abduction 3/5, Lt hip adduction 4/5, Bil adduction 4/5, Rt extension 2/5 with pain, Lt extension 3/5   ASLR: (-) bil   Palpation: tenderness in Rt glutes/Rt SIJ     TODAY'S TREATMENT 04/18/22 Neuromuscular re-education: Core retraining:  Transversus abdominus training with multimodal cues for improved motor control and breath coordination Core facilitation: Ball squeeze in seated with pelvic floor/core contraction, 10 x 5 sec Seated resisted march 2 x 10  Exercises: Strengthening: Seated clam shell with green band, 2 x 10 Seated horizontal abduction/extension, red band, 2 x 10 Squats with breath/core/pelvic floor coordination 2 x 10    TREATMENT 04/05/22 Neuromuscular re-education: Core retraining:  Transversus abdominus training with multimodal cues for improved motor control and  breath coordination Core facilitation: Ball squeeze in seated with pelvic floor/core contraction, 10 x 5 sec Seated resisted march 2 x 10  Exercises: Stretches/mobility: Strengthening: Seated clam shell with blue band, 2 x 10 Seated horizontal abduction/extension, green band, 2 x 10 Squats with breath/core/pelvic floor  coordination 2 x 10   TREATMENT 03/28/22: Manual: Internal pelvic floor techniques: No emotional/communication barriers or cognitive limitation. Patient is motivated to learn. Patient understands and agrees with treatment goals and plan. PT explains patient will be examined in standing, sitting, and lying down to see how their muscles and joints work. When they are ready, they will be asked to remove their underwear so PT can examine their perineum. The patient is also given the option of providing their own chaperone as one is not provided in our facility. The patient also has the right and is explained the right to defer or refuse any part of the evaluation or treatment including the internal exam. With the patient's consent, PT will use one gloved finger to gently assess the muscles of the pelvic floor, seeing how well it contracts and relaxes and if there is muscle symmetry. After, the patient will get dressed and PT and patient will discuss exam findings and plan of care. PT and patient discuss plan of care, schedule, attendance policy and HEP activities. Rt deep pelvic floor release vaginally Neuromuscular re-education: Pelvic floor contraction training: Quick flicks 25K Long holds 5 x 10 sec holds Self-care: Urge suppression technique    PATIENT EDUCATION:  Education details: Exercise progressions Person educated: Patient Education method: Explanation, Demonstration, Tactile cues, Verbal cues, and Handouts Education comprehension: verbalized understanding     HOME EXERCISE PROGRAM: [id number]    ASSESSMENT:   CLINICAL IMPRESSION: Patient has not been working  on exercises, but has not seen worsening of dysfunction. Due to significant soreness after last treatment session, focus placed on reviewing exercises performed instead of progressions. She requested making some exercises a little easier so she does not get as sore. She did well with all exercises today with good form and ability to co-contract core/ and pelvic floor with appropriate coordination. She will continue to benefit from skilled PT intervention in order to progress functional strengthening, perform manual techniques for pain management, and work towards goal completion.     OBJECTIVE IMPAIRMENTS decreased activity tolerance, decreased coordination, decreased endurance, decreased mobility, decreased ROM, decreased strength, hypomobility, increased fascial restrictions, increased muscle spasms, impaired flexibility, impaired tone, postural dysfunction, and pain.    ACTIVITY LIMITATIONS  prolonged sitting, standing, intercourse .    PERSONAL FACTORS 1 comorbidity: G4P4  are also affecting patient's functional outcome.      REHAB POTENTIAL: Good   CLINICAL DECISION MAKING: Stable/uncomplicated   EVALUATION COMPLEXITY: Low     GOALS: Goals reviewed with patient? Yes   SHORT TERM GOALS: Target date: 12/06/21 - updated 04/05/22   Pt will be independent with HEP.    Baseline: Goal status: MET   2.  Pt will demonstrate diaphragmatic breathing with appropriate rib and abdominal mobility in order to incrase pelvic floor A/ROM and relaxation Baseline:  Goal status: MET     LONG TERM GOALS: Target date: 01/31/22 -updated 04/05/22   Pt will be independent with advanced HEP.    Baseline: Pt working on being more compliant with HEP Goal status: IN PROGRESS   2.  Pt will demonstrate 4/5 pelvic floor muscle strength with appropriate coordination and relaxation in order to decrease urinary incontinence and provide lumbopelvic stability Baseline: strength currently 2/5 Goal status: IN  PROGRESS   3.  Pt will report no more than one episode of coccydynia/LBP a week with pain levels not exceeding 1/10 in order to perform work and functional activities without difficulty Baseline:  Goal status: MET  PLAN: PT FREQUENCY: 1x/week   PT DURATION: 12 weeks   PLANNED INTERVENTIONS: Therapeutic exercises, Therapeutic activity, Neuromuscular re-education, Balance training, Gait training, Patient/Family education, Joint mobilization, Dry Needling, Biofeedback, and Manual therapy   PLAN FOR NEXT SESSION: Progress core/functional strengthening.    Mehak Roberts, PT, DPT06/28/2312:25 PM

## 2022-04-25 ENCOUNTER — Ambulatory Visit: Payer: 59 | Attending: Obstetrics and Gynecology

## 2022-04-25 DIAGNOSIS — R293 Abnormal posture: Secondary | ICD-10-CM | POA: Diagnosis present

## 2022-04-25 DIAGNOSIS — M6281 Muscle weakness (generalized): Secondary | ICD-10-CM | POA: Diagnosis present

## 2022-04-25 DIAGNOSIS — R279 Unspecified lack of coordination: Secondary | ICD-10-CM | POA: Diagnosis present

## 2022-04-25 DIAGNOSIS — M62838 Other muscle spasm: Secondary | ICD-10-CM | POA: Diagnosis present

## 2022-04-25 NOTE — Therapy (Signed)
OUTPATIENT PHYSICAL THERAPY TREATMENT NOTE   Patient Name: Charlotte Nelson MRN: [id number] DOB:[birth year], 41 y.o., female Today's Date: 04/25/2022  PCP: Silverio Decamp, MD REFERRING PROVIDER: Brien Few MD  END OF SESSION:   PT End of Session - 04/25/22 1148     Visit Number 13    Date for PT Re-Evaluation 05/08/22    Authorization Type [employer]    PT Start Time 4627    PT Stop Time 1225    PT Time Calculation (min) 40 min    Activity Tolerance Patient tolerated treatment well    Behavior During Therapy WFL for tasks assessed/performed               Past Medical History:  Diagnosis Date   Alopecia    Anemia    not currently   Anxiety    Depression    Family history of adverse reaction to anesthesia    PATERNAL GRANDFATHER HAD UNKNOWN PROBLEM  MANY YRS AGO - "IT WASN'T A SERIOUS PROBLEM"   Gastritis    GERD (gastroesophageal reflux disease)    Gestational diabetes    metformin and lantus insulin   Gestational diabetes mellitus, class A2 07/07/2011   does not normally have diabetes   Headache(784.0)    migraine   Hypertension in pregnancy, preeclampsia, delivered 11/17/2020   Kidney stones    OCD (obsessive compulsive disorder)    Pregnancy induced hypertension    nifedipine   Tremor    "familial tremor"   Past Surgical History:  Procedure Laterality Date   CYSTOSCOPY W/ URETERAL STENT PLACEMENT Left 09/22/2015   Procedure: CYSTOSCOPY WITH LEFT  RETROGRADE PYELOGRAM/ STENT PLACEMENT;  Surgeon: Carolan Clines, MD;  Location: [facility];  Service: Urology;  Laterality: Left;   CYSTOSCOPY WITH RETROGRADE PYELOGRAM, URETEROSCOPY AND STENT PLACEMENT Bilateral 02/27/2022   Procedure: CYSTOSCOPY WITH RETROGRADE PYELOGRAM, URETEROSCOPY AND STENT PLACEMENT;  Surgeon: Robley Fries, MD;  Location: [facility];  Service: Urology;  Laterality: Bilateral;   HOLMIUM LASER APPLICATION Bilateral 0/12/5007   Procedure: HOLMIUM LASER APPLICATION;  Surgeon: Robley Fries, MD;  Location: [facility];  Service: Urology;  Laterality: Bilateral;   LITHOTRIPSY     MOUTH SURGERY     wisdom teeth and gum x2   Patient Active Problem List   Diagnosis Date Noted   Thyroid enlargement 09/20/2021   Post-nasal drip 09/20/2021   Dysphagia 09/20/2021   Infected sebaceous cyst 06/13/2021   Superficial venous thrombosis of upper extremity, right 06/13/2021   Lactating mother 06/13/2021   Family history of DVT 06/13/2021   Family history of pulmonary embolism 06/13/2021   Hypertension in pregnancy, preeclampsia, delivered 11/17/2020   Postpartum care following vaginal delivery 11/17/2020   Encounter for elective induction of labor 11/16/2020   Dysuria 12/04/2019   Right wrist pain 11/06/2019   Perennial allergic rhinitis 11/06/2019   Palpitations 09/11/2019   Hair loss 05/22/2019   Screening for hyperlipidemia 04/27/2019   Cubital tunnel syndrome, left 02/24/2019   DDD (degenerative disc disease), lumbar 02/03/2019   Allergic conjunctivitis with xerophthalmia 01/22/2019   LPRD (laryngopharyngeal reflux disease) 11/25/2017   Not immune to hepatitis B virus 05/03/2016   Immunity status testing 05/01/2016   Migraine headache 07/07/2015   Dyspepsia 06/09/2015   Annual physical exam 05/12/2015   Left nephrolithiasis 05/12/2015   Essential tremor 05/12/2015   Mood disorder ([place]) 05/12/2015   Microcytosis 05/12/2015   Insulin controlled White classification A2 gestational diabetes mellitus (GDM) 07/07/2011    REFERRING DIAG: N81.9 (  ICD-10-CM) - Female genital prolapse, unspecified 01/31/2022  THERAPY DIAG:  Other muscle spasm  Muscle weakness (generalized)  Unspecified lack of coordination  Abnormal posture  PERTINENT HISTORY: G4P4  PRECAUTIONS: NA  SUBJECTIVE: Pt states that leaking is the same, urgency is mildly improved, and she has not done any exercises in the last week.   PAIN:  Are you having pain? No   SUBJECTIVE 02/13/22: Patient states that  she is still having some urgency issues and is not noticing any change. She has had several episodes where she is right by a bathroom and barely made it. She is having a little but of burning with urination since last week and happens when she has a lot of urgency. She has appointment with urologist tomorrow.                                                                                                                                                                                     PAIN:  Are you having pain? No       OBJECTIVE 03/28/22: Pelvic floor strength 2/5, endurance 2 seconds, repeat contractions 8x; tone WNL; anterior vaginal wall laxity grade 1; mild tenderness deep Rt pelvic floor  02/13/22: *No internal pelvic exam performed for updated findings today due to patient preference while she is on menstrual cycle.    Lumbar A/ROM: flexion WNL, extension 50% with pain in Rt buttock, bil side bend 75% with pull in low back; 75% bil rotation   Squat: WNL   Hip strength: Rt hip flexion/IR/ER 4/5, Lt hip flexion/IR/ER 5/5, Rt hip abduction 3/5, Lt hip adduction 4/5, Bil adduction 4/5, Rt extension 2/5 with pain, Lt extension 3/5   ASLR: (-) bil   Palpation: tenderness in Rt glutes/Rt SIJ     TODAY'S TREATMENT 04/25/22 Neuromuscular re-education: Core facilitation: Unilateral quadruped knee lift 10x bil Bird-dog 10x bil Standing Bil UE extensions, green band, 2 x 10 Pallof press 10x bil, green band Pallof rotations 10x bil, red band Exercises: Strengthening: Seated clam shell 2 x 10 blue band Seated D2 flexion/extension 2 x 10 bil red band Horizontal abduction in seated, red band, 2 x 10 Deadlift 2 x 10, 10lbs   TREATMENT 04/18/22 Neuromuscular re-education: Core retraining:  Transversus abdominus training with multimodal cues for improved motor control and breath coordination Core facilitation: Ball squeeze in seated with pelvic floor/core contraction, 10 x 5 sec Seated  resisted march 2 x 10  Exercises: Strengthening: Seated clam shell with green band, 2 x 10 Seated horizontal abduction/extension, red band, 2 x 10 Squats with breath/core/pelvic floor coordination 2 x 10    TREATMENT 04/05/22 Neuromuscular re-education: Core retraining:  Transversus abdominus training with multimodal cues for improved motor control and breath coordination Core facilitation: Ball squeeze in seated with pelvic floor/core contraction, 10 x 5 sec Seated resisted march 2 x 10  Exercises: Stretches/mobility: Strengthening: Seated clam shell with blue band, 2 x 10 Seated horizontal abduction/extension, green band, 2 x 10 Squats with breath/core/pelvic floor coordination 2 x 10   PATIENT EDUCATION:  Education details: Exercise progressions; HEP compliance for better improvement in condition Person educated: Patient Education method: Explanation, Demonstration, Tactile cues, Verbal cues, and Handouts Education comprehension: verbalized understanding     HOME EXERCISE PROGRAM: [id number]    ASSESSMENT:   CLINICAL IMPRESSION: Patient is not seeing much change in condition, but is not working on any HEP throughout the week. She states that she understands that she needs to become more regular with exercises in order to see change. However, it is also good coming in to appointments to set aside at least once a week to help improve dysfunction. She had some difficulty with quadruped positions due to wrist pain. She did very well with all functional strengthening progressions today with appropriate challenge. She will continue to benefit from skilled PT intervention in order to progress functional strengthening, perform manual techniques for pain management, and work towards goal completion.     OBJECTIVE IMPAIRMENTS decreased activity tolerance, decreased coordination, decreased endurance, decreased mobility, decreased ROM, decreased strength, hypomobility, increased fascial  restrictions, increased muscle spasms, impaired flexibility, impaired tone, postural dysfunction, and pain.    ACTIVITY LIMITATIONS  prolonged sitting, standing, intercourse .    PERSONAL FACTORS 1 comorbidity: G4P4  are also affecting patient's functional outcome.      REHAB POTENTIAL: Good   CLINICAL DECISION MAKING: Stable/uncomplicated   EVALUATION COMPLEXITY: Low     GOALS: Goals reviewed with patient? Yes   SHORT TERM GOALS: Target date: 12/06/21 - updated 04/05/22   Pt will be independent with HEP.    Baseline: Goal status: MET   2.  Pt will demonstrate diaphragmatic breathing with appropriate rib and abdominal mobility in order to incrase pelvic floor A/ROM and relaxation Baseline:  Goal status: MET     LONG TERM GOALS: Target date: 01/31/22 -updated 04/05/22   Pt will be independent with advanced HEP.    Baseline: Pt working on being more compliant with HEP Goal status: IN PROGRESS   2.  Pt will demonstrate 4/5 pelvic floor muscle strength with appropriate coordination and relaxation in order to decrease urinary incontinence and provide lumbopelvic stability Baseline: strength currently 2/5 Goal status: IN PROGRESS   3.  Pt will report no more than one episode of coccydynia/LBP a week with pain levels not exceeding 1/10 in order to perform work and functional activities without difficulty Baseline:  Goal status: MET     PLAN: PT FREQUENCY: 1x/week   PT DURATION: 12 weeks   PLANNED INTERVENTIONS: Therapeutic exercises, Therapeutic activity, Neuromuscular re-education, Balance training, Gait training, Patient/Family education, Joint mobilization, Dry Needling, Biofeedback, and Manual therapy   PLAN FOR NEXT SESSION: Progress core/functional strengthening.    Bevin Roberts, PT, DPT07/02/2311:28 PM

## 2022-05-02 ENCOUNTER — Ambulatory Visit: Payer: 59

## 2022-05-02 DIAGNOSIS — R279 Unspecified lack of coordination: Secondary | ICD-10-CM

## 2022-05-02 DIAGNOSIS — M6281 Muscle weakness (generalized): Secondary | ICD-10-CM

## 2022-05-02 DIAGNOSIS — M62838 Other muscle spasm: Secondary | ICD-10-CM

## 2022-05-02 DIAGNOSIS — R293 Abnormal posture: Secondary | ICD-10-CM

## 2022-05-02 NOTE — Therapy (Signed)
OUTPATIENT PHYSICAL THERAPY TREATMENT NOTE   Patient Name: Charlotte Nelson MRN: [id number] DOB:1981-03-07, 41 y.o., female Today's Date: 05/02/2022  PCP: Silverio Decamp, MD REFERRING PROVIDER: Brien Few MD  END OF SESSION:   PT End of Session - 05/02/22 1150     Visit Number 14    Date for PT Re-Evaluation 05/08/22    Authorization Type [employer]    PT Start Time 1145    PT Stop Time 1225    PT Time Calculation (min) 40 min    Activity Tolerance Patient tolerated treatment well    Behavior During Therapy WFL for tasks assessed/performed                Past Medical History:  Diagnosis Date   Alopecia    Anemia    not currently   Anxiety    Depression    Family history of adverse reaction to anesthesia    PATERNAL GRANDFATHER HAD UNKNOWN PROBLEM  MANY YRS AGO - "IT WASN'T A SERIOUS PROBLEM"   Gastritis    GERD (gastroesophageal reflux disease)    Gestational diabetes    metformin and lantus insulin   Gestational diabetes mellitus, class A2 07/07/2011   does not normally have diabetes   Headache(784.0)    migraine   Hypertension in pregnancy, preeclampsia, delivered 11/17/2020   Kidney stones    OCD (obsessive compulsive disorder)    Pregnancy induced hypertension    nifedipine   Tremor    "familial tremor"   Past Surgical History:  Procedure Laterality Date   CYSTOSCOPY W/ URETERAL STENT PLACEMENT Left 09/22/2015   Procedure: CYSTOSCOPY WITH LEFT  RETROGRADE PYELOGRAM/ STENT PLACEMENT;  Surgeon: Carolan Clines, MD;  Location: [facility];  Service: Urology;  Laterality: Left;   CYSTOSCOPY WITH RETROGRADE PYELOGRAM, URETEROSCOPY AND STENT PLACEMENT Bilateral 02/27/2022   Procedure: CYSTOSCOPY WITH RETROGRADE PYELOGRAM, URETEROSCOPY AND STENT PLACEMENT;  Surgeon: Robley Fries, MD;  Location: [facility];  Service: Urology;  Laterality: Bilateral;   HOLMIUM LASER APPLICATION Bilateral 02/20/8840   Procedure: HOLMIUM LASER APPLICATION;  Surgeon: Robley Fries, MD;  Location: [facility];  Service: Urology;  Laterality: Bilateral;   LITHOTRIPSY     MOUTH SURGERY     wisdom teeth and gum x2   Patient Active Problem List   Diagnosis Date Noted   Thyroid enlargement 09/20/2021   Post-nasal drip 09/20/2021   Dysphagia 09/20/2021   Infected sebaceous cyst 06/13/2021   Superficial venous thrombosis of upper extremity, right 06/13/2021   Lactating mother 06/13/2021   Family history of DVT 06/13/2021   Family history of pulmonary embolism 06/13/2021   Hypertension in pregnancy, preeclampsia, delivered 11/17/2020   Postpartum care following vaginal delivery 11/17/2020   Encounter for elective induction of labor 11/16/2020   Dysuria 12/04/2019   Right wrist pain 11/06/2019   Perennial allergic rhinitis 11/06/2019   Palpitations 09/11/2019   Hair loss 05/22/2019   Screening for hyperlipidemia 04/27/2019   Cubital tunnel syndrome, left 02/24/2019   DDD (degenerative disc disease), lumbar 02/03/2019   Allergic conjunctivitis with xerophthalmia 01/22/2019   LPRD (laryngopharyngeal reflux disease) 11/25/2017   Not immune to hepatitis B virus 05/03/2016   Immunity status testing 05/01/2016   Migraine headache 07/07/2015   Dyspepsia 06/09/2015   Annual physical exam 05/12/2015   Left nephrolithiasis 05/12/2015   Essential tremor 05/12/2015   Mood disorder ([place]) 05/12/2015   Microcytosis 05/12/2015   Insulin controlled White classification A2 gestational diabetes mellitus (GDM) 07/07/2011    REFERRING DIAG:  N81.9 (ICD-10-CM) - Female genital prolapse, unspecified 01/31/2022  THERAPY DIAG:  Other muscle spasm  Muscle weakness (generalized)  Unspecified lack of coordination  Abnormal posture  PERTINENT HISTORY: G4P4  PRECAUTIONS: NA  SUBJECTIVE: Pt states that she has not had any leaking over the last week, but feels like there haven't been any episodes where her bladder was stressed. She has not had any pain. She was less sore  after last treatment session. She has not been using urge suppression technique - feels like she is going about every 2-3 hours.   PAIN:  Are you having pain? No   SUBJECTIVE 02/13/22: Patient states that she is still having some urgency issues and is not noticing any change. She has had several episodes where she is right by a bathroom and barely made it. She is having a little but of burning with urination since last week and happens when she has a lot of urgency. She has appointment with urologist tomorrow.                                                                                                                                                                                     PAIN:  Are you having pain? No       OBJECTIVE 03/28/22: Pelvic floor strength 2/5, endurance 2 seconds, repeat contractions 8x; tone WNL; anterior vaginal wall laxity grade 1; mild tenderness deep Rt pelvic floor  02/13/22: *No internal pelvic exam performed for updated findings today due to patient preference while she is on menstrual cycle.    Lumbar A/ROM: flexion WNL, extension 50% with pain in Rt buttock, bil side bend 75% with pull in low back; 75% bil rotation   Squat: WNL   Hip strength: Rt hip flexion/IR/ER 4/5, Lt hip flexion/IR/ER 5/5, Rt hip abduction 3/5, Lt hip adduction 4/5, Bil adduction 4/5, Rt extension 2/5 with pain, Lt extension 3/5   ASLR: (-) bil   Palpation: tenderness in Rt glutes/Rt SIJ     TODAY'S TREATMENT 05/02/22 Neuromuscular re-education: Core facilitation: Standing Bil UE extensions, green band, 2 x 10 Pallof press 10x bil, green band Pallof rotations 10x bil, red band Exercises: Stretches/mobility: Kneeling hip flexor stretch 2 min bil Strengthening: Deadlift 2 x 10, 10lbs   TREATMENT 04/25/22 Neuromuscular re-education: Core facilitation: Unilateral quadruped knee lift 10x bil Bird-dog 10x bil Standing Bil UE extensions, green band, 2 x 10 Pallof press 10x  bil, green band Pallof rotations 10x bil, red band  Exercises: Strengthening: Seated clam shell 2 x 10 blue band Seated D2 flexion/extension 2 x 10 bil red band Horizontal abduction in seated, red band, 2 x 10 Deadlift 2 x  10, 10lbs   TREATMENT 04/18/22 Neuromuscular re-education: Core retraining:  Transversus abdominus training with multimodal cues for improved motor control and breath coordination Core facilitation: Ball squeeze in seated with pelvic floor/core contraction, 10 x 5 sec Seated resisted march 2 x 10  Exercises: Strengthening: Seated clam shell with green band, 2 x 10 Seated horizontal abduction/extension, red band, 2 x 10 Squats with breath/core/pelvic floor coordination 2 x 10      PATIENT EDUCATION:  Education details: Exercise progressions; HEP compliance for better improvement in condition Person educated: Patient Education method: Consulting civil engineer, Demonstration, Tactile cues, Verbal cues, and Handouts Education comprehension: verbalized understanding     HOME EXERCISE PROGRAM: 7    ASSESSMENT:   CLINICAL IMPRESSION: Pt overall doing very well with no leaking over the last week and reduced urgency. Feel like she is seeing good improvements in voiding window as well. She has not been performing strengthening at home, but believe weekly PT with focus on strengthening is helpful in overall condition. She tolerated all strengthening well today being able to continue with same resistance as she was using previously. She will continue to benefit from skilled PT intervention in order to progress functional strengthening, perform manual techniques for pain management, and work towards goal completion.     OBJECTIVE IMPAIRMENTS decreased activity tolerance, decreased coordination, decreased endurance, decreased mobility, decreased ROM, decreased strength, hypomobility, increased fascial restrictions, increased muscle spasms, impaired flexibility, impaired  tone, postural dysfunction, and pain.    ACTIVITY LIMITATIONS  prolonged sitting, standing, intercourse .    PERSONAL FACTORS 1 comorbidity: G4P4  are also affecting patient's functional outcome.      REHAB POTENTIAL: Good   CLINICAL DECISION MAKING: Stable/uncomplicated   EVALUATION COMPLEXITY: Low     GOALS: Goals reviewed with patient? Yes   SHORT TERM GOALS: Target date: 12/06/21 - updated 05/02/22   Pt will be independent with HEP.    Baseline: Goal status: MET 05/02/22   2.  Pt will demonstrate diaphragmatic breathing with appropriate rib and abdominal mobility in order to incrase pelvic floor A/ROM and relaxation Baseline:  Goal status: MET 05/02/22     LONG TERM GOALS: Target date: 01/31/22 -updated 05/02/22   Pt will be independent with advanced HEP.    Baseline: Pt working on being more compliant with HEP Goal status: IN PROGRESS   2.  Pt will demonstrate 4/5 pelvic floor muscle strength with appropriate coordination and relaxation in order to decrease urinary incontinence and provide lumbopelvic stability Baseline: strength currently 2/5 Goal status: IN PROGRESS   3.  Pt will report no more than one episode of coccydynia/LBP a week with pain levels not exceeding 1/10 in order to perform work and functional activities without difficulty Baseline: continued to not have any pain  Goal status: MET 05/02/22     PLAN: PT FREQUENCY: 1x/week   PT DURATION: 12 weeks   PLANNED INTERVENTIONS: Therapeutic exercises, Therapeutic activity, Neuromuscular re-education, Balance training, Gait training, Patient/Family education, Joint mobilization, Dry Needling, Biofeedback, and Manual therapy   PLAN FOR NEXT SESSION: Progress core/functional strengthening.    Jemila Roberts, PT, DPT07/09/2311:53 PM

## 2022-05-09 ENCOUNTER — Ambulatory Visit: Payer: 59

## 2022-05-09 DIAGNOSIS — R279 Unspecified lack of coordination: Secondary | ICD-10-CM

## 2022-05-09 DIAGNOSIS — M62838 Other muscle spasm: Secondary | ICD-10-CM | POA: Diagnosis not present

## 2022-05-09 DIAGNOSIS — M6281 Muscle weakness (generalized): Secondary | ICD-10-CM

## 2022-05-09 DIAGNOSIS — R293 Abnormal posture: Secondary | ICD-10-CM

## 2022-05-09 NOTE — Therapy (Signed)
OUTPATIENT PHYSICAL THERAPY TREATMENT NOTE   Patient Name: Charlotte Nelson MRN: [id number] DOB:04/30/1981, 41 y.o., female Today's Date: 05/09/2022  PCP: Silverio Decamp, MD REFERRING PROVIDER: Brien Few MD  END OF SESSION:   PT End of Session - 05/09/22 1148     Visit Number 15    Date for PT Re-Evaluation 05/08/22    Authorization Type [employer]    PT Start Time 9628    PT Stop Time 1225    PT Time Calculation (min) 40 min    Activity Tolerance Patient tolerated treatment well    Behavior During Therapy WFL for tasks assessed/performed                 Past Medical History:  Diagnosis Date   Alopecia    Anemia    not currently   Anxiety    Depression    Family history of adverse reaction to anesthesia    PATERNAL GRANDFATHER HAD UNKNOWN PROBLEM  MANY YRS AGO - "IT WASN'T A SERIOUS PROBLEM"   Gastritis    GERD (gastroesophageal reflux disease)    Gestational diabetes    metformin and lantus insulin   Gestational diabetes mellitus, class A2 07/07/2011   does not normally have diabetes   Headache(784.0)    migraine   Hypertension in pregnancy, preeclampsia, delivered 11/17/2020   Kidney stones    OCD (obsessive compulsive disorder)    Pregnancy induced hypertension    nifedipine   Tremor    "familial tremor"   Past Surgical History:  Procedure Laterality Date   CYSTOSCOPY W/ URETERAL STENT PLACEMENT Left 09/22/2015   Procedure: CYSTOSCOPY WITH LEFT  RETROGRADE PYELOGRAM/ STENT PLACEMENT;  Surgeon: Carolan Clines, MD;  Location: [facility];  Service: Urology;  Laterality: Left;   CYSTOSCOPY WITH RETROGRADE PYELOGRAM, URETEROSCOPY AND STENT PLACEMENT Bilateral 02/27/2022   Procedure: CYSTOSCOPY WITH RETROGRADE PYELOGRAM, URETEROSCOPY AND STENT PLACEMENT;  Surgeon: Robley Fries, MD;  Location: [facility];  Service: Urology;  Laterality: Bilateral;   HOLMIUM LASER APPLICATION Bilateral 12/25/6292   Procedure: HOLMIUM LASER APPLICATION;  Surgeon: Robley Fries, MD;  Location: [facility];  Service: Urology;  Laterality: Bilateral;   LITHOTRIPSY     MOUTH SURGERY     wisdom teeth and gum x2   Patient Active Problem List   Diagnosis Date Noted   Thyroid enlargement 09/20/2021   Post-nasal drip 09/20/2021   Dysphagia 09/20/2021   Infected sebaceous cyst 06/13/2021   Superficial venous thrombosis of upper extremity, right 06/13/2021   Lactating mother 06/13/2021   Family history of DVT 06/13/2021   Family history of pulmonary embolism 06/13/2021   Hypertension in pregnancy, preeclampsia, delivered 11/17/2020   Postpartum care following vaginal delivery 11/17/2020   Encounter for elective induction of labor 11/16/2020   Dysuria 12/04/2019   Right wrist pain 11/06/2019   Perennial allergic rhinitis 11/06/2019   Palpitations 09/11/2019   Hair loss 05/22/2019   Screening for hyperlipidemia 04/27/2019   Cubital tunnel syndrome, left 02/24/2019   DDD (degenerative disc disease), lumbar 02/03/2019   Allergic conjunctivitis with xerophthalmia 01/22/2019   LPRD (laryngopharyngeal reflux disease) 11/25/2017   Not immune to hepatitis B virus 05/03/2016   Immunity status testing 05/01/2016   Migraine headache 07/07/2015   Dyspepsia 06/09/2015   Annual physical exam 05/12/2015   Left nephrolithiasis 05/12/2015   Essential tremor 05/12/2015   Mood disorder ([place]) 05/12/2015   Microcytosis 05/12/2015   Insulin controlled White classification A2 gestational diabetes mellitus (GDM) 07/07/2011    REFERRING  DIAG: N81.9 (ICD-10-CM) - Female genital prolapse, unspecified 01/31/2022  THERAPY DIAG:  Other muscle spasm  Muscle weakness (generalized)  Unspecified lack of coordination  Abnormal posture  PERTINENT HISTORY: G4P4  PRECAUTIONS: NA  SUBJECTIVE: Pt states that she has not done any exercises in the last week. She did have some Lt sided SIJ pain randomly after lying slouched on the couch. Once she switched positions the pain was  gone and has not come back. Urgency is getting better and she states that urge suppression technique is helpful.   PAIN:  Are you having pain? No   SUBJECTIVE 02/13/22: Patient states that she is still having some urgency issues and is not noticing any change. She has had several episodes where she is right by a bathroom and barely made it. She is having a little but of burning with urination since last week and happens when she has a lot of urgency. She has appointment with urologist tomorrow.                                                                                                                                                                                     PAIN:  Are you having pain? No       OBJECTIVE 03/28/22: Pelvic floor strength 2/5, endurance 2 seconds, repeat contractions 8x; tone WNL; anterior vaginal wall laxity grade 1; mild tenderness deep Rt pelvic floor  02/13/22: *No internal pelvic exam performed for updated findings today due to patient preference while she is on menstrual cycle.    Lumbar A/ROM: flexion WNL, extension 50% with pain in Rt buttock, bil side bend 75% with pull in low back; 75% bil rotation   Squat: WNL   Hip strength: Rt hip flexion/IR/ER 4/5, Lt hip flexion/IR/ER 5/5, Rt hip abduction 3/5, Lt hip adduction 4/5, Bil adduction 4/5, Rt extension 2/5 with pain, Lt extension 3/5   ASLR: (-) bil   Palpation: tenderness in Rt glutes/Rt SIJ     TODAY'S TREATMENT 05/09/22 Neuromuscular re-education: Core facilitation: Standing march with anterior weight hold 2 lbs, 2 x 20 Kneeling rainbows 2x 10 5lbs Exercises: Stretches/mobility: Strengthening: Sidestepping 4 laps yellow band Wide stance cross body press 5lb, 10x bil Deadlifts 2 x 10, 10lbs Sumo squats 10x, 10lbs   TREATMENT 05/02/22 Neuromuscular re-education: Core facilitation: Standing Bil UE extensions, green band, 2 x 10 Pallof press 10x bil, green band Pallof rotations 10x bil,  red band Exercises: Stretches/mobility: Kneeling hip flexor stretch 2 min bil Strengthening: Deadlift 2 x 10, 10lbs   TREATMENT 04/25/22 Neuromuscular re-education: Core facilitation: Unilateral quadruped knee lift 10x bil Bird-dog 10x bil Standing Bil UE extensions, green band, 2  x 10 Pallof press 10x bil, green band Pallof rotations 10x bil, red band  Exercises: Strengthening: Seated clam shell 2 x 10 blue band Seated D2 flexion/extension 2 x 10 bil red band Horizontal abduction in seated, red band, 2 x 10 Deadlift 2 x 10, 10lbs     PATIENT EDUCATION:  Education details: Exercise progressions; HEP compliance for better improvement in condition Person educated: Patient Education method: Explanation, Demonstration, Tactile cues, Verbal cues, and Handouts Education comprehension: verbalized understanding     HOME EXERCISE PROGRAM: [id number]    ASSESSMENT:   CLINICAL IMPRESSION: Pt is seeing some improvement and is utilizing urge suppression technique to help with urgency. She did well with exercise progressions today focusing on functional standing strengthening activities. She required moderate cuing for improved breath coordination and core/pelvic floor activation. She will continue to benefit from skilled PT intervention in order to progress functional strengthening, perform manual techniques for pain management, and work towards goal completion.     OBJECTIVE IMPAIRMENTS decreased activity tolerance, decreased coordination, decreased endurance, decreased mobility, decreased ROM, decreased strength, hypomobility, increased fascial restrictions, increased muscle spasms, impaired flexibility, impaired tone, postural dysfunction, and pain.    ACTIVITY LIMITATIONS  prolonged sitting, standing, intercourse .    PERSONAL FACTORS 1 comorbidity: G4P4  are also affecting patient's functional outcome.      REHAB POTENTIAL: Good   CLINICAL DECISION MAKING:  Stable/uncomplicated   EVALUATION COMPLEXITY: Low     GOALS: Goals reviewed with patient? Yes   SHORT TERM GOALS: Target date: 12/06/21 - updated 05/02/22   Pt will be independent with HEP.    Baseline: Goal status: MET 05/02/22   2.  Pt will demonstrate diaphragmatic breathing with appropriate rib and abdominal mobility in order to incrase pelvic floor A/ROM and relaxation Baseline:  Goal status: MET 05/02/22     LONG TERM GOALS: Target date: 01/31/22 -updated 05/02/22   Pt will be independent with advanced HEP.    Baseline: Pt working on being more compliant with HEP Goal status: IN PROGRESS   2.  Pt will demonstrate 4/5 pelvic floor muscle strength with appropriate coordination and relaxation in order to decrease urinary incontinence and provide lumbopelvic stability Baseline: strength currently 2/5 Goal status: IN PROGRESS   3.  Pt will report no more than one episode of coccydynia/LBP a week with pain levels not exceeding 1/10 in order to perform work and functional activities without difficulty Baseline: continued to not have any pain  Goal status: MET 05/02/22     PLAN: PT FREQUENCY: 1x/week   PT DURATION: 12 weeks   PLANNED INTERVENTIONS: Therapeutic exercises, Therapeutic activity, Neuromuscular re-education, Balance training, Gait training, Patient/Family education, Joint mobilization, Dry Needling, Biofeedback, and Manual therapy   PLAN FOR NEXT SESSION: Progress core/functional strengthening.    Jeanine Roberts, PT, DPT07/19/2311:49 AM

## 2022-05-17 ENCOUNTER — Ambulatory Visit: Payer: 59

## 2022-05-17 DIAGNOSIS — R293 Abnormal posture: Secondary | ICD-10-CM

## 2022-05-17 DIAGNOSIS — M62838 Other muscle spasm: Secondary | ICD-10-CM

## 2022-05-17 DIAGNOSIS — R279 Unspecified lack of coordination: Secondary | ICD-10-CM

## 2022-05-17 DIAGNOSIS — M6281 Muscle weakness (generalized): Secondary | ICD-10-CM

## 2022-05-17 NOTE — Therapy (Addendum)
OUTPATIENT PHYSICAL THERAPY TREATMENT NOTE   Patient Name: Charlotte Nelson MRN: [id number] DOB:1981-10-20, 41 y.o., female Today's Date: 05/17/2022  PCP: Silverio Decamp, MD REFERRING PROVIDER: Brien Few MD  END OF SESSION:   PT End of Session - 05/17/22 1152     Visit Number 16    Date for PT Re-Evaluation 05/31/22    Authorization Type UHC    PT Start Time 1148    PT Stop Time 1226    PT Time Calculation (min) 38 min    Activity Tolerance Patient tolerated treatment well    Behavior During Therapy WFL for tasks assessed/performed                  Past Medical History:  Diagnosis Date   Alopecia    Anemia    not currently   Anxiety    Depression    Family history of adverse reaction to anesthesia    PATERNAL GRANDFATHER HAD UNKNOWN PROBLEM  MANY YRS AGO - "IT WASN'T A SERIOUS PROBLEM"   Gastritis    GERD (gastroesophageal reflux disease)    Gestational diabetes    metformin and lantus insulin   Gestational diabetes mellitus, class A2 07/07/2011   does not normally have diabetes   Headache(784.0)    migraine   Hypertension in pregnancy, preeclampsia, delivered 11/17/2020   Kidney stones    OCD (obsessive compulsive disorder)    Pregnancy induced hypertension    nifedipine   Tremor    "familial tremor"   Past Surgical History:  Procedure Laterality Date   CYSTOSCOPY W/ URETERAL STENT PLACEMENT Left 09/22/2015   Procedure: CYSTOSCOPY WITH LEFT  RETROGRADE PYELOGRAM/ STENT PLACEMENT;  Surgeon: Carolan Clines, MD;  Location: [facility];  Service: Urology;  Laterality: Left;   CYSTOSCOPY WITH RETROGRADE PYELOGRAM, URETEROSCOPY AND STENT PLACEMENT Bilateral 02/27/2022   Procedure: CYSTOSCOPY WITH RETROGRADE PYELOGRAM, URETEROSCOPY AND STENT PLACEMENT;  Surgeon: Robley Fries, MD;  Location: [facility];  Service: Urology;  Laterality: Bilateral;   HOLMIUM LASER APPLICATION Bilateral 01/28/6758   Procedure: HOLMIUM LASER APPLICATION;  Surgeon: Robley Fries, MD;  Location: [facility];  Service: Urology;  Laterality: Bilateral;   LITHOTRIPSY     MOUTH SURGERY     wisdom teeth and gum x2   Patient Active Problem List   Diagnosis Date Noted   Thyroid enlargement 09/20/2021   Post-nasal drip 09/20/2021   Dysphagia 09/20/2021   Infected sebaceous cyst 06/13/2021   Superficial venous thrombosis of upper extremity, right 06/13/2021   Lactating mother 06/13/2021   Family history of DVT 06/13/2021   Family history of pulmonary embolism 06/13/2021   Hypertension in pregnancy, preeclampsia, delivered 11/17/2020   Postpartum care following vaginal delivery 11/17/2020   Encounter for elective induction of labor 11/16/2020   Dysuria 12/04/2019   Right wrist pain 11/06/2019   Perennial allergic rhinitis 11/06/2019   Palpitations 09/11/2019   Hair loss 05/22/2019   Screening for hyperlipidemia 04/27/2019   Cubital tunnel syndrome, left 02/24/2019   DDD (degenerative disc disease), lumbar 02/03/2019   Allergic conjunctivitis with xerophthalmia 01/22/2019   LPRD (laryngopharyngeal reflux disease) 11/25/2017   Not immune to hepatitis B virus 05/03/2016   Immunity status testing 05/01/2016   Migraine headache 07/07/2015   Dyspepsia 06/09/2015   Annual physical exam 05/12/2015   Left nephrolithiasis 05/12/2015   Essential tremor 05/12/2015   Mood disorder ([place]) 05/12/2015   Microcytosis 05/12/2015   Insulin controlled White classification A2 gestational diabetes mellitus (GDM) 07/07/2011  REFERRING DIAG: N81.9 (ICD-10-CM) - Female genital prolapse, unspecified 01/31/2022  THERAPY DIAG:  Other muscle spasm  Muscle weakness (generalized)  Unspecified lack of coordination  Abnormal posture  PERTINENT HISTORY: G4P4  PRECAUTIONS: NA  SUBJECTIVE: Pt states that she has been having more Lt sided pain in the last week. She states that the pain is so mild she has trouble remembering when it occurs, but possibly with bending over. She  got on treadmill one night in the last week, but was not able to perform any exercises. She feels like urgency and leaking are slowly improving. She is naturally going to the bathroom every 3 hours without having to try hard to wait or having urgency.   PAIN:  Are you having pain? No   SUBJECTIVE 02/13/22: Patient states that she is still having some urgency issues and is not noticing any change. She has had several episodes where she is right by a bathroom and barely made it. She is having a little but of burning with urination since last week and happens when she has a lot of urgency. She has appointment with urologist tomorrow.                                                                                                                                                                                     PAIN:  Are you having pain? No       OBJECTIVE 7/27/223:  Lumbar A/ROM: All lumbar A/ROM WNL Hip strength: Hip flexion/IR/ER WNL bil with some pain in Rt hip with resisted ER; Rt hip abduction 4/5, Lt hip abduction 5/5; Bil hip adduction 4/5; Bil hip extension 4-/5 Decreased hip anterior/posterior hip flexibility  03/28/22: Pelvic floor strength 2/5, endurance 2 seconds, repeat contractions 8x; tone WNL; anterior vaginal wall laxity grade 1; mild tenderness deep Rt pelvic floor  02/13/22: *No internal pelvic exam performed for updated findings today due to patient preference while she is on menstrual cycle.    Lumbar A/ROM: flexion WNL, extension 50% with pain in Rt buttock, bil side bend 75% with pull in low back; 75% bil rotation   Squat: WNL   Hip strength: Rt hip flexion/IR/ER 4/5, Lt hip flexion/IR/ER 5/5, Rt hip abduction 3/5, Lt hip adduction 4/5, Bil adduction 4/5, Rt extension 2/5 with pain, Lt extension 3/5   ASLR: (-) bil   Palpation: tenderness in Rt glutes/Rt SIJ     TODAY'S TREATMENT 05/17/22 RE-EVAL Self-care: In detail HEP review   TREATMENT  05/09/22 Neuromuscular re-education: Core facilitation: Standing march with anterior weight hold 2 lbs, 2 x 20 Kneeling rainbows 2x 10 5lbs Exercises: Stretches/mobility: Strengthening: Sidestepping 4 laps yellow band Wide  stance cross body press 5lb, 10x bil Deadlifts 2 x 10, 10lbs Sumo squats 10x, 10lbs   TREATMENT 05/02/22 Neuromuscular re-education: Core facilitation: Standing Bil UE extensions, green band, 2 x 10 Pallof press 10x bil, green band Pallof rotations 10x bil, red band Exercises: Stretches/mobility: Kneeling hip flexor stretch 2 min bil Strengthening: Deadlift 2 x 10, 10lbs    PATIENT EDUCATION:  Education details: Exercise progressions; HEP compliance for better improvement in condition Person educated: Patient Education method: Consulting civil engineer, Demonstration, Tactile cues, Verbal cues, and Handouts Education comprehension: verbalized understanding     HOME EXERCISE PROGRAM: [id number]    ASSESSMENT:   CLINICAL IMPRESSION: Pt overall making good progress with no tailbone pain, reduced Rt SIJ pain, and improving urinary urgency/incontinence. She has started having intermittent Lt sided hip/SIJ pain that is most likely due to continued hip weakness. She has seen great improvements in Bil hip strength even though she is having difficulty with HEP compliance. We spent time today  reviewing what she should be focusing on for pelvic floor strengthening in addition to other whole body strengthening to improve pelvic stability/decrease pain long term. We will plan to have one more visit for final HEP progressions, and then patient will see how she does with HEP compliance on her own.      OBJECTIVE IMPAIRMENTS decreased activity tolerance, decreased coordination, decreased endurance, decreased mobility, decreased ROM, decreased strength, hypomobility, increased fascial restrictions, increased muscle spasms, impaired flexibility, impaired tone, postural dysfunction, and  pain.    ACTIVITY LIMITATIONS  prolonged sitting, standing, intercourse .    PERSONAL FACTORS 1 comorbidity: G4P4  are also affecting patient's functional outcome.      REHAB POTENTIAL: Good   CLINICAL DECISION MAKING: Stable/uncomplicated   EVALUATION COMPLEXITY: Low     GOALS: Goals reviewed with patient? Yes   SHORT TERM GOALS: Target date: 12/06/21 - updated 05/02/22   Pt will be independent with HEP.    Baseline: Goal status: MET 05/02/22   2.  Pt will demonstrate diaphragmatic breathing with appropriate rib and abdominal mobility in order to incrase pelvic floor A/ROM and relaxation Baseline:  Goal status: MET 05/02/22     LONG TERM GOALS: Target date: 01/31/22 -updated 05/02/22   Pt will be independent with advanced HEP.    Baseline: Pt working on being more compliant with HEP Goal status: IN PROGRESS   2.  Pt will demonstrate 4/5 pelvic floor muscle strength with appropriate coordination and relaxation in order to decrease urinary incontinence and provide lumbopelvic stability Baseline: strength currently 2/5 - not assessed formally today 05/17/22 Goal status: IN PROGRESS   3.  Pt will report no more than one episode of coccydynia/LBP a week with pain levels not exceeding 1/10 in order to perform work and functional activities without difficulty Baseline: continued to not have any pain  Goal status: MET 05/02/22     PLAN: PT FREQUENCY: 1x/week   PT DURATION: 12 weeks   PLANNED INTERVENTIONS: Therapeutic exercises, Therapeutic activity, Neuromuscular re-education, Balance training, Gait training, Patient/Family education, Joint mobilization, Dry Needling, Biofeedback, and Manual therapy   PLAN FOR NEXT SESSION: Progress core/functional strengthening.    Aden Roberts, PT, DPT07/27/231:30 PM  PHYSICAL THERAPY DISCHARGE SUMMARY  Visits from Start of Care: 16  Current functional level related to goals / functional outcomes: Incomplete   Remaining  deficits: See above   Education / Equipment: HEP   Patient agrees to discharge. Patient goals were partially met. Patient is being discharged due to not returning since  the last visit.  Kemaria Roberts, PT, DPT12/26/2310:18 AM

## 2022-05-23 ENCOUNTER — Ambulatory Visit: Payer: 59

## 2022-06-04 ENCOUNTER — Other Ambulatory Visit: Payer: Self-pay | Admitting: Physician Assistant

## 2022-06-04 DIAGNOSIS — I82611 Acute embolism and thrombosis of superficial veins of right upper extremity: Secondary | ICD-10-CM

## 2022-10-31 ENCOUNTER — Ambulatory Visit (INDEPENDENT_AMBULATORY_CARE_PROVIDER_SITE_OTHER): Payer: 59

## 2022-10-31 ENCOUNTER — Ambulatory Visit (INDEPENDENT_AMBULATORY_CARE_PROVIDER_SITE_OTHER): Payer: 59 | Admitting: Sports Medicine

## 2022-10-31 VITALS — BP 117/80 | HR 88

## 2022-10-31 DIAGNOSIS — R0600 Dyspnea, unspecified: Secondary | ICD-10-CM

## 2022-10-31 DIAGNOSIS — R5383 Other fatigue: Secondary | ICD-10-CM | POA: Diagnosis not present

## 2022-10-31 NOTE — Progress Notes (Unsigned)
Complaints of shortness of breath

## 2022-10-31 NOTE — Progress Notes (Unsigned)
Charlotte Nelson - 42 y.o. female MRN [id number]  Date of birth: Jul 23, 1981  Office Visit Note: Visit Date: 10/31/2022 PCP: Silverio Decamp, MD Referred by: Silverio Decamp,*  Subjective: Chief Complaint  Patient presents with   Chest - Pain   HPI: Charlotte Nelson is a pleasant 42 y.o. female who presents today for evaluation of dyspnea.   Symptoms x 3 weeks Husband and kid had COVID-69 over this time, but she tested negative Conversational dyspnea sometimes, worse with activity - has had before Lots of stress recently with moving/new home, husband's work partner death Hx of GAD - maybe contributing  No personal history of DVT, but father had congenital LE (? May Thurner) - but was told this is not genetic NO LE symptoms  Hx of heart palpitations - had EKG and prior work-up (Holter monitor) that was normal Felt tachycardic in past - this improved with increase H20 intake  Pertinent ROS were reviewed with the patient and found to be negative unless otherwise specified above in HPI.   Assessment & Plan: Visit Diagnoses:  1. Dyspnea, unspecified type   2. Other fatigue     Plan: ***  Follow-up: No follow-ups on file.   Meds & Orders: No orders of the defined types were placed in this encounter.   Orders Placed This Encounter  Procedures   XR Chest 2 View   CBC with Differential   Ferritin   Comprehensive Metabolic Panel (CMET)   TSH   D-Dimer, Quantitative     Procedures: No procedures performed      Clinical History: No specialty comments available.  She reports that she has never smoked. She has never used smokeless tobacco.  Recent Labs    02/16/22 1356  HGBA1C 5.6    Objective:   Vital Signs: BP 117/80 (BP Location: Right Arm, Patient Position: Sitting)   Pulse 88   SpO2 97%   Physical Exam  Gen: Well-appearing, in no acute distress; non-toxic CV: Regular Rate. Well-perfused. Warm.  Resp: Breathing unlabored on room air; no  wheezing. Psych: Fluid speech in conversation; appropriate affect; normal thought process Neuro: Sensation intact throughout. No gross coordination deficits.   Ortho Exam - ***  Imaging: No results found.  Past Medical/Family/Surgical/Social History: Medications & Allergies reviewed per EMR, new medications updated. Patient Active Problem List   Diagnosis Date Noted   Thyroid enlargement 09/20/2021   Post-nasal drip 09/20/2021   Dysphagia 09/20/2021   Infected sebaceous cyst 06/13/2021   Superficial venous thrombosis of upper extremity, right 06/13/2021   Lactating mother 06/13/2021   Family history of DVT 06/13/2021   Family history of pulmonary embolism 06/13/2021   Hypertension in pregnancy, preeclampsia, delivered 11/17/2020   Postpartum care following vaginal delivery 11/17/2020   Encounter for elective induction of labor 11/16/2020   Dysuria 12/04/2019   Right wrist pain 11/06/2019   Perennial allergic rhinitis 11/06/2019   Palpitations 09/11/2019   Hair loss 05/22/2019   Screening for hyperlipidemia 04/27/2019   Cubital tunnel syndrome, left 02/24/2019   DDD (degenerative disc disease), lumbar 02/03/2019   Allergic conjunctivitis with xerophthalmia 01/22/2019   LPRD (laryngopharyngeal reflux disease) 11/25/2017   Not immune to hepatitis B virus 05/03/2016   Immunity status testing 05/01/2016   Migraine headache 07/07/2015   Dyspepsia 06/09/2015   Annual physical exam 05/12/2015   Left nephrolithiasis 05/12/2015   Essential tremor 05/12/2015   Mood disorder ([place]) 05/12/2015   Microcytosis 05/12/2015   Insulin controlled White classification A2 gestational diabetes mellitus (  GDM) 07/07/2011   Past Medical History:  Diagnosis Date   Alopecia    Anemia    not currently   Anxiety    Depression    Family history of adverse reaction to anesthesia    PATERNAL GRANDFATHER HAD UNKNOWN PROBLEM  MANY YRS AGO - "IT WASN'T A SERIOUS PROBLEM"   Gastritis    GERD  (gastroesophageal reflux disease)    Gestational diabetes    metformin and lantus insulin   Gestational diabetes mellitus, class A2 07/07/2011   does not normally have diabetes   Headache(784.0)    migraine   Hypertension in pregnancy, preeclampsia, delivered 11/17/2020   Kidney stones    OCD (obsessive compulsive disorder)    Pregnancy induced hypertension    nifedipine   Tremor    "familial tremor"   Family History  Problem Relation Age of Onset   Migraines Mother    Thyroid disease Mother    Hypertension Mother    Miscarriages / [place] Mother    Hyperlipidemia Mother    Urolithiasis Father    Hypertension Father    Diabetes Father    Nephrolithiasis Father    Thrombosis Father    Tremor Father    Deep vein thrombosis Father    Stroke Maternal Grandmother    Cancer Maternal Grandmother        lung ca   COPD Maternal Grandmother    Heart disease Maternal Grandmother    Diabetes Maternal Grandmother    Depression Maternal Grandmother    Alcohol abuse Maternal Grandfather    Heart disease Maternal Grandfather    Stroke Maternal Grandfather    Pulmonary embolism Maternal Grandfather    Stroke Paternal Grandmother    Diabetes Paternal Grandmother    Tremor Paternal Grandmother    Heart disease Paternal Grandfather    Diabetes Paternal Grandfather    Parkinson's disease Paternal Grandfather    Past Surgical History:  Procedure Laterality Date   CYSTOSCOPY W/ URETERAL STENT PLACEMENT Left 09/22/2015   Procedure: CYSTOSCOPY WITH LEFT  RETROGRADE PYELOGRAM/ STENT PLACEMENT;  Surgeon: Carolan Clines, MD;  [facility];  Service: Urology;  Laterality: Left;   CYSTOSCOPY WITH RETROGRADE PYELOGRAM, URETEROSCOPY AND STENT PLACEMENT Bilateral 02/27/2022   Procedure: CYSTOSCOPY WITH RETROGRADE PYELOGRAM, URETEROSCOPY AND STENT PLACEMENT;  Surgeon: Robley Fries, MD;  [facility];  Service: Urology;  Laterality: Bilateral;   HOLMIUM LASER APPLICATION Bilateral  11/30/9240   Procedure: HOLMIUM LASER APPLICATION;  Surgeon: Robley Fries, MD;  [facility];  Service: Urology;  Laterality: Bilateral;   LITHOTRIPSY     MOUTH SURGERY     wisdom teeth and gum x2   Social History   Occupational History   Not on file  Tobacco Use   Smoking status: Never   Smokeless tobacco: Never  Vaping Use   Vaping Use: Never used  Substance and Sexual Activity   Alcohol use: Not Currently    Comment: occasional   Drug use: No   Sexual activity: Yes    Birth control/protection: Pill

## 2022-11-01 ENCOUNTER — Encounter: Payer: Self-pay | Admitting: Sports Medicine

## 2022-11-01 LAB — CBC WITH DIFFERENTIAL/PLATELET
Absolute Monocytes: 502 cells/uL (ref 200–950)
Basophils Absolute: 30 cells/uL (ref 0–200)
Basophils Relative: 0.4 %
Eosinophils Absolute: 122 cells/uL (ref 15–500)
Eosinophils Relative: 1.6 %
HCT: 42.6 % (ref 35.0–45.0)
Hemoglobin: 14.3 g/dL (ref 11.7–15.5)
Lymphs Abs: 3367 cells/uL (ref 850–3900)
MCH: 26 pg — ABNORMAL LOW (ref 27.0–33.0)
MCHC: 33.6 g/dL (ref 32.0–36.0)
MCV: 77.3 fL — ABNORMAL LOW (ref 80.0–100.0)
MPV: 10.8 fL (ref 7.5–12.5)
Monocytes Relative: 6.6 %
Neutro Abs: 3580 cells/uL (ref 1500–7800)
Neutrophils Relative %: 47.1 %
Platelets: 285 10*3/uL (ref 140–400)
RBC: 5.51 10*6/uL — ABNORMAL HIGH (ref 3.80–5.10)
RDW: 13 % (ref 11.0–15.0)
Total Lymphocyte: 44.3 %
WBC: 7.6 10*3/uL (ref 3.8–10.8)

## 2022-11-01 LAB — COMPREHENSIVE METABOLIC PANEL
AG Ratio: 1.5 (calc) (ref 1.0–2.5)
ALT: 19 U/L (ref 6–29)
AST: 17 U/L (ref 10–30)
Albumin: 4.5 g/dL (ref 3.6–5.1)
Alkaline phosphatase (APISO): 66 U/L (ref 31–125)
BUN: 17 mg/dL (ref 7–25)
CO2: 23 mmol/L (ref 20–32)
Calcium: 9.5 mg/dL (ref 8.6–10.2)
Chloride: 105 mmol/L (ref 98–110)
Creat: 0.72 mg/dL (ref 0.50–0.99)
Globulin: 3 g/dL (calc) (ref 1.9–3.7)
Glucose, Bld: 88 mg/dL (ref 65–99)
Potassium: 4.3 mmol/L (ref 3.5–5.3)
Sodium: 139 mmol/L (ref 135–146)
Total Bilirubin: 0.5 mg/dL (ref 0.2–1.2)
Total Protein: 7.5 g/dL (ref 6.1–8.1)

## 2022-11-01 LAB — TSH: TSH: 1.67 mIU/L

## 2022-11-01 LAB — FERRITIN: Ferritin: 56 ng/mL (ref 16–154)

## 2022-11-13 ENCOUNTER — Other Ambulatory Visit: Payer: Self-pay | Admitting: Physical Medicine and Rehabilitation

## 2022-11-13 MED ORDER — PREDNISONE 20 MG PO TABS: 20.0000 mg | ORAL_TABLET | Freq: Every day | ORAL | 0 refills | Status: DC

## 2022-11-13 MED ORDER — AZITHROMYCIN 250 MG PO TABS: ORAL_TABLET | ORAL | 0 refills | Status: AC

## 2022-11-15 ENCOUNTER — Other Ambulatory Visit: Payer: Self-pay | Admitting: Physical Medicine and Rehabilitation

## 2022-11-15 MED ORDER — AMOXICILLIN-POT CLAVULANATE 875-125 MG PO TABS: 1.0000 | ORAL_TABLET | Freq: Two times a day (BID) | ORAL | 0 refills | Status: DC

## 2022-11-15 MED ORDER — FLUCONAZOLE 150 MG PO TABS: 150.0000 mg | ORAL_TABLET | Freq: Every day | ORAL | 0 refills | Status: AC

## 2023-01-05 LAB — HM PAP SMEAR

## 2023-01-05 LAB — RESULTS CONSOLE HPV: CHL HPV: NEGATIVE

## 2023-04-02 ENCOUNTER — Encounter: Payer: Self-pay | Admitting: Sports Medicine

## 2023-04-02 ENCOUNTER — Ambulatory Visit: Payer: 59 | Admitting: Sports Medicine

## 2023-04-02 VITALS — BP 154/99 | HR 98 | Ht 64.0 in | Wt 189.0 lb

## 2023-04-02 DIAGNOSIS — R7303 Prediabetes: Secondary | ICD-10-CM | POA: Diagnosis not present

## 2023-04-02 DIAGNOSIS — R002 Palpitations: Secondary | ICD-10-CM

## 2023-04-02 MED ORDER — METFORMIN HCL ER 500 MG PO TB24: 500.0000 mg | ORAL_TABLET | Freq: Every day | ORAL | 3 refills | Status: DC

## 2023-04-02 NOTE — Progress Notes (Signed)
    Procedures performed today:    Twelve-lead ECG: unchanged from prior and shows normal sinus rhythm with a rate of 84 bpm, normal axis, no ST changes, she does have some inverted T waves in V2 and V3 but similar to prior.   Independent interpretation of notes and tests performed by another provider:   None.  Brief History, Exam, Impression, and Recommendations:    Palpitations Charlotte Nelson returns, she is a very pleasant 42 year old female, she does have a history of palpitations, we worked this up back in 2021 with event monitoring, this was normal even during triggered episodes of palpitations she was normal sinus rhythm, we also did an echocardiogram that was normal. More recently she has had increasing stressors in life, she has been having increasing sensations of her heart beating, palpitations, she had 1 episode of presyncope. She has never had any episodes of chest pain on exertion. She was seen by one of our sports medicine colleagues, TSH, CBC, CMP, ferritin were checked which were normal.  Chest x-ray is also normal. She has great insight and does understand that her symptoms could entirely be due to anxiety. She has a normal exam today, we did a twelve-lead ECG, she did tells me she felt some palpitations towards the end of the ECG, the ECG however is unchanged from prior and shows normal sinus rhythm with a rate of 84 bpm, normal axis, no ST changes, she does have some inverted T waves in V2 and V3 but similar to prior. I reassured her today. We discussed behavioral therapy for her anxiety, she will continue this, we also discussed anxiolytic treatment with SSRIs, we will revisit this in about 6 to 12 weeks. She will also have a low threshold for calling me to repeat her Zio patch/event monitoring.  Prediabetes Charlotte Nelson did have just gestational diabetes, her A1c has run about 6 recently, she is interested in losing weight, as well as metformin, will start metformin 500 extended  release, I would also like her to do more of a Mediterranean diet, we can recheck her A1c as well as palpitations and anxiety in about 3 months.    ____________________________________________ [name]. Benjamin Stain, M.D., ABFM., CAQSM., AME. Primary Care and Sports Medicine [facility]  Adjunct Professor of Family Medicine  [place] [employer] [facility] [employer]  Restaurant manager, fast food

## 2023-04-02 NOTE — Assessment & Plan Note (Signed)
Charlotte Nelson returns, she is a very pleasant 42 year old female, she does have a history of palpitations, we worked this up back in 2021 with event monitoring, this was normal even during triggered episodes of palpitations she was normal sinus rhythm, we also did an echocardiogram that was normal. More recently she has had increasing stressors in life, she has been having increasing sensations of her heart beating, palpitations, she had 1 episode of presyncope. She has never had any episodes of chest pain on exertion. She was seen by one of our sports medicine colleagues, TSH, CBC, CMP, ferritin were checked which were normal.  Chest x-ray is also normal. She has great insight and does understand that her symptoms could entirely be due to anxiety. She has a normal exam today, we did a twelve-lead ECG, she did tells me she felt some palpitations towards the end of the ECG, the ECG however is unchanged from prior and shows normal sinus rhythm with a rate of 84 bpm, normal axis, no ST changes, she does have some inverted T waves in V2 and V3 but similar to prior. I reassured her today. We discussed behavioral therapy for her anxiety, she will continue this, we also discussed anxiolytic treatment with SSRIs, we will revisit this in about 6 to 12 weeks. She will also have a low threshold for calling me to repeat her Zio patch/event monitoring.

## 2023-04-02 NOTE — Patient Instructions (Signed)

## 2023-04-02 NOTE — Assessment & Plan Note (Addendum)
Daleysa did have just gestational diabetes, her A1c has run about 6 recently, she is interested in losing weight, as well as metformin, will start metformin 500 extended release, I would also like her to do more of a Mediterranean diet, we can recheck her A1c as well as palpitations and anxiety in about 3 months.

## 2023-04-04 ENCOUNTER — Ambulatory Visit: Payer: 59 | Admitting: Sports Medicine

## 2023-04-17 ENCOUNTER — Encounter (INDEPENDENT_AMBULATORY_CARE_PROVIDER_SITE_OTHER): Payer: 59 | Admitting: Sports Medicine

## 2023-04-17 DIAGNOSIS — F39 Unspecified mood [affective] disorder: Secondary | ICD-10-CM

## 2023-04-17 NOTE — Telephone Encounter (Signed)
I spent 5 total minutes of online digital evaluation and management services in this patient-initiated request for online care. 

## 2023-05-20 ENCOUNTER — Encounter (INDEPENDENT_AMBULATORY_CARE_PROVIDER_SITE_OTHER): Payer: 59 | Admitting: Sports Medicine

## 2023-05-20 DIAGNOSIS — Z6832 Body mass index (BMI) 32.0-32.9, adult: Secondary | ICD-10-CM

## 2023-05-20 DIAGNOSIS — Z7189 Other specified counseling: Secondary | ICD-10-CM | POA: Diagnosis not present

## 2023-05-20 DIAGNOSIS — E669 Obesity, unspecified: Secondary | ICD-10-CM | POA: Diagnosis not present

## 2023-05-21 DIAGNOSIS — E669 Obesity, unspecified: Secondary | ICD-10-CM | POA: Insufficient documentation

## 2023-05-21 MED ORDER — ZEPBOUND 2.5 MG/0.5ML ~~LOC~~ SOAJ: 2.5000 mg | SUBCUTANEOUS | 0 refills | Status: DC

## 2023-05-21 NOTE — Telephone Encounter (Signed)
I spent 5 total minutes of online digital evaluation and management services in this patient-initiated request for online care. 

## 2023-05-21 NOTE — Assessment & Plan Note (Signed)
Patient with obesity, BMI 32, she will be enrolled in a multidisciplinary weight loss program including calorie counting, an exercise prescription, adding Zepbound.

## 2023-07-03 ENCOUNTER — Encounter: Payer: Self-pay | Admitting: Sports Medicine

## 2023-07-03 ENCOUNTER — Ambulatory Visit: Payer: 59 | Admitting: Sports Medicine

## 2023-07-03 VITALS — BP 135/89 | HR 90 | Ht 64.0 in | Wt 187.0 lb

## 2023-07-03 DIAGNOSIS — E538 Deficiency of other specified B group vitamins: Secondary | ICD-10-CM | POA: Diagnosis not present

## 2023-07-03 DIAGNOSIS — R7303 Prediabetes: Secondary | ICD-10-CM | POA: Diagnosis not present

## 2023-07-03 DIAGNOSIS — E049 Nontoxic goiter, unspecified: Secondary | ICD-10-CM

## 2023-07-03 DIAGNOSIS — E669 Obesity, unspecified: Secondary | ICD-10-CM

## 2023-07-03 DIAGNOSIS — F39 Unspecified mood [affective] disorder: Secondary | ICD-10-CM | POA: Diagnosis not present

## 2023-07-03 DIAGNOSIS — R7989 Other specified abnormal findings of blood chemistry: Secondary | ICD-10-CM

## 2023-07-03 LAB — POCT GLYCOSYLATED HEMOGLOBIN (HGB A1C): Hemoglobin A1C: 5.7 % — AB (ref 4.0–5.6)

## 2023-07-03 MED ORDER — TIRZEPATIDE 10 MG/0.5ML ~~LOC~~ SOAJ
SUBCUTANEOUS | 11 refills | Status: DC
Start: 2023-07-03 — End: 2023-07-04

## 2023-07-03 NOTE — Assessment & Plan Note (Addendum)
A1c improved really with metformin. She does have obesity and is considering compounded GLP-1's, she will let me know.  Update: Patient would like to try tirzepatide compounded.

## 2023-07-03 NOTE — Assessment & Plan Note (Signed)
Rechecking TFTs per patient request

## 2023-07-03 NOTE — Assessment & Plan Note (Addendum)
Please see prior note, she is doing well off of medication.  A lot of this is improved due to switching jobs.

## 2023-07-03 NOTE — Progress Notes (Addendum)
    Procedures performed today:    None.  Independent interpretation of notes and tests performed by another provider:   None.  Brief History, Exam, Impression, and Recommendations:    Mood disorder (HCC) Please see prior note, she is doing well off of medication.  A lot of this is improved due to switching jobs.  Thyroid enlargement Rechecking TFTs per patient request  Prediabetes A1c improved really with metformin. She does have obesity and is considering compounded GLP-1's, she will let me know.  Update: Patient would like to try tirzepatide compounded.  Obesity (BMI 30-39.9) Patient would like to try tirzepatide compounded. Zepbound not covered.  I spent 30 minutes of total time managing this patient today, this includes chart review, face to face, and non-face to face time.  ____________________________________________ [name]. Benjamin Stain, M.D., ABFM., CAQSM., AME. Primary Care and Sports Medicine [facility]  Adjunct Professor of Family Medicine  [place] [employer] [facility] [employer]  Restaurant manager, fast food

## 2023-07-03 NOTE — Assessment & Plan Note (Signed)
Patient would like to try tirzepatide compounded. Zepbound not covered.

## 2023-07-04 MED ORDER — ONDANSETRON 8 MG PO TBDP
8.0000 mg | ORAL_TABLET | Freq: Three times a day (TID) | ORAL | 3 refills | Status: DC | PRN
Start: 2023-07-04 — End: 2024-07-30

## 2023-07-04 MED ORDER — SEMAGLUTIDE (2 MG/DOSE) 8 MG/3ML ~~LOC~~ SOPN
PEN_INJECTOR | SUBCUTANEOUS | 3 refills | Status: DC
Start: 2023-07-04 — End: 2023-07-07

## 2023-07-04 NOTE — Addendum Note (Signed)
Addended by: Monica Becton on: 07/04/2023 12:26 PM   Modules accepted: Orders

## 2023-07-05 LAB — HOMOCYSTEINE: Homocysteine: 8.2 umol/L (ref 0.0–14.5)

## 2023-07-05 LAB — LIPID PANEL
Chol/HDL Ratio: 2.6 ratio (ref 0.0–4.4)
Cholesterol, Total: 147 mg/dL (ref 100–199)
HDL: 56 mg/dL (ref >39)
LDL Chol Calc (NIH): 74 mg/dL (ref 0–99)
Triglycerides: 94 mg/dL (ref 0–149)
VLDL Cholesterol Cal: 17 mg/dL (ref 5–40)

## 2023-07-05 LAB — T3, FREE: T3, Free: 3.4 pg/mL (ref 2.0–4.4)

## 2023-07-05 LAB — T4, FREE: Free T4: 1.11 ng/dL (ref 0.82–1.77)

## 2023-07-05 LAB — TSH: TSH: 1.79 u[IU]/mL (ref 0.450–4.500)

## 2023-07-05 LAB — VITAMIN B12: Vitamin B-12: 526 pg/mL (ref 232–1245)

## 2023-07-05 NOTE — Telephone Encounter (Signed)
PAs to [facility] [place].

## 2023-07-07 MED ORDER — ZEPBOUND 2.5 MG/0.5ML ~~LOC~~ SOAJ
2.5000 mg | SUBCUTANEOUS | 0 refills | Status: DC
Start: 2023-07-07 — End: 2023-07-09

## 2023-07-07 NOTE — Telephone Encounter (Signed)
Back and forth and back and forth with these indecisive patients.  No wonder they fail at everything they try.  Sending in [place], again, and maximally billing for my time.

## 2023-07-07 NOTE — Addendum Note (Signed)
Addended by: Monica Becton on: 07/07/2023 10:05 PM   Modules accepted: Orders

## 2023-07-08 ENCOUNTER — Telehealth: Payer: Self-pay | Admitting: Sports Medicine

## 2023-07-08 DIAGNOSIS — E669 Obesity, unspecified: Secondary | ICD-10-CM

## 2023-07-08 NOTE — Telephone Encounter (Signed)
[facility] called in stating that the following needed to be added to the tirzepatide [facility]):  Pyridoxine 10 mg/ml injection  Will fax over actual request

## 2023-07-08 NOTE — Telephone Encounter (Signed)
Please check with Malaisha, it sounded like she wanted [place] to try to get branded Zepbound approved through her insurance.  If she would like to go back to the compounded route then I would be happy to send the tir[facility]epatide to [facility].

## 2023-07-08 NOTE — Telephone Encounter (Addendum)
Okay but what about Saylah?  I was under the impression she wanted [place] to try to get her brand-name Zepbound as opposed to compounded tirzepatide.

## 2023-07-09 MED ORDER — ZEPBOUND 2.5 MG/0.5ML ~~LOC~~ SOAJ: 2.5000 mg | SUBCUTANEOUS | 0 refills | Status: DC

## 2023-07-09 NOTE — Telephone Encounter (Signed)
Sent.  Its in Keyairea's hands now.  For insurance coverage purposes the patient is obese, she has tried dieting and exercise, she will be enrolled in a multidisciplinary weight loss program.

## 2023-07-12 NOTE — Telephone Encounter (Signed)
Pt called.  She is following up on PA for Zepbound.  Contact for [phone].

## 2023-07-12 NOTE — Telephone Encounter (Signed)
PA submitted  awaiting results =kph

## 2023-07-19 ENCOUNTER — Telehealth: Payer: Self-pay

## 2023-07-19 NOTE — Telephone Encounter (Signed)
Initiated Prior authorization [id number]:AOZHYQMV 2.5MG /0.5ML pen-injectors Via: Covermymeds Case/Key:[id number] Status: approved  as of 07/19/23 Reason:Authorization Expiration Date: 07/18/2024 Notified Pt via: Mychart

## 2023-07-26 ENCOUNTER — Encounter: Payer: Self-pay | Admitting: Sports Medicine

## 2023-07-26 ENCOUNTER — Ambulatory Visit (INDEPENDENT_AMBULATORY_CARE_PROVIDER_SITE_OTHER): Payer: 59 | Admitting: Sports Medicine

## 2023-07-26 DIAGNOSIS — Q846 Other congenital malformations of nails: Secondary | ICD-10-CM | POA: Insufficient documentation

## 2023-07-26 DIAGNOSIS — E669 Obesity, unspecified: Secondary | ICD-10-CM

## 2023-07-26 MED ORDER — ZEPBOUND 2.5 MG/0.5ML ~~LOC~~ SOAJ
2.5000 mg | SUBCUTANEOUS | 3 refills | Status: DC
Start: 2023-07-26 — End: 2023-09-06

## 2023-07-26 MED ORDER — IBUPROFEN 800 MG PO TABS
800.0000 mg | ORAL_TABLET | Freq: Three times a day (TID) | ORAL | 2 refills | Status: DC | PRN
Start: 2023-07-26 — End: 2023-10-29

## 2023-07-26 MED ORDER — DOXYCYCLINE HYCLATE 100 MG PO TABS
100.0000 mg | ORAL_TABLET | Freq: Two times a day (BID) | ORAL | 0 refills | Status: AC
Start: 2023-07-26 — End: 2023-08-02

## 2023-07-26 NOTE — Progress Notes (Signed)
    Procedures performed today:    None.  Independent interpretation of notes and tests performed by another provider:   None.  Brief History, Exam, Impression, and Recommendations:    Hyponychia left ring finger Pleasant 42 year old female, 6 weeks of pain left ring finger, had an infection just past the tip of the nail, a little bit going into the lateral nail fold. She has been cutting it but I think she has been cutting it too close and she is starting to get an ingrown fingernail. She will cut it off approximately even with the tip of the finger, we will add prescription strength ibuprofen and doxycycline, return to see me as needed in 4 to 6 weeks for this. She does have dermatology weighing in as well.  Obesity (BMI 30-39.9) Got Zepbound covered, currently on 2.5, she would like to do the 2.5 for at least another couple of months, she will let me know when she is ready to go up to 5 mg.    ____________________________________________ [name]. Benjamin Stain, M.D., ABFM., CAQSM., AME. Primary Care and Sports Medicine [facility]  Adjunct Professor of Family Medicine  [place] [employer] [facility] [employer]  Restaurant manager, fast food

## 2023-07-26 NOTE — Assessment & Plan Note (Signed)
Got Zepbound covered, currently on 2.5, she would like to do the 2.5 for at least another couple of months, she will let me know when she is ready to go up to 5 mg.

## 2023-07-26 NOTE — Assessment & Plan Note (Signed)
Pleasant 42 year old female, 6 weeks of pain left ring finger, had an infection just past the tip of the nail, a little bit going into the lateral nail fold. She has been cutting it but I think she has been cutting it too close and she is starting to get an ingrown fingernail. She will cut it off approximately even with the tip of the finger, we will add prescription strength ibuprofen and doxycycline, return to see me as needed in 4 to 6 weeks for this. She does have dermatology weighing in as well.

## 2023-08-23 LAB — HM MAMMOGRAPHY: HM Mammogram: NORMAL (ref 0–4)

## 2023-08-28 ENCOUNTER — Encounter: Payer: Self-pay | Admitting: Sports Medicine

## 2023-08-30 ENCOUNTER — Telehealth: Payer: Self-pay | Admitting: Sports Medicine

## 2023-08-30 NOTE — Telephone Encounter (Signed)
Patient requests update/needs prior authorization for ;  tirzepatide (ZEPBOUND) 2.5 MG/0.5ML Pen [[id number]]

## 2023-09-02 ENCOUNTER — Telehealth: Payer: Self-pay | Admitting: Sports Medicine

## 2023-09-02 NOTE — Telephone Encounter (Signed)
Patient says she needs a new PA for ZEPBOUND 2.5mg  per her pharmacy please advise [facility]  [place]  [phone]

## 2023-09-06 ENCOUNTER — Encounter (INDEPENDENT_AMBULATORY_CARE_PROVIDER_SITE_OTHER): Payer: 59 | Admitting: Sports Medicine

## 2023-09-06 DIAGNOSIS — E669 Obesity, unspecified: Secondary | ICD-10-CM | POA: Diagnosis not present

## 2023-09-06 MED ORDER — MOUNJARO 5 MG/0.5ML ~~LOC~~ SOAJ
5.0000 mg | SUBCUTANEOUS | 11 refills | Status: DC
Start: 2023-09-06 — End: 2024-07-20

## 2023-09-06 NOTE — Telephone Encounter (Signed)

## 2023-09-12 ENCOUNTER — Telehealth: Payer: Self-pay

## 2023-09-12 NOTE — Telephone Encounter (Signed)
Initiated Prior authorization ZSW:FUXNATFT 2.5MG /0.5ML pen-injectors Via: Covermymeds Case/Key:[id number] Status: denied as of 09/12/23 Reason:Why was my request denied? This request was denied because you did not meet the following requirements: Based on the information provided, you do not meet the established medication-specific criteria or guidelines for Zepbound at this time. The request for 2mL ZEPBOUND INJ 2.5MG  for one month is denied. This decision is based on health plan criteria for ZEPBOUND INJ 2.5MG . More than 4ml per 365 days is covered only if: You require a lower dose due to intolerance or you need to restart if therapy was discontinued. The information provided does not show that you meet the criteria listed above. Reviewed by: Ardell IsaacsPh. The reason(s) [facility] did not approve this medication can be found above. This denial is based on our Zepbound drug coverage policy, in addition to any supplementary information you or your prescriber may have submitted Notified Pt via: Mychart

## 2023-10-28 ENCOUNTER — Ambulatory Visit: Payer: 59 | Admitting: Sports Medicine

## 2023-10-29 ENCOUNTER — Ambulatory Visit: Payer: 59 | Admitting: Sports Medicine

## 2023-10-29 VITALS — BP 123/89 | HR 83 | Ht 64.0 in | Wt 164.0 lb

## 2023-10-29 DIAGNOSIS — L659 Nonscarring hair loss, unspecified: Secondary | ICD-10-CM

## 2023-10-29 DIAGNOSIS — E349 Endocrine disorder, unspecified: Secondary | ICD-10-CM | POA: Diagnosis not present

## 2023-10-29 DIAGNOSIS — E049 Nontoxic goiter, unspecified: Secondary | ICD-10-CM

## 2023-10-29 DIAGNOSIS — E669 Obesity, unspecified: Secondary | ICD-10-CM | POA: Diagnosis not present

## 2023-10-29 DIAGNOSIS — R41840 Attention and concentration deficit: Secondary | ICD-10-CM

## 2023-10-29 DIAGNOSIS — Z0000 Encounter for general adult medical examination without abnormal findings: Secondary | ICD-10-CM

## 2023-10-29 NOTE — Assessment & Plan Note (Signed)
 Over 20 pound weight loss currently on Mounjaro, follow this up in 6 months.

## 2023-10-29 NOTE — Assessment & Plan Note (Signed)
 Ilana reports significant difficulty with attentiveness and following through with tasks. She is interested in ADHD testing, will order this.

## 2023-10-29 NOTE — Patient Instructions (Signed)
 Healthy BMI Range: 107.8 - 145.6 lbs

## 2023-10-29 NOTE — Assessment & Plan Note (Signed)
 Currently up-to-date on screening measures, flu shot done early October, Pap January 05, 2023, mammogram November 2024, Tdap was 2017.

## 2023-10-29 NOTE — Assessment & Plan Note (Signed)
 Recent thyroid ultrasound was normal, no further evaluation needed. I have advised if she is using salt to make sure she is using iodized salt.

## 2023-10-29 NOTE — Addendum Note (Signed)
 Addended by: Monica Becton on: 10/29/2023 05:27 PM   Modules accepted: Orders

## 2023-10-29 NOTE — Assessment & Plan Note (Signed)
 Discussed options including minoxidil and finasteride, she will let me know if she would like to try 1.

## 2023-10-29 NOTE — Progress Notes (Signed)
    Procedures performed today:    None.  Independent interpretation of notes and tests performed by another provider:   None.  Brief History, Exam, Impression, and Recommendations:    Annual physical exam Currently up-to-date on screening measures, flu shot done early October, Pap January 05, 2023, mammogram November 2024, Tdap was 2017.  Obesity (BMI 30-39.9) Over 20 pound weight loss currently on Mounjaro , follow this up in 6 months.  Thyroid  enlargement Recent thyroid  ultrasound was normal, no further evaluation needed. I have advised if she is using salt to make sure she is using iodized salt.  Inattention Carlia reports significant difficulty with attentiveness and following through with tasks. She is interested in ADHD testing, will order this.  Hair thinning Discussed options including minoxidil and finasteride, she will let me know if she would like to try 1.  I spent 30 minutes of total time managing this patient today, this includes chart review, face to face, and non-face to face time.  ____________________________________________ Debby PARAS. Curtis, M.D., ABFM., CAQSM., AME. Primary Care and Sports Medicine [facility]  Adjunct Professor of [facility] Medicine  [employer]  Restaurant Manager, [employer]

## 2023-10-30 LAB — PROGESTERONE: Progesterone: 13.2 ng/mL

## 2023-10-30 LAB — PROLACTIN: Prolactin: 7.7 ng/mL (ref 4.8–33.4)

## 2023-10-30 LAB — FOLLICLE STIMULATING HORMONE: FSH: 3.1 m[IU]/mL

## 2023-10-30 LAB — LUTEINIZING HORMONE: LH: 8.6 m[IU]/mL

## 2023-10-30 LAB — TESTOSTERONE, FREE, TOTAL, SHBG
Sex Hormone Binding: 40 nmol/L (ref 24.6–122.0)
Testosterone, Free: 1.2 pg/mL (ref 0.0–4.2)
Testosterone: 30 ng/dL (ref 4–50)

## 2023-10-30 LAB — ESTROGENS, TOTAL: Estrogen: 474 pg/mL

## 2024-04-27 ENCOUNTER — Ambulatory Visit: Payer: 59 | Admitting: Sports Medicine

## 2024-05-08 ENCOUNTER — Ambulatory Visit: Admitting: Sports Medicine

## 2024-05-08 VITALS — BP 104/67 | HR 98 | Resp 20 | Ht 64.0 in | Wt 146.0 lb

## 2024-05-08 DIAGNOSIS — R6882 Decreased libido: Secondary | ICD-10-CM | POA: Insufficient documentation

## 2024-05-08 DIAGNOSIS — E669 Obesity, unspecified: Secondary | ICD-10-CM

## 2024-05-08 NOTE — Assessment & Plan Note (Signed)
 No new medications, however Elner has noted decreased libido as she has lost weight. She did have all of her hormones checked with gynecology, they were normal. I am not aware of GLP-1s creating low libido, and low libido is extremely multifactorial. She plans to try to come off of it over the next several months which I think is fine, and if the libido does not improve we will consider Addyi.

## 2024-05-08 NOTE — Progress Notes (Signed)
    Procedures performed today:    None.  Independent interpretation of notes and tests performed by another provider:   None.  Brief History, Exam, Impression, and Recommendations:    Obesity (BMI 30-39.9) Fantastic weight loss. She is planning to stay on Mounjaro  5 mg for maybe another few months getting to a target weight of 135 pounds. She would like come off of it at that point, she is concerned that the Mounjaro  has decreased libido.   Libido, decreased No new medications, however Snow has noted decreased libido as she has lost weight. She did have all of her hormones checked with gynecology, they were normal. I am not aware of GLP-1s creating low libido, and low libido is extremely multifactorial. She plans to try to come off of it over the next several months which I think is fine, and if the libido does not improve we will consider Addyi.    ____________________________________________ Debby PARAS. Curtis, M.D., ABFM., CAQSM., AME. Primary Care and Sports Medicine [facility]  Adjunct Professor of [facility] Medicine  [employer]  Restaurant manager, fast food

## 2024-05-08 NOTE — Assessment & Plan Note (Signed)
 Fantastic weight loss. She is planning to stay on Mounjaro  5 mg for maybe another few months getting to a target weight of 135 pounds. She would like come off of it at that point, she is concerned that the Mounjaro  has decreased libido.

## 2024-06-10 ENCOUNTER — Other Ambulatory Visit: Payer: Self-pay

## 2024-06-10 ENCOUNTER — Ambulatory Visit: Attending: Sports Medicine

## 2024-06-10 DIAGNOSIS — R279 Unspecified lack of coordination: Secondary | ICD-10-CM | POA: Diagnosis present

## 2024-06-10 DIAGNOSIS — M6281 Muscle weakness (generalized): Secondary | ICD-10-CM | POA: Insufficient documentation

## 2024-06-10 DIAGNOSIS — R6882 Decreased libido: Secondary | ICD-10-CM | POA: Diagnosis present

## 2024-06-10 NOTE — Therapy (Signed)
 OUTPATIENT PHYSICAL THERAPY FEMALE PELVIC EVALUATION   Patient Name: Charlotte Nelson MRN: [id number] DOB:May 20, 1981, 43 y.o., female Today's Date: 06/10/2024  END OF SESSION:  PT End of Session - 06/10/24 1016     Visit Number 1    Date for PT Re-Evaluation 11/25/24    Authorization Type UHC    PT Start Time 1015    PT Stop Time 1055    PT Time Calculation (min) 40 min    Activity Tolerance Patient tolerated treatment well    Behavior During Therapy WFL for tasks assessed/performed          Past Medical History:  Diagnosis Date   Alopecia    Anemia    not currently   Anxiety    Depression    Family history of adverse reaction to anesthesia    PATERNAL GRANDFATHER HAD UNKNOWN PROBLEM  MANY YRS AGO - IT WASN'T A SERIOUS PROBLEM   Gastritis    GERD (gastroesophageal reflux disease)    Gestational diabetes    metformin  and lantus insulin   Gestational diabetes mellitus, class A2 07/07/2011   does not normally have diabetes   Headache(784.0)    migraine   Hypertension in pregnancy, preeclampsia, delivered 11/17/2020   Kidney stones    OCD (obsessive compulsive disorder)    Pregnancy induced hypertension    nifedipine    Tremor    familial tremor   Past Surgical History:  Procedure Laterality Date   CYSTOSCOPY W/ URETERAL STENT PLACEMENT Left 09/22/2015   Procedure: CYSTOSCOPY WITH LEFT  RETROGRADE PYELOGRAM/ STENT PLACEMENT;  Surgeon: Arlena Gal, MD;  Location: [facility];  Service: Urology;  Laterality: Left;   CYSTOSCOPY WITH RETROGRADE PYELOGRAM, URETEROSCOPY AND STENT PLACEMENT Bilateral 02/27/2022   Procedure: CYSTOSCOPY WITH RETROGRADE PYELOGRAM, URETEROSCOPY AND STENT PLACEMENT;  Surgeon: Elisabeth Valli BIRCH, MD;  Location: [facility];  Service: Urology;  Laterality: Bilateral;   HOLMIUM LASER APPLICATION Bilateral 02/27/2022   Procedure: HOLMIUM LASER APPLICATION;  Surgeon: Elisabeth Valli BIRCH, MD;  Location: [facility];  Service: Urology;  Laterality: Bilateral;    LITHOTRIPSY     MOUTH SURGERY     wisdom teeth and gum x2   Patient Active Problem List   Diagnosis Date Noted   Libido, decreased 05/08/2024   Inattention 10/29/2023   Hyponychia left ring finger 07/26/2023   Obesity (BMI 30-39.9) 05/21/2023   Prediabetes 04/02/2023   Thyroid  enlargement 09/20/2021   Superficial venous thrombosis of upper extremity, right 06/13/2021   Family history of DVT 06/13/2021   Family history of pulmonary embolism 06/13/2021   Right wrist pain 11/06/2019   Perennial allergic rhinitis 11/06/2019   Palpitations 09/11/2019   Hair thinning 05/22/2019   Screening for hyperlipidemia 04/27/2019   Cubital tunnel syndrome, left 02/24/2019   DDD (degenerative disc disease), lumbar 02/03/2019   Allergic conjunctivitis with xerophthalmia 01/22/2019   LPRD (laryngopharyngeal reflux disease) 11/25/2017   Migraine headache 07/07/2015   Annual physical exam 05/12/2015   Left nephrolithiasis 05/12/2015   Essential tremor 05/12/2015   Mood disorder (HCC) 05/12/2015   Microcytosis 05/12/2015    PCP: Curtis Debby PARAS, MD  REFERRING PROVIDER: Curtis Debby PARAS, MD  REFERRING DIAG: R68.82 (ICD-10-CM) - Libido, decreased  THERAPY DIAG:  Muscle weakness (generalized)  Unspecified lack of coordination  Rationale for Evaluation and Treatment: Rehabilitation  ONSET DATE: 10/2019  SUBJECTIVE:  SUBJECTIVE STATEMENT: Pt states that she is feeling very weak. She would like to work on diastasis recti and core strength. She is having very mild stress urinary incontinence. She feels like she may have some uterine prolapse due to pain with full penetration. She got confirmation from OBGYN yesterday that she has uterine prolapse. She is bothered by her low libido, but feels like this  is due to Monjaro. She is using squatty potty regularly.    PAIN:  Are you having pain? Yes NPRS scale: 7/10 Pain location: pain during intercourse  Pain type: tender Pain description: intermittent   Aggravating factors: certain positions during intercourse  Relieving factors: no vaginal penetration  PRECAUTIONS: None  RED FLAGS: None   WEIGHT BEARING RESTRICTIONS: No  FALLS:  Has patient fallen in last 6 months? No  OCCUPATION: utlrasound technician   ACTIVITY LEVEL : none  PLOF: Independent  PATIENT GOALS: improve intercourse discomfort, decrease urinary incontinence, improve core strength, get active overall   PERTINENT HISTORY:  G4P4, GERD, lumbar DDD Sexual abuse: No  BOWEL MOVEMENT: Pain with bowel movement: No Type of bowel movement:Type (Bristol Stool Scale) 1-2, Frequency 1x/day, and Strain yes Fully empty rectum: No Leakage: No Pads: No Fiber supplement/laxative Yes - Miralax  URINATION: Pain with urination: No Fully empty bladder: Yes:   Stream: Strong Urgency: No Frequency: every 2 hours during the day Fluid Intake: not enough Leakage: Coughing, Sneezing, and Laughing Pads: No  INTERCOURSE:  Ability to have vaginal penetration Yes  Pain with intercourse: Initial Penetration, During Penetration, and Deep Penetration DrynessYes  Climax: less intense and harder to achieve Marinoff Scale: 1/3 Lubricant: yes  PREGNANCY: Vaginal deliveries 4 Tearing Yes:   Episiotomy No C-section deliveries 0 Currently pregnant No  PROLAPSE: Pressure and Bulge   OBJECTIVE:  Note: Objective measures were completed at Evaluation unless otherwise noted.   PATIENT SURVEYS:   PFIQ-7: 72, 33, 38 (combined, bladder, bowel)  COGNITION: Overall cognitive status: Within functional limits for tasks assessed     SENSATION: Light touch: Appears intact   FUNCTIONAL TESTS:  Squat:Rt weight shift  Single leg stance:  Rt: pelvic drop  Lt: stable   Curl-up test: abdominal distortion Sit-up test: 1/3 ACTIVE STRAIGHT LEG RAISE: (+) bil   GAIT: Assistive device utilized: None Comments: WNL  POSTURE: increased lumbar lordosis, anterior pelvic tilt, and Rt posterior rotation   LUMBARAROM/PROM: WNL   PALPATION:   General: WNL  Pelvic Alignment: Rt posterior rotation  Abdominal: apical breathing pattern                External Perineal Exam: WNL                             Internal Pelvic Floor: WNL - palpation of cervix  Patient confirms identification and approves PT to assess internal pelvic floor and treatment Yes  PELVIC MMT:   MMT eval  Vaginal 2/5, 5 second hold, 3 repeat contractions  Internal Anal Sphincter   External Anal Sphincter   Puborectalis   Diastasis Recti 2-3 finger width separation  (Blank rows = not tested)        TONE: WNL  PROLAPSE: Grade 1 anterior vaginal wall laxity and uterine ligament laxity   TODAY'S TREATMENT:  DATE:  06/10/24 EVAL  Neuromuscular re-education: Pt provides verbal consent for internal vaginal/rectal pelvic floor exam. Internal vaginal pelvic floor muscle contraction training Quick flicks Long holds Bridge with hip adduction, transversus abdominus, and pelvic floor muscle 2 x 10 Therapeutic activities: Inverted lying positions     PATIENT EDUCATION:  Education details: See above Person educated: Patient Education method: Programmer, multimedia, Demonstration, Tactile cues, Verbal cues, and Handouts Education comprehension: verbalized understanding  HOME EXERCISE PROGRAM: [id number]  ASSESSMENT:  CLINICAL IMPRESSION: Patient is a 43 y.o. female who was seen today for physical therapy evaluation and treatment for core weakness, constipation, and stress urinary incontinence. Exam findings notable for abnormal posture, pelvic drop in single leg  stance, (+) active straight leg raise bil, inability to perform sit-up test, core weakness with abdominal distortion and diastasis recti abdominus, pelvic floor muscle weakness and decreased endurance, and mild uterine and anterior vaginal wall laxity. Signs and symptoms are most consistent with pelvic floor muscle weakness, core weakness, and poor pressure management. Weakness and symptoms are impacting care of family and work activities. Initial treatment consisted of pelvic floor muscle contraction training, inverted lying position, and bridges; these activities performed to begin strengthening deep core and use gravity to help reduce symptoms. She will continue to benefit from skilled PT intervention in order improve constipation, decrease urinary incontinence, decrease dyspareunia, address all impairments, and improve quality of life.   OBJECTIVE IMPAIRMENTS: decreased activity tolerance, decreased coordination, decreased endurance, decreased mobility, decreased ROM, decreased strength, increased fascial restrictions, increased muscle spasms, impaired flexibility, impaired tone, improper body mechanics, postural dysfunction, and pain.   ACTIVITY LIMITATIONS: lifting and continence  PARTICIPATION LIMITATIONS: interpersonal relationship, community activity, occupation, and exercise  PERSONAL FACTORS: 1 comorbidity: medical history are also affecting patient's functional outcome.   REHAB POTENTIAL: Good  CLINICAL DECISION MAKING: Stable/uncomplicated  EVALUATION COMPLEXITY: Low   GOALS: Goals reviewed with patient? Yes  SHORT TERM GOALS: Target date: 07/08/2024    Pt will be independent with HEP in order to improve activity tolerance.   Baseline: Goal status: INITIAL  2.  Pt will be able to correctly perform diaphragmatic breathing and appropriate pressure management in order to prevent worsening vaginal wall/uterine ligament laxity and improve pelvic floor A/ROM.    Baseline: grade  1 anterior vaginal wall and uterine ligament laxity Goal status: INITIAL  3.  Pt will have daily bowel movement with no straining and complete evacuation in order to put less pressure on uterine prolapse and improve pressure management.   Baseline: straining and incomplete Goal status: INITIAL  4.  Patient will be able to perform single leg stance with good pelvic stability in order to demonstrate appropriate pelvic floor muscle and transversus abdominus strength and coordination in order to decrease urinary incontinence.   Baseline:  Goal status: INITIAL   LONG TERM GOALS: Target date: 11/25/2024  Pt will be independent with advanced HEP in order to improve activity tolerance.   Baseline:  Goal status: INITIAL  2.  Pt will report no leaks with laughing, coughing, sneezing in order to improve comfort with interpersonal relationships and community activities.   Baseline: leaking Goal status: INITIAL  3.  Pt will report 0/10 pain with vaginal penetration in order to improve intimate relationship with partner.    Baseline: pain with certain positions  Goal status: INITIAL  4.  Pt will demonstrate 3/3 on sit-up test in order to have improved core strength that will allow her to avoid injury in the future.  Baseline: 1/3  Goal status: INITIAL  5.  Pt will demonstrate no abdominal distortion with curl-up test in order to prevent abnormal forces on pelvis/pelvic floor muscles to decrease risk of worsening prolapse and bowel/bladder dysfunction. Baseline: distortion Goal status: INITIAL  6.  Pt will demonstrate normal pelvic floor muscle tone and A/ROM, able to achieve 3/5 strength with contractions and 10 sec endurance, in order to reduce urinary leaking and number of pads patient wears.   Baseline: 2/5, 5 second endurance  Goal status: INITIAL  PLAN:  PT FREQUENCY: 1-2x/week  PT DURATION: 16 visits    PLANNED INTERVENTIONS: 97164- PT Re-evaluation, 97110-Therapeutic  exercises, 97530- Therapeutic activity, 97112- Neuromuscular re-education, 97535- Self Care, 02859- Manual therapy, [phone]- Gait training, [phone]- Aquatic Therapy, [phone]- Electrical stimulation (unattended), [phone]- Traction (mechanical), [id number]- Ionotophoresis 4mg /ml Dexamethasone , 79439 (1-2 muscles), 20561 (3+ muscles)- Dry Needling, Patient/Family education, Balance training, Taping, Joint mobilization, Joint manipulation, Spinal manipulation, Spinal mobilization, Scar mobilization, Vestibular training, Cryotherapy, Moist heat, and Biofeedback  PLAN FOR NEXT SESSION: core strengthening; pressure management   Josette Mares, PT, DPT08/20/2511:38 AM

## 2024-06-10 NOTE — Patient Instructions (Signed)
   The first picture shows that there is no effect on the pelvic floor with gravity eliminated. The next three show that with a wedge pillow or a few pillows from home under your pelvis the pelvic floor is inverted and may relax and allows gravity to help return prolapsed areas more inward to help relieve symptoms. Do this 15-20 mins every evening when symptoms tend to be worse. Stop if you have pain or negative symptoms.   [facility] [address] [place], [place] [address] Phone # [phone] Fax [phone]

## 2024-06-12 ENCOUNTER — Encounter: Payer: Self-pay | Admitting: Sports Medicine

## 2024-06-12 DIAGNOSIS — H04129 Dry eye syndrome of unspecified lacrimal gland: Secondary | ICD-10-CM

## 2024-06-15 ENCOUNTER — Ambulatory Visit

## 2024-06-15 DIAGNOSIS — M6281 Muscle weakness (generalized): Secondary | ICD-10-CM | POA: Diagnosis not present

## 2024-06-15 DIAGNOSIS — R279 Unspecified lack of coordination: Secondary | ICD-10-CM

## 2024-06-15 NOTE — Therapy (Signed)
 OUTPATIENT PHYSICAL THERAPY FEMALE PELVIC TREATMENT   Patient Name: Charlotte Nelson MRN: [id number] DOB:[birth year], 43 y.o., female Today's Date: 06/15/2024  END OF SESSION:  PT End of Session - 06/15/24 1104     Visit Number 2    Date for PT Re-Evaluation 11/25/24    Authorization Type [employer]    PT Start Time 1100    PT Stop Time 1140    PT Time Calculation (min) 40 min    Activity Tolerance Patient tolerated treatment well    Behavior During Therapy WFL for tasks assessed/performed          Past Medical History:  Diagnosis Date   Alopecia    Anemia    not currently   Anxiety    Depression    Family history of adverse reaction to anesthesia    PATERNAL GRANDFATHER HAD UNKNOWN PROBLEM  MANY YRS AGO - IT WASN'T A SERIOUS PROBLEM   Gastritis    GERD (gastroesophageal reflux disease)    Gestational diabetes    metformin  and lantus insulin   Gestational diabetes mellitus, class A2 07/07/2011   does not normally have diabetes   Headache(784.0)    migraine   Hypertension in pregnancy, preeclampsia, delivered 11/17/2020   Kidney stones    OCD (obsessive compulsive disorder)    Pregnancy induced hypertension    nifedipine    Tremor    familial tremor   Past Surgical History:  Procedure Laterality Date   CYSTOSCOPY W/ URETERAL STENT PLACEMENT Left 09/22/2015   Procedure: CYSTOSCOPY WITH LEFT  RETROGRADE PYELOGRAM/ STENT PLACEMENT;  Surgeon: Arlena Gal, MD;  Location: [facility];  Service: Urology;  Laterality: Left;   CYSTOSCOPY WITH RETROGRADE PYELOGRAM, URETEROSCOPY AND STENT PLACEMENT Bilateral 02/27/2022   Procedure: CYSTOSCOPY WITH RETROGRADE PYELOGRAM, URETEROSCOPY AND STENT PLACEMENT;  Surgeon: Elisabeth Valli BIRCH, MD;  Location: [facility];  Service: Urology;  Laterality: Bilateral;   HOLMIUM LASER APPLICATION Bilateral 02/27/2022   Procedure: HOLMIUM LASER APPLICATION;  Surgeon: Elisabeth Valli BIRCH, MD;  Location: [facility];  Service: Urology;  Laterality: Bilateral;    LITHOTRIPSY     MOUTH SURGERY     wisdom teeth and gum x2   Patient Active Problem List   Diagnosis Date Noted   Libido, decreased 05/08/2024   Inattention 10/29/2023   Hyponychia left ring finger 07/26/2023   Obesity (BMI 30-39.9) 05/21/2023   Prediabetes 04/02/2023   Thyroid  enlargement 09/20/2021   Superficial venous thrombosis of upper extremity, right 06/13/2021   Family history of DVT 06/13/2021   Family history of pulmonary embolism 06/13/2021   Right wrist pain 11/06/2019   Perennial allergic rhinitis 11/06/2019   Palpitations 09/11/2019   Hair thinning 05/22/2019   Screening for hyperlipidemia 04/27/2019   Cubital tunnel syndrome, left 02/24/2019   DDD (degenerative disc disease), lumbar 02/03/2019   Allergic conjunctivitis with xerophthalmia 01/22/2019   LPRD (laryngopharyngeal reflux disease) 11/25/2017   Migraine headache 07/07/2015   Annual physical exam 05/12/2015   Left nephrolithiasis 05/12/2015   Essential tremor 05/12/2015   Mood disorder (HCC) 05/12/2015   Microcytosis 05/12/2015    PCP: Curtis Debby PARAS, MD  REFERRING PROVIDER: Curtis Debby PARAS, MD  REFERRING DIAG: R68.82 (ICD-10-CM) - Libido, decreased  THERAPY DIAG:  Muscle weakness (generalized)  Unspecified lack of coordination  Rationale for Evaluation and Treatment: Rehabilitation  ONSET DATE: 10/2019  SUBJECTIVE:  SUBJECTIVE STATEMENT: Pt states that inverted lying has been very helpful. She has not started her other exercises.    PAIN:  Are you having pain? Yes NPRS scale: 7/10 Pain location: pain during intercourse  Pain type: tender Pain description: intermittent   Aggravating factors: certain positions during intercourse  Relieving factors: no vaginal penetration  PRECAUTIONS:  None  RED FLAGS: None   WEIGHT BEARING RESTRICTIONS: No  FALLS:  Has patient fallen in last 6 months? No  OCCUPATION: utlrasound technician   ACTIVITY LEVEL : none  PLOF: Independent  PATIENT GOALS: improve intercourse discomfort, decrease urinary incontinence, improve core strength, get active overall   PERTINENT HISTORY:  G4P4, GERD, lumbar DDD Sexual abuse: No  BOWEL MOVEMENT: Pain with bowel movement: No Type of bowel movement:Type (Bristol Stool Scale) 1-2, Frequency 1x/day, and Strain yes Fully empty rectum: No Leakage: No Pads: No Fiber supplement/laxative Yes - Miralax  URINATION: Pain with urination: No Fully empty bladder: Yes:   Stream: Strong Urgency: No Frequency: every 2 hours during the day Fluid Intake: not enough Leakage: Coughing, Sneezing, and Laughing Pads: No  INTERCOURSE:  Ability to have vaginal penetration Yes  Pain with intercourse: Initial Penetration, During Penetration, and Deep Penetration DrynessYes  Climax: less intense and harder to achieve Marinoff Scale: 1/3 Lubricant: yes  PREGNANCY: Vaginal deliveries 4 Tearing Yes:   Episiotomy No C-section deliveries 0 Currently pregnant No  PROLAPSE: Pressure and Bulge   OBJECTIVE:  Note: Objective measures were completed at Evaluation unless otherwise noted.   PATIENT SURVEYS:   PFIQ-7: 72, 33, 38 (combined, bladder, bowel)  COGNITION: Overall cognitive status: Within functional limits for tasks assessed     SENSATION: Light touch: Appears intact   FUNCTIONAL TESTS:  Squat:Rt weight shift  Single leg stance:  Rt: pelvic drop  Lt: stable  Curl-up test: abdominal distortion Sit-up test: 1/3 ACTIVE STRAIGHT LEG RAISE: (+) bil   GAIT: Assistive device utilized: None Comments: WNL  POSTURE: increased lumbar lordosis, anterior pelvic tilt, and Rt posterior rotation   LUMBARAROM/PROM: WNL   PALPATION:   General: WNL  Pelvic Alignment: Rt posterior  rotation  Abdominal: apical breathing pattern                External Perineal Exam: WNL                             Internal Pelvic Floor: WNL - palpation of cervix  Patient confirms identification and approves PT to assess internal pelvic floor and treatment Yes  PELVIC MMT:   MMT eval  Vaginal 2/5, 5 second hold, 3 repeat contractions  Internal Anal Sphincter   External Anal Sphincter   Puborectalis   Diastasis Recti 2-3 finger width separation  (Blank rows = not tested)        TONE: WNL  PROLAPSE: Grade 1 anterior vaginal wall laxity and uterine ligament laxity   TODAY'S TREATMENT:  DATE:  06/15/24 Neuromuscular re-education: Transversus abdominus training with multimodal cues for improved motor control and breath coordination Bil supine UE ball press with transversus abdominus and pelvic floor muscle contractions and breath coordination 10x Supine hip adduction ball press with transversus abdominus and pelvic floor muscle contractions and breath coordination 10x Supine thigh press + transversus abdominus + pelvic floor muscle 10x bil Seated hip adduction ball press with transversus abdominus and pelvic floor muscle 2 x 10 Seated hip abduction green band with transversus abdominus and pelvic floor muscle 2 x 10 Seated resisted march green band with transversus abdominus and pelvic floor muscle 2 x 10 Therapeutic activities: Squat + 3 lb weight hold at 90 degrees of shoulder flexion 2 x 10 to table  Standing shoulder extension green band 2 x 10   06/10/24 EVAL  Neuromuscular re-education: Pt provides verbal consent for internal vaginal/rectal pelvic floor exam. Internal vaginal pelvic floor muscle contraction training Quick flicks Long holds Bridge with hip adduction, transversus abdominus, and pelvic floor muscle 2 x 10 Therapeutic  activities: Inverted lying positions     PATIENT EDUCATION:  Education details: See above Person educated: Patient Education method: Programmer, multimedia, Facilities manager, Actor cues, Verbal cues, and Handouts Education comprehension: verbalized understanding  HOME EXERCISE PROGRAM: [id number]  ASSESSMENT:  CLINICAL IMPRESSION: Patient is a 43 y.o. female who was seen today for physical therapy evaluation and treatment for core weakness, constipation, and stress urinary incontinence. Pt feels like she may already being seeing improvements with vaginal pressure from doing the inverted lying position several times. She was able to start core strengthening with pelvic floor muscle involvement and correct breathing with good tolerance. She did have some pain in Rt posterior hip with lying flat on the table but reports that this may go back to when she broke her tailbone. Lying on pillow helped to reduce pain. Required consistent verbal cues with shoulder extensions in order to keep softness in knees as she demonstrated compensation of full knee extension to avoid core activation. HEP updated. She will continue to benefit from skilled PT intervention in order improve constipation, decrease urinary incontinence, decrease dyspareunia, address all impairments, and improve quality of life.   OBJECTIVE IMPAIRMENTS: decreased activity tolerance, decreased coordination, decreased endurance, decreased mobility, decreased ROM, decreased strength, increased fascial restrictions, increased muscle spasms, impaired flexibility, impaired tone, improper body mechanics, postural dysfunction, and pain.   ACTIVITY LIMITATIONS: lifting and continence  PARTICIPATION LIMITATIONS: interpersonal relationship, community activity, occupation, and exercise  PERSONAL FACTORS: 1 comorbidity: medical history are also affecting patient's functional outcome.   REHAB POTENTIAL: Good  CLINICAL DECISION MAKING:  Stable/uncomplicated  EVALUATION COMPLEXITY: Low   GOALS: Goals reviewed with patient? Yes  SHORT TERM GOALS: Target date: 07/08/2024    Pt will be independent with HEP in order to improve activity tolerance.   Baseline: Goal status: INITIAL  2.  Pt will be able to correctly perform diaphragmatic breathing and appropriate pressure management in order to prevent worsening vaginal wall/uterine ligament laxity and improve pelvic floor A/ROM.    Baseline: grade 1 anterior vaginal wall and uterine ligament laxity Goal status: INITIAL  3.  Pt will have daily bowel movement with no straining and complete evacuation in order to put less pressure on uterine prolapse and improve pressure management.   Baseline: straining and incomplete Goal status: INITIAL  4.  Patient will be able to perform single leg stance with good pelvic stability in order to demonstrate appropriate pelvic floor muscle and transversus abdominus strength and  coordination in order to decrease urinary incontinence.   Baseline:  Goal status: INITIAL   LONG TERM GOALS: Target date: 11/25/2024  Pt will be independent with advanced HEP in order to improve activity tolerance.   Baseline:  Goal status: INITIAL  2.  Pt will report no leaks with laughing, coughing, sneezing in order to improve comfort with interpersonal relationships and community activities.   Baseline: leaking Goal status: INITIAL  3.  Pt will report 0/10 pain with vaginal penetration in order to improve intimate relationship with partner.    Baseline: pain with certain positions  Goal status: INITIAL  4.  Pt will demonstrate 3/3 on sit-up test in order to have improved core strength that will allow her to avoid injury in the future.  Baseline: 1/3 Goal status: INITIAL  5.  Pt will demonstrate no abdominal distortion with curl-up test in order to prevent abnormal forces on pelvis/pelvic floor muscles to decrease risk of worsening prolapse and  bowel/bladder dysfunction. Baseline: distortion Goal status: INITIAL  6.  Pt will demonstrate normal pelvic floor muscle tone and A/ROM, able to achieve 3/5 strength with contractions and 10 sec endurance, in order to reduce urinary leaking and number of pads patient wears.   Baseline: 2/5, 5 second endurance  Goal status: INITIAL  PLAN:  PT FREQUENCY: 1-2x/week  PT DURATION: 16 visits    PLANNED INTERVENTIONS: 97164- PT Re-evaluation, 97110-Therapeutic exercises, 97530- Therapeutic activity, 97112- Neuromuscular re-education, 97535- Self Care, 02859- Manual therapy, [phone]- Gait training, [phone]- Aquatic Therapy, [phone]- Electrical stimulation (unattended), [phone]- Traction (mechanical), [id number]- Ionotophoresis 4mg /ml Dexamethasone , 79439 (1-2 muscles), [id number] (3+ muscles)- Dry Needling, Patient/Family education, Balance training, Taping, Joint mobilization, Joint manipulation, Spinal manipulation, Spinal mobilization, Scar mobilization, Vestibular training, Cryotherapy, Moist heat, and Biofeedback  PLAN FOR NEXT SESSION: core strengthening; pressure management   Josette Mares, PT, DPT08/25/2511:41 AM

## 2024-06-16 MED ORDER — XIIDRA 5 % OP SOLN: 1.0000 [drp] | Freq: Two times a day (BID) | OPHTHALMIC | 1 refills | Status: DC

## 2024-06-17 ENCOUNTER — Telehealth: Payer: Self-pay

## 2024-06-17 ENCOUNTER — Other Ambulatory Visit (HOSPITAL_COMMUNITY): Payer: Self-pay

## 2024-06-17 NOTE — Telephone Encounter (Signed)
 Pharmacy Patient Advocate Encounter   Received notification from Onbase that prior authorization for Xiidra  5% eye drops is required/requested.   Insurance verification completed.   The patient is insured through [facility] .   Per test claim: PA required; PA started via CoverMyMeds. KEY [id number] . Please see clinical question(s) below that I am not finding the answer to in their chart and advise.  *I need the diagnois code and chart notes in order to submit prior authorization.

## 2024-06-23 ENCOUNTER — Encounter: Payer: Self-pay | Admitting: Sports Medicine

## 2024-06-25 NOTE — Telephone Encounter (Signed)
 Routing to the covering provider. The dx for the rx is H10.10 (allergic conjunctivitis w/ xerophthalmia). No clinical notes available. Please advise, thanks.

## 2024-07-01 ENCOUNTER — Other Ambulatory Visit (HOSPITAL_COMMUNITY): Payer: Self-pay

## 2024-07-02 ENCOUNTER — Encounter: Payer: 59 | Admitting: Sports Medicine

## 2024-07-17 ENCOUNTER — Encounter: Admitting: Physician Assistant

## 2024-07-20 ENCOUNTER — Ambulatory Visit: Attending: Sports Medicine

## 2024-07-20 ENCOUNTER — Encounter: Payer: Self-pay | Admitting: Physician Assistant

## 2024-07-20 ENCOUNTER — Ambulatory Visit: Admitting: Physician Assistant

## 2024-07-20 VITALS — BP 121/82 | HR 71 | Ht 64.0 in | Wt 137.0 lb

## 2024-07-20 DIAGNOSIS — Z23 Encounter for immunization: Secondary | ICD-10-CM | POA: Diagnosis not present

## 2024-07-20 DIAGNOSIS — R7303 Prediabetes: Secondary | ICD-10-CM | POA: Diagnosis not present

## 2024-07-20 DIAGNOSIS — H04129 Dry eye syndrome of unspecified lacrimal gland: Secondary | ICD-10-CM | POA: Diagnosis not present

## 2024-07-20 DIAGNOSIS — Z8349 Family history of other endocrine, nutritional and metabolic diseases: Secondary | ICD-10-CM

## 2024-07-20 DIAGNOSIS — K5909 Other constipation: Secondary | ICD-10-CM | POA: Diagnosis not present

## 2024-07-20 DIAGNOSIS — E669 Obesity, unspecified: Secondary | ICD-10-CM

## 2024-07-20 DIAGNOSIS — Z13220 Encounter for screening for lipoid disorders: Secondary | ICD-10-CM | POA: Diagnosis not present

## 2024-07-20 DIAGNOSIS — M6281 Muscle weakness (generalized): Secondary | ICD-10-CM | POA: Diagnosis present

## 2024-07-20 DIAGNOSIS — Z8639 Personal history of other endocrine, nutritional and metabolic disease: Secondary | ICD-10-CM

## 2024-07-20 DIAGNOSIS — R279 Unspecified lack of coordination: Secondary | ICD-10-CM | POA: Insufficient documentation

## 2024-07-20 DIAGNOSIS — Z7689 Persons encountering health services in other specified circumstances: Secondary | ICD-10-CM | POA: Insufficient documentation

## 2024-07-20 DIAGNOSIS — Z79899 Other long term (current) drug therapy: Secondary | ICD-10-CM

## 2024-07-20 MED ORDER — XIIDRA 5 % OP SOLN: 1.0000 [drp] | Freq: Two times a day (BID) | OPHTHALMIC | 2 refills | Status: AC

## 2024-07-20 MED ORDER — MOUNJARO 5 MG/0.5ML ~~LOC~~ SOAJ: 5.0000 mg | SUBCUTANEOUS | 11 refills | Status: AC

## 2024-07-20 NOTE — Therapy (Signed)
 OUTPATIENT PHYSICAL THERAPY FEMALE PELVIC TREATMENT   Patient Name: Charlotte Nelson MRN: [id number] DOB:[birth year], 43 y.o., female Today's Date: 07/20/2024  END OF SESSION:  PT End of Session - 07/20/24 1401     Visit Number 3    Date for Recertification  11/25/24    Authorization Type [employer]    PT Start Time 1400    PT Stop Time 1441    PT Time Calculation (min) 41 min    Activity Tolerance Patient tolerated treatment well    Behavior During Therapy WFL for tasks assessed/performed          Past Medical History:  Diagnosis Date   Alopecia    Anemia    not currently   Anxiety    Depression    Family history of adverse reaction to anesthesia    PATERNAL GRANDFATHER HAD UNKNOWN PROBLEM  MANY YRS AGO - IT WASN'T A SERIOUS PROBLEM   Gastritis    GERD (gastroesophageal reflux disease)    Gestational diabetes    metformin  and lantus insulin   Gestational diabetes mellitus, class A2 07/07/2011   does not normally have diabetes   Headache(784.0)    migraine   Hypertension in pregnancy, preeclampsia, delivered 11/17/2020   Kidney stones    OCD (obsessive compulsive disorder)    Pregnancy induced hypertension    nifedipine    Tremor    familial tremor   Past Surgical History:  Procedure Laterality Date   CYSTOSCOPY W/ URETERAL STENT PLACEMENT Left 09/22/2015   Procedure: CYSTOSCOPY WITH LEFT  RETROGRADE PYELOGRAM/ STENT PLACEMENT;  Surgeon: Arlena Gal, MD;  Location: [facility];  Service: Urology;  Laterality: Left;   CYSTOSCOPY WITH RETROGRADE PYELOGRAM, URETEROSCOPY AND STENT PLACEMENT Bilateral 02/27/2022   Procedure: CYSTOSCOPY WITH RETROGRADE PYELOGRAM, URETEROSCOPY AND STENT PLACEMENT;  Surgeon: Elisabeth Valli BIRCH, MD;  Location: [facility];  Service: Urology;  Laterality: Bilateral;   HOLMIUM LASER APPLICATION Bilateral 02/27/2022   Procedure: HOLMIUM LASER APPLICATION;  Surgeon: Elisabeth Valli BIRCH, MD;  Location: [facility];  Service: Urology;  Laterality: Bilateral;    LITHOTRIPSY     MOUTH SURGERY     wisdom teeth and gum x2   Patient Active Problem List   Diagnosis Date Noted   Establishing care with new doctor, encounter for 07/20/2024   Libido, decreased 05/08/2024   Inattention 10/29/2023   Hyponychia left ring finger 07/26/2023   Obesity (BMI 30-39.9) 05/21/2023   Prediabetes 04/02/2023   Thyroid  enlargement 09/20/2021   Superficial venous thrombosis of upper extremity, right 06/13/2021   Family history of DVT 06/13/2021   Family history of pulmonary embolism 06/13/2021   Right wrist pain 11/06/2019   Perennial allergic rhinitis 11/06/2019   Palpitations 09/11/2019   Hair thinning 05/22/2019   Screening for hyperlipidemia 04/27/2019   Cubital tunnel syndrome, left 02/24/2019   DDD (degenerative disc disease), lumbar 02/03/2019   Allergic conjunctivitis with xerophthalmia 01/22/2019   LPRD (laryngopharyngeal reflux disease) 11/25/2017   Migraine headache 07/07/2015   Annual physical exam 05/12/2015   Left nephrolithiasis 05/12/2015   Essential tremor 05/12/2015   Mood disorder 05/12/2015   Microcytosis 05/12/2015    PCP: Curtis Debby PARAS, MD  REFERRING PROVIDER: Curtis Debby PARAS, MD  REFERRING DIAG: R68.82 (ICD-10-CM) - Libido, decreased  THERAPY DIAG:  Muscle weakness (generalized)  Unspecified lack of coordination  Rationale for Evaluation and Treatment: Rehabilitation  ONSET DATE: 10/2019  SUBJECTIVE:  SUBJECTIVE STATEMENT: Pt states that inverted lying has been very helpful. She has not started her other exercises.    PAIN:  Are you having pain? Yes NPRS scale: 7/10 Pain location: pain during intercourse  Pain type: tender Pain description: intermittent   Aggravating factors: certain positions during intercourse   Relieving factors: no vaginal penetration  PRECAUTIONS: None  RED FLAGS: None   WEIGHT BEARING RESTRICTIONS: No  FALLS:  Has patient fallen in last 6 months? No  OCCUPATION: utlrasound technician   ACTIVITY LEVEL : none  PLOF: Independent  PATIENT GOALS: improve intercourse discomfort, decrease urinary incontinence, improve core strength, get active overall   PERTINENT HISTORY:  G4P4, GERD, lumbar DDD Sexual abuse: No  BOWEL MOVEMENT: Pain with bowel movement: No Type of bowel movement:Type (Bristol Stool Scale) 1-2, Frequency 1x/day, and Strain yes Fully empty rectum: No Leakage: No Pads: No Fiber supplement/laxative Yes - Miralax  URINATION: Pain with urination: No Fully empty bladder: Yes:   Stream: Strong Urgency: No Frequency: every 2 hours during the day Fluid Intake: not enough Leakage: Coughing, Sneezing, and Laughing Pads: No  INTERCOURSE:  Ability to have vaginal penetration Yes  Pain with intercourse: Initial Penetration, During Penetration, and Deep Penetration DrynessYes  Climax: less intense and harder to achieve Marinoff Scale: 1/3 Lubricant: yes  PREGNANCY: Vaginal deliveries 4 Tearing Yes:   Episiotomy No C-section deliveries 0 Currently pregnant No  PROLAPSE: Pressure and Bulge   OBJECTIVE:  Note: Objective measures were completed at Evaluation unless otherwise noted.   PATIENT SURVEYS:   PFIQ-7: 72, 33, 38 (combined, bladder, bowel)  COGNITION: Overall cognitive status: Within functional limits for tasks assessed     SENSATION: Light touch: Appears intact   FUNCTIONAL TESTS:  Squat:Rt weight shift  Single leg stance:  Rt: pelvic drop  Lt: stable  Curl-up test: abdominal distortion Sit-up test: 1/3 ACTIVE STRAIGHT LEG RAISE: (+) bil   GAIT: Assistive device utilized: None Comments: WNL  POSTURE: increased lumbar lordosis, anterior pelvic tilt, and Rt posterior rotation   LUMBARAROM/PROM:  WNL   PALPATION:   General: WNL  Pelvic Alignment: Rt posterior rotation  Abdominal: apical breathing pattern                External Perineal Exam: WNL                             Internal Pelvic Floor: WNL - palpation of cervix  Patient confirms identification and approves PT to assess internal pelvic floor and treatment Yes  PELVIC MMT:   MMT eval  Vaginal 2/5, 5 second hold, 3 repeat contractions  Internal Anal Sphincter   External Anal Sphincter   Puborectalis   Diastasis Recti 2-3 finger width separation  (Blank rows = not tested)        TONE: WNL  PROLAPSE: Grade 1 anterior vaginal wall laxity and uterine ligament laxity   TODAY'S TREATMENT:  DATE:  07/20/24 Neuromuscular re-education: Bridge with hip adduction, transversus abdominus, and pelvic floor muscle 2 x 10 Supine thigh press + transversus abdominus + pelvic floor muscle 10x bil Supine leg extensions 10x bil Modified dead bug 10x bil Bird dog 12x Bear plank 10x Seated hip adduction ball press with transversus abdominus and pelvic floor muscle 2 x 10 Seated hip abduction red band with transversus abdominus and pelvic floor muscle 2 x 10 Seated resisted march red band with transversus abdominus and pelvic floor muscle 2 x 10 Seated unilateral hip abduction + red band 10x bil Exercises: Cat cow 2 x 10 Child's pose 10 breaths    06/15/24 Neuromuscular re-education: Transversus abdominus training with multimodal cues for improved motor control and breath coordination Bil supine UE ball press with transversus abdominus and pelvic floor muscle contractions and breath coordination 10x Supine hip adduction ball press with transversus abdominus and pelvic floor muscle contractions and breath coordination 10x Supine thigh press + transversus abdominus + pelvic floor muscle 10x  bil Seated hip adduction ball press with transversus abdominus and pelvic floor muscle 2 x 10 Seated hip abduction green band with transversus abdominus and pelvic floor muscle 2 x 10 Seated resisted march green band with transversus abdominus and pelvic floor muscle 2 x 10 Therapeutic activities: Squat + 3 lb weight hold at 90 degrees of shoulder flexion 2 x 10 to table  Standing shoulder extension green band 2 x 10   06/10/24 EVAL  Neuromuscular re-education: Pt provides verbal consent for internal vaginal/rectal pelvic floor exam. Internal vaginal pelvic floor muscle contraction training Quick flicks Long holds Bridge with hip adduction, transversus abdominus, and pelvic floor muscle 2 x 10 Therapeutic activities: Inverted lying positions     PATIENT EDUCATION:  Education details: See above Person educated: Patient Education method: Programmer, multimedia, Facilities manager, Actor cues, Verbal cues, and Handouts Education comprehension: verbalized understanding  HOME EXERCISE PROGRAM: [id number]  ASSESSMENT:  CLINICAL IMPRESSION: Patient is a 43 y.o. female who was seen today for physical therapy evaluation and treatment for core weakness, constipation, and stress urinary incontinence. Pt doing well overall. She has been trying to work on exercises more regularly, but has been sick recently and stopped. She did very well previous exercise review and progressions to include more challenging core activities. However, she does require cues for appropriate breathing and core activation. She will continue to benefit from skilled PT intervention in order improve constipation, decrease urinary incontinence, decrease dyspareunia, address all impairments, and improve quality of life.   OBJECTIVE IMPAIRMENTS: decreased activity tolerance, decreased coordination, decreased endurance, decreased mobility, decreased ROM, decreased strength, increased fascial restrictions, increased muscle spasms, impaired  flexibility, impaired tone, improper body mechanics, postural dysfunction, and pain.   ACTIVITY LIMITATIONS: lifting and continence  PARTICIPATION LIMITATIONS: interpersonal relationship, community activity, occupation, and exercise  PERSONAL FACTORS: 1 comorbidity: medical history are also affecting patient's functional outcome.   REHAB POTENTIAL: Good  CLINICAL DECISION MAKING: Stable/uncomplicated  EVALUATION COMPLEXITY: Low   GOALS: Goals reviewed with patient? Yes  SHORT TERM GOALS: Target date: 07/20/24    Pt will be independent with HEP in order to improve activity tolerance.   Baseline: Goal status: MET 07/20/24  2.  Pt will be able to correctly perform diaphragmatic breathing and appropriate pressure management in order to prevent worsening vaginal wall/uterine ligament laxity and improve pelvic floor A/ROM.    Baseline: grade 1 anterior vaginal wall and uterine ligament laxity Goal status: IN PROGRESS 07/20/24  3.  Pt will have  daily bowel movement with no straining and complete evacuation in order to put less pressure on uterine prolapse and improve pressure management.   Baseline: straining and incomplete Goal status:  IN PROGRESS 07/20/24  4.  Patient will be able to perform single leg stance with good pelvic stability in order to demonstrate appropriate pelvic floor muscle and transversus abdominus strength and coordination in order to decrease urinary incontinence.   Baseline:  Goal status:  IN PROGRESS 07/20/24   LONG TERM GOALS: Target date: 07/20/24  Pt will be independent with advanced HEP in order to improve activity tolerance.   Baseline:  Goal status:  IN PROGRESS 07/20/24  2.  Pt will report no leaks with laughing, coughing, sneezing in order to improve comfort with interpersonal relationships and community activities.   Baseline: leaking Goal status:  IN PROGRESS 07/20/24  3.  Pt will report 0/10 pain with vaginal penetration in order to improve  intimate relationship with partner.    Baseline: pain with certain positions  Goal status:  IN PROGRESS 07/20/24  4.  Pt will demonstrate 3/3 on sit-up test in order to have improved core strength that will allow her to avoid injury in the future.  Baseline: 1/3 Goal status:  IN PROGRESS 07/20/24  5.  Pt will demonstrate no abdominal distortion with curl-up test in order to prevent abnormal forces on pelvis/pelvic floor muscles to decrease risk of worsening prolapse and bowel/bladder dysfunction. Baseline: distortion Goal status:  IN PROGRESS 07/20/24  6.  Pt will demonstrate normal pelvic floor muscle tone and A/ROM, able to achieve 3/5 strength with contractions and 10 sec endurance, in order to reduce urinary leaking and number of pads patient wears.   Baseline: 2/5, 5 second endurance  Goal status:  IN PROGRESS 07/20/24  PLAN:  PT FREQUENCY: 1-2x/week  PT DURATION: 16 visits    PLANNED INTERVENTIONS: 97164- PT Re-evaluation, 97110-Therapeutic exercises, 97530- Therapeutic activity, 97112- Neuromuscular re-education, 97535- Self Care, 02859- Manual therapy, [phone]- Gait training, [phone]- Aquatic Therapy, [phone]- Electrical stimulation (unattended), [phone]- Traction (mechanical), [id number]- Ionotophoresis 4mg /ml Dexamethasone , 79439 (1-2 muscles), 20561 (3+ muscles)- Dry Needling, Patient/Family education, Balance training, Taping, Joint mobilization, Joint manipulation, Spinal manipulation, Spinal mobilization, Scar mobilization, Vestibular training, Cryotherapy, Moist heat, and Biofeedback  PLAN FOR NEXT SESSION: core strengthening; pressure management   Josette Mares, PT, DPT09/29/252:02 PM

## 2024-07-20 NOTE — Patient Instructions (Signed)

## 2024-07-20 NOTE — Progress Notes (Unsigned)
 New Patient Office Visit  Subjective    Patient ID: Charlotte Nelson, female    DOB: 1980-12-07  Age: 43 y.o. MRN: [id number]  CC: No chief complaint on file.   HPI Abeera Flannery presents to establish care   Outpatient Encounter Medications as of 07/20/2024  Medication Sig   cetirizine (ZYRTEC) 10 MG tablet Take 10 mg by mouth at bedtime as needed for allergies.   Cholecalciferol (VITAMIN D -3) 125 MCG (5000 UT) TABS Take 5,000 Units by mouth at bedtime.   Lifitegrast  (XIIDRA ) 5 % SOLN Apply 1 drop to eye every 12 (twelve) hours.   Magnesium 125 MG CAPS Take 125 mg by mouth at bedtime.   Multiple Vitamin (MULTIVITAMIN WITH MINERALS) TABS tablet Take 1 tablet by mouth at bedtime.   ondansetron  (ZOFRAN -ODT) 8 MG disintegrating tablet Take 1 tablet (8 mg total) by mouth every 8 (eight) hours as needed for nausea.   Probiotic Product (PROBIOTIC PO) Take 1 tablet by mouth at bedtime.   tirzepatide  (MOUNJARO ) 5 MG/0.5ML Pen Inject 5 mg into the skin once a week.   No facility-administered encounter medications on file as of 07/20/2024.    Past Medical History:  Diagnosis Date   Alopecia    Anemia    not currently   Anxiety    Depression    Family history of adverse reaction to anesthesia    PATERNAL GRANDFATHER HAD UNKNOWN PROBLEM  MANY YRS AGO - IT WASN'T A SERIOUS PROBLEM   Gastritis    GERD (gastroesophageal reflux disease)    Gestational diabetes    metformin  and lantus insulin   Gestational diabetes mellitus, class A2 07/07/2011   does not normally have diabetes   Headache(784.0)    migraine   Hypertension in pregnancy, preeclampsia, delivered 11/17/2020   Kidney stones    OCD (obsessive compulsive disorder)    Pregnancy induced hypertension    nifedipine    Tremor    familial tremor    Past Surgical History:  Procedure Laterality Date   CYSTOSCOPY W/ URETERAL STENT PLACEMENT Left 09/22/2015   Procedure: CYSTOSCOPY WITH LEFT  RETROGRADE PYELOGRAM/ STENT  PLACEMENT;  Surgeon: Arlena Gal, MD;  Location: [facility];  Service: Urology;  Laterality: Left;   CYSTOSCOPY WITH RETROGRADE PYELOGRAM, URETEROSCOPY AND STENT PLACEMENT Bilateral 02/27/2022   Procedure: CYSTOSCOPY WITH RETROGRADE PYELOGRAM, URETEROSCOPY AND STENT PLACEMENT;  Surgeon: Elisabeth Valli BIRCH, MD;  Location: [facility];  Service: Urology;  Laterality: Bilateral;   HOLMIUM LASER APPLICATION Bilateral 02/27/2022   Procedure: HOLMIUM LASER APPLICATION;  Surgeon: Elisabeth Valli BIRCH, MD;  Location: [facility];  Service: Urology;  Laterality: Bilateral;   LITHOTRIPSY     MOUTH SURGERY     wisdom teeth and gum x2    Family History  Problem Relation Age of Onset   Migraines Mother    Thyroid  disease Mother    Hypertension Mother    Miscarriages / [place] Mother    Hyperlipidemia Mother    Urolithiasis Father    Hypertension Father    Diabetes Father    Nephrolithiasis Father    Thrombosis Father    Tremor Father    Deep vein thrombosis Father    Stroke Maternal Grandmother    Cancer Maternal Grandmother        lung ca   COPD Maternal Grandmother    Heart disease Maternal Grandmother    Diabetes Maternal Grandmother    Depression Maternal Grandmother    Alcohol abuse Maternal Grandfather    Heart disease Maternal Grandfather  Stroke Maternal Grandfather    Pulmonary embolism Maternal Grandfather    Stroke Paternal Grandmother    Diabetes Paternal Grandmother    Tremor Paternal Grandmother    Heart disease Paternal Grandfather    Diabetes Paternal Grandfather    Parkinson's disease Paternal Grandfather     Social History   Socioeconomic History   Marital status: Married    Spouse name: Not on file   Number of children: Not on file   Years of education: Not on file   Highest education level: Bachelor's degree (e.g., BA, AB, BS)  Occupational History   Not on file  Tobacco Use   Smoking status: Never   Smokeless tobacco: Never  Vaping Use   Vaping status: Never  Used  Substance and Sexual Activity   Alcohol use: Not Currently    Comment: occasional   Drug use: No   Sexual activity: Yes    Birth control/protection: Pill  Other Topics Concern   Not on file  Social History Narrative   Not on file   Social Drivers of Health   Financial Resource Strain: Low Risk  (07/03/2023)   Overall Financial Resource Strain (CARDIA)    Difficulty of Paying Living Expenses: Not very hard  Food Insecurity: No Food Insecurity (07/03/2023)   Hunger Vital Sign    Worried About Running Out of Food in the Last Year: Never true    Ran Out of Food in the Last Year: Never true  Transportation Needs: No Transportation Needs (07/03/2023)   PRAPARE - Administrator, Civil Service (Medical): No    Lack of Transportation (Non-Medical): No  Physical Activity: Insufficiently Active (07/03/2023)   Exercise Vital Sign    Days of Exercise per Week: 4 days    Minutes of Exercise per Session: 30 min  Stress: No Stress Concern Present (07/03/2023)   [employer] of Occupational Health - Occupational Stress Questionnaire    Feeling of Stress : Only a little  Social Connections: Moderately Integrated (07/03/2023)   Social Connection and Isolation Panel    Frequency of Communication with Friends and Family: More than three times a week    Frequency of Social Gatherings with Friends and Family: Once a week    Attends Religious Services: More than 4 times per year    Active Member of [employer] or Organizations: No    Attends Banker Meetings: Not on file    Marital Status: Married  Intimate Partner Violence: Unknown (01/25/2022)   Received from [facility]   HITS    Physically Hurt: Not on file    Insult or Talk Down To: Not on file    Threaten Physical Harm: Not on file    Scream or Curse: Not on file    ROS      Objective    There were no vitals taken for this visit.  Physical Exam  {Labs (Optional):23779}    Assessment & Plan:    Problem List Items Addressed This Visit   None   No follow-ups on file.   Sheri Prows, PA-C

## 2024-07-21 ENCOUNTER — Ambulatory Visit: Payer: Self-pay | Admitting: Physician Assistant

## 2024-07-21 DIAGNOSIS — E669 Obesity, unspecified: Secondary | ICD-10-CM

## 2024-07-21 DIAGNOSIS — K5909 Other constipation: Secondary | ICD-10-CM | POA: Insufficient documentation

## 2024-07-21 DIAGNOSIS — Z8349 Family history of other endocrine, nutritional and metabolic diseases: Secondary | ICD-10-CM | POA: Insufficient documentation

## 2024-07-21 DIAGNOSIS — H04129 Dry eye syndrome of unspecified lacrimal gland: Secondary | ICD-10-CM | POA: Insufficient documentation

## 2024-07-21 LAB — CBC WITH DIFFERENTIAL/PLATELET
Basophils Absolute: 0 x10E3/uL (ref 0.0–0.2)
Basos: 1 %
EOS (ABSOLUTE): 0.1 x10E3/uL (ref 0.0–0.4)
Eos: 1 %
Hematocrit: 39.1 % (ref 34.0–46.6)
Hemoglobin: 12.7 g/dL (ref 11.1–15.9)
Immature Grans (Abs): 0 x10E3/uL (ref 0.0–0.1)
Immature Granulocytes: 0 %
Lymphocytes Absolute: 2.8 x10E3/uL (ref 0.7–3.1)
Lymphs: 39 %
MCH: 25.9 pg — ABNORMAL LOW (ref 26.6–33.0)
MCHC: 32.5 g/dL (ref 31.5–35.7)
MCV: 80 fL (ref 79–97)
Monocytes Absolute: 0.4 x10E3/uL (ref 0.1–0.9)
Monocytes: 6 %
Neutrophils Absolute: 3.8 x10E3/uL (ref 1.4–7.0)
Neutrophils: 53 %
Platelets: 315 x10E3/uL (ref 150–450)
RBC: 4.9 x10E6/uL (ref 3.77–5.28)
RDW: 13.6 % (ref 11.7–15.4)
WBC: 7.1 x10E3/uL (ref 3.4–10.8)

## 2024-07-21 LAB — CMP14+EGFR
ALT: 12 IU/L (ref 0–32)
AST: 12 IU/L (ref 0–40)
Albumin: 4.2 g/dL (ref 3.9–4.9)
Alkaline Phosphatase: 60 IU/L (ref 41–116)
BUN/Creatinine Ratio: 26 — ABNORMAL HIGH (ref 9–23)
BUN: 18 mg/dL (ref 6–24)
Bilirubin Total: 0.4 mg/dL (ref 0.0–1.2)
CO2: 19 mmol/L — ABNORMAL LOW (ref 20–29)
Calcium: 9.1 mg/dL (ref 8.7–10.2)
Chloride: 106 mmol/L (ref 96–106)
Creatinine, Ser: 0.7 mg/dL (ref 0.57–1.00)
Globulin, Total: 2.4 g/dL (ref 1.5–4.5)
Glucose: 89 mg/dL (ref 70–99)
Potassium: 4.1 mmol/L (ref 3.5–5.2)
Sodium: 140 mmol/L (ref 134–144)
Total Protein: 6.6 g/dL (ref 6.0–8.5)
eGFR: 111 mL/min/1.73 (ref >59)

## 2024-07-21 LAB — TSH+FREE T4
Free T4: 1.35 ng/dL (ref 0.82–1.77)
TSH: 1.28 u[IU]/mL (ref 0.450–4.500)

## 2024-07-21 LAB — LIPID PANEL
Chol/HDL Ratio: 3.7 ratio (ref 0.0–4.4)
Cholesterol, Total: 192 mg/dL (ref 100–199)
HDL: 52 mg/dL (ref >39)
LDL Chol Calc (NIH): 129 mg/dL — ABNORMAL HIGH (ref 0–99)
Triglycerides: 61 mg/dL (ref 0–149)
VLDL Cholesterol Cal: 11 mg/dL (ref 5–40)

## 2024-07-21 LAB — HEMOGLOBIN A1C
Est. average glucose Bld gHb Est-mCnc: 111 mg/dL
Hgb A1c MFr Bld: 5.5 % (ref 4.8–5.6)

## 2024-07-21 LAB — VITAMIN D 25 HYDROXY (VIT D DEFICIENCY, FRACTURES): Vit D, 25-Hydroxy: 41.7 ng/mL (ref 30.0–100.0)

## 2024-07-21 NOTE — Progress Notes (Signed)
 Charlotte Nelson,   Normal hemoglobin.  Kidney and liver function look good.  Vitamin D  normal.  A1C in normal range.  Thyroid  looks good.  LDL up quite a bit from 1 year ago.  Make sure getting exercise regularly. Avoid processed/fried/fatty foods.  Recheck in one year.

## 2024-07-30 NOTE — Telephone Encounter (Signed)
 Requesting rx rf of zofan 8mg   Last written 07/04/2023 Last OV 07/20/2024 Upcoming appt 01/13/2025

## 2024-07-31 MED ORDER — ONDANSETRON 8 MG PO TBDP: 8.0000 mg | ORAL_TABLET | Freq: Three times a day (TID) | ORAL | 3 refills | Status: AC | PRN

## 2024-08-03 ENCOUNTER — Ambulatory Visit

## 2024-08-13 ENCOUNTER — Encounter: Payer: Self-pay | Admitting: Physician Assistant

## 2024-08-13 DIAGNOSIS — Z111 Encounter for screening for respiratory tuberculosis: Secondary | ICD-10-CM

## 2024-08-20 ENCOUNTER — Ambulatory Visit (HOSPITAL_COMMUNITY)
Admission: RE | Admit: 2024-08-20 | Discharge: 2024-08-20 | Disposition: A | Discharge status: Home / Self Care | Source: Ambulatory Visit | Attending: Gastroenterology | Admitting: Gastroenterology

## 2024-08-20 ENCOUNTER — Other Ambulatory Visit (HOSPITAL_COMMUNITY): Payer: Self-pay | Admitting: Gastroenterology

## 2024-08-20 DIAGNOSIS — K5900 Constipation, unspecified: Secondary | ICD-10-CM

## 2024-08-24 ENCOUNTER — Ambulatory Visit: Attending: Sports Medicine

## 2024-08-24 DIAGNOSIS — R279 Unspecified lack of coordination: Secondary | ICD-10-CM | POA: Diagnosis present

## 2024-08-24 DIAGNOSIS — M6281 Muscle weakness (generalized): Secondary | ICD-10-CM | POA: Diagnosis present

## 2024-08-24 NOTE — Therapy (Signed)
 OUTPATIENT PHYSICAL THERAPY FEMALE PELVIC TREATMENT   Patient Name: Nevin Kozuch MRN: [id number] DOB:1981/03/26, 43 y.o., female Today's Date: 08/24/2024  END OF SESSION:  PT End of Session - 08/24/24 1021     Visit Number 4    Date for Recertification  11/25/24    Authorization Type [employer]    PT Start Time 1017    PT Stop Time 1057    PT Time Calculation (min) 40 min    Activity Tolerance Patient tolerated treatment well    Behavior During Therapy WFL for tasks assessed/performed          Past Medical History:  Diagnosis Date   Alopecia    Anemia    not currently   Anxiety    Depression    Family history of adverse reaction to anesthesia    PATERNAL GRANDFATHER HAD UNKNOWN PROBLEM  MANY YRS AGO - IT WASN'T A SERIOUS PROBLEM   Gastritis    GERD (gastroesophageal reflux disease)    Gestational diabetes    metformin  and lantus insulin   Gestational diabetes mellitus, class A2 07/07/2011   does not normally have diabetes   Headache(784.0)    migraine   Hypertension in pregnancy, preeclampsia, delivered 11/17/2020   Kidney stones    OCD (obsessive compulsive disorder)    Pregnancy induced hypertension    nifedipine    Tremor    familial tremor   Past Surgical History:  Procedure Laterality Date   CYSTOSCOPY W/ URETERAL STENT PLACEMENT Left 09/22/2015   Procedure: CYSTOSCOPY WITH LEFT  RETROGRADE PYELOGRAM/ STENT PLACEMENT;  Surgeon: Arlena Gal, MD;  Location: [facility];  Service: Urology;  Laterality: Left;   CYSTOSCOPY WITH RETROGRADE PYELOGRAM, URETEROSCOPY AND STENT PLACEMENT Bilateral 02/27/2022   Procedure: CYSTOSCOPY WITH RETROGRADE PYELOGRAM, URETEROSCOPY AND STENT PLACEMENT;  Surgeon: Elisabeth Valli BIRCH, MD;  Location: [facility];  Service: Urology;  Laterality: Bilateral;   HOLMIUM LASER APPLICATION Bilateral 02/27/2022   Procedure: HOLMIUM LASER APPLICATION;  Surgeon: Elisabeth Valli BIRCH, MD;  Location: [facility];  Service: Urology;  Laterality: Bilateral;    LITHOTRIPSY     MOUTH SURGERY     wisdom teeth and gum x2   Patient Active Problem List   Diagnosis Date Noted   Chronic constipation 07/21/2024   Family history of thyroid  disease 07/21/2024   Dry eye 07/21/2024   Establishing care with new doctor, encounter for 07/20/2024   Libido, decreased 05/08/2024   Inattention 10/29/2023   Hyponychia left ring finger 07/26/2023   Obesity (BMI 30-39.9) 05/21/2023   Prediabetes 04/02/2023   Thyroid  enlargement 09/20/2021   Superficial venous thrombosis of upper extremity, right 06/13/2021   Family history of DVT 06/13/2021   Family history of pulmonary embolism 06/13/2021   Right wrist pain 11/06/2019   Perennial allergic rhinitis 11/06/2019   Palpitations 09/11/2019   Hair thinning 05/22/2019   Screening for hyperlipidemia 04/27/2019   Cubital tunnel syndrome, left 02/24/2019   DDD (degenerative disc disease), lumbar 02/03/2019   Allergic conjunctivitis with xerophthalmia 01/22/2019   LPRD (laryngopharyngeal reflux disease) 11/25/2017   Migraine headache 07/07/2015   Annual physical exam 05/12/2015   Left nephrolithiasis 05/12/2015   Essential tremor 05/12/2015   Mood disorder 05/12/2015   Microcytosis 05/12/2015    PCP: Curtis Debby PARAS, MD  REFERRING PROVIDER: Curtis Debby PARAS, MD  REFERRING DIAG: R68.82 (ICD-10-CM) - Libido, decreased  THERAPY DIAG:  Muscle weakness (generalized)  Unspecified lack of coordination  Rationale for Evaluation and Treatment: Rehabilitation  ONSET DATE: 10/2019  SUBJECTIVE:  SUBJECTIVE STATEMENT: Pt has been to see GI doctor and been diagnosed with chronic idiopathetic constipation. She has been started on extra fiber and is really working in more water. She is scheduled for colonoscopy at the end of  the year.    PAIN:  Are you having pain? Yes NPRS scale: 7/10 Pain location: pain during intercourse  Pain type: tender Pain description: intermittent   Aggravating factors: certain positions during intercourse  Relieving factors: no vaginal penetration  PRECAUTIONS: None  RED FLAGS: None   WEIGHT BEARING RESTRICTIONS: No  FALLS:  Has patient fallen in last 6 months? No  OCCUPATION: utlrasound technician   ACTIVITY LEVEL : none  PLOF: Independent  PATIENT GOALS: improve intercourse discomfort, decrease urinary incontinence, improve core strength, get active overall   PERTINENT HISTORY:  G4P4, GERD, lumbar DDD Sexual abuse: No  BOWEL MOVEMENT: Pain with bowel movement: No Type of bowel movement:Type (Bristol Stool Scale) 1-2, Frequency 1x/day, and Strain yes Fully empty rectum: No Leakage: No Pads: No Fiber supplement/laxative Yes - Miralax  URINATION: Pain with urination: No Fully empty bladder: Yes:   Stream: Strong Urgency: No Frequency: every 2 hours during the day Fluid Intake: not enough Leakage: Coughing, Sneezing, and Laughing Pads: No  INTERCOURSE:  Ability to have vaginal penetration Yes  Pain with intercourse: Initial Penetration, During Penetration, and Deep Penetration DrynessYes  Climax: less intense and harder to achieve Marinoff Scale: 1/3 Lubricant: yes  PREGNANCY: Vaginal deliveries 4 Tearing Yes:   Episiotomy No C-section deliveries 0 Currently pregnant No  PROLAPSE: Pressure and Bulge   OBJECTIVE:  Note: Objective measures were completed at Evaluation unless otherwise noted.   PATIENT SURVEYS:   PFIQ-7: 72, 33, 38 (combined, bladder, bowel)  COGNITION: Overall cognitive status: Within functional limits for tasks assessed     SENSATION: Light touch: Appears intact   FUNCTIONAL TESTS:  Squat:Rt weight shift  Single leg stance:  Rt: pelvic drop  Lt: stable  Curl-up test: abdominal distortion Sit-up test:  1/3 ACTIVE STRAIGHT LEG RAISE: (+) bil   GAIT: Assistive device utilized: None Comments: WNL  POSTURE: increased lumbar lordosis, anterior pelvic tilt, and Rt posterior rotation   LUMBARAROM/PROM: WNL   PALPATION:   General: WNL  Pelvic Alignment: Rt posterior rotation  Abdominal: apical breathing pattern                External Perineal Exam: WNL                             Internal Pelvic Floor: WNL - palpation of cervix  Patient confirms identification and approves PT to assess internal pelvic floor and treatment Yes  PELVIC MMT:   MMT eval  Vaginal 2/5, 5 second hold, 3 repeat contractions  Internal Anal Sphincter   External Anal Sphincter   Puborectalis   Diastasis Recti 2-3 finger width separation  (Blank rows = not tested)        TONE: WNL  PROLAPSE: Grade 1 anterior vaginal wall laxity and uterine ligament laxity   TODAY'S TREATMENT:  DATE:  08/24/24 Neuromuscular re-education: Supine resisted march isometric 10x bil Bridge with hip adduction, transversus abdominus, and pelvic floor muscle 2 x 10 Supine unilateral hip abduction red band 10x bil Exercises: Lower trunk rotation 2 x 10 Therapeutic activities: Splinting with bowel movements Using squatty potty and relaxed toilet mechanics Exhale with pushing   07/20/24 Neuromuscular re-education: Bridge with hip adduction, transversus abdominus, and pelvic floor muscle 2 x 10 Supine thigh press + transversus abdominus + pelvic floor muscle 10x bil Supine leg extensions 10x bil Modified dead bug 10x bil Bird dog 12x Bear plank 10x Seated hip adduction ball press with transversus abdominus and pelvic floor muscle 2 x 10 Seated hip abduction red band with transversus abdominus and pelvic floor muscle 2 x 10 Seated resisted march red band with transversus abdominus and pelvic  floor muscle 2 x 10 Seated unilateral hip abduction + red band 10x bil Exercises: Cat cow 2 x 10 Child's pose 10 breaths    06/15/24 Neuromuscular re-education: Transversus abdominus training with multimodal cues for improved motor control and breath coordination Bil supine UE ball press with transversus abdominus and pelvic floor muscle contractions and breath coordination 10x Supine hip adduction ball press with transversus abdominus and pelvic floor muscle contractions and breath coordination 10x Supine thigh press + transversus abdominus + pelvic floor muscle 10x bil Seated hip adduction ball press with transversus abdominus and pelvic floor muscle 2 x 10 Seated hip abduction green band with transversus abdominus and pelvic floor muscle 2 x 10 Seated resisted march green band with transversus abdominus and pelvic floor muscle 2 x 10 Therapeutic activities: Squat + 3 lb weight hold at 90 degrees of shoulder flexion 2 x 10 to table  Standing shoulder extension green band 2 x 10    PATIENT EDUCATION:  Education details: See above Person educated: Patient Education method: Programmer, Multimedia, Facilities Manager, Actor cues, Verbal cues, and Handouts Education comprehension: verbalized understanding  HOME EXERCISE PROGRAM: [id number]  ASSESSMENT:  CLINICAL IMPRESSION: Patient is a 43 y.o. female who was seen today for physical therapy evaluation and treatment for core weakness, constipation, and stress urinary incontinence. Pt has not been consistent with exercises. She has seen GI doctor and has started taking more fiber. We reviewed toilet mechanics, exhale with pushing, splinting, and self bowel massage. She was able to return to exercises today with good form and breath coordination. She will continue to benefit from skilled PT intervention in order improve constipation, decrease urinary incontinence, decrease dyspareunia, address all impairments, and improve quality of life.   OBJECTIVE  IMPAIRMENTS: decreased activity tolerance, decreased coordination, decreased endurance, decreased mobility, decreased ROM, decreased strength, increased fascial restrictions, increased muscle spasms, impaired flexibility, impaired tone, improper body mechanics, postural dysfunction, and pain.   ACTIVITY LIMITATIONS: lifting and continence  PARTICIPATION LIMITATIONS: interpersonal relationship, community activity, occupation, and exercise  PERSONAL FACTORS: 1 comorbidity: medical history are also affecting patient's functional outcome.   REHAB POTENTIAL: Good  CLINICAL DECISION MAKING: Stable/uncomplicated  EVALUATION COMPLEXITY: Low   GOALS: Goals reviewed with patient? Yes  SHORT TERM GOALS: Target date: 07/20/24    Pt will be independent with HEP in order to improve activity tolerance.   Baseline: Goal status: MET 07/20/24  2.  Pt will be able to correctly perform diaphragmatic breathing and appropriate pressure management in order to prevent worsening vaginal wall/uterine ligament laxity and improve pelvic floor A/ROM.    Baseline: grade 1 anterior vaginal wall and uterine ligament laxity Goal status: IN PROGRESS 08/24/24  3.  Pt will have daily bowel movement with no straining and complete evacuation in order to put less pressure on uterine prolapse and improve pressure management.   Baseline: straining and incomplete Goal status:  IN PROGRESS 08/24/24  4.  Patient will be able to perform single leg stance with good pelvic stability in order to demonstrate appropriate pelvic floor muscle and transversus abdominus strength and coordination in order to decrease urinary incontinence.   Baseline:  Goal status:  IN PROGRESS 08/24/24   LONG TERM GOALS: Target date: 07/20/24  Pt will be independent with advanced HEP in order to improve activity tolerance.   Baseline:  Goal status:  IN PROGRESS 08/24/24  2.  Pt will report no leaks with laughing, coughing, sneezing in order to  improve comfort with interpersonal relationships and community activities.   Baseline: leaking Goal status:  IN PROGRESS 08/24/24  3.  Pt will report 0/10 pain with vaginal penetration in order to improve intimate relationship with partner.    Baseline: pain with certain positions  Goal status:  IN PROGRESS 08/24/24  4.  Pt will demonstrate 3/3 on sit-up test in order to have improved core strength that will allow her to avoid injury in the future.  Baseline: 1/3 Goal status:  IN PROGRESS 08/24/24  5.  Pt will demonstrate no abdominal distortion with curl-up test in order to prevent abnormal forces on pelvis/pelvic floor muscles to decrease risk of worsening prolapse and bowel/bladder dysfunction. Baseline: distortion Goal status:  IN PROGRESS 08/24/24  6.  Pt will demonstrate normal pelvic floor muscle tone and A/ROM, able to achieve 3/5 strength with contractions and 10 sec endurance, in order to reduce urinary leaking and number of pads patient wears.   Baseline: 2/5, 5 second endurance  Goal status:  IN PROGRESS 08/24/24  PLAN:  PT FREQUENCY: 1-2x/week  PT DURATION: 16 visits    PLANNED INTERVENTIONS: [id number]- PT Re-evaluation, 97110-Therapeutic exercises, 97530- Therapeutic activity, 97112- Neuromuscular re-education, 97535- Self Care, [id number]- Manual therapy, [phone]- Gait training, [phone]- Aquatic Therapy, [phone]- Electrical stimulation (unattended), [phone]- Traction (mechanical), [id number]- Ionotophoresis 4mg /ml Dexamethasone , 79439 (1-2 muscles), 20561 (3+ muscles)- Dry Needling, Patient/Family education, Balance training, Taping, Joint mobilization, Joint manipulation, Spinal manipulation, Spinal mobilization, Scar mobilization, Vestibular training, Cryotherapy, Moist heat, and Biofeedback  PLAN FOR NEXT SESSION: core strengthening; pressure management   Josette Mares, PT, DPT11/12/2509:00 AM

## 2024-09-01 ENCOUNTER — Encounter

## 2024-09-02 ENCOUNTER — Encounter

## 2024-09-03 ENCOUNTER — Ambulatory Visit

## 2024-09-03 DIAGNOSIS — M6281 Muscle weakness (generalized): Secondary | ICD-10-CM

## 2024-09-03 DIAGNOSIS — R279 Unspecified lack of coordination: Secondary | ICD-10-CM

## 2024-09-03 NOTE — Therapy (Signed)
 OUTPATIENT PHYSICAL THERAPY FEMALE PELVIC TREATMENT   Patient Name: Charlotte Nelson MRN: [id number] DOB:October 07, 1981, 43 y.o., female Today's Date: 09/03/2024  END OF SESSION:  PT End of Session - 09/03/24 1236     Visit Number 5    Date for Recertification  11/25/24    Authorization Type [employer]    PT Start Time 1234    PT Stop Time 1312    PT Time Calculation (min) 38 min    Activity Tolerance Patient tolerated treatment well    Behavior During Therapy WFL for tasks assessed/performed          Past Medical History:  Diagnosis Date   Alopecia    Anemia    not currently   Anxiety    Depression    Family history of adverse reaction to anesthesia    PATERNAL GRANDFATHER HAD UNKNOWN PROBLEM  MANY YRS AGO - IT WASN'T A SERIOUS PROBLEM   Gastritis    GERD (gastroesophageal reflux disease)    Gestational diabetes    metformin  and lantus insulin   Gestational diabetes mellitus, class A2 07/07/2011   does not normally have diabetes   Headache(784.0)    migraine   Hypertension in pregnancy, preeclampsia, delivered 11/17/2020   Kidney stones    OCD (obsessive compulsive disorder)    Pregnancy induced hypertension    nifedipine    Tremor    familial tremor   Past Surgical History:  Procedure Laterality Date   CYSTOSCOPY W/ URETERAL STENT PLACEMENT Left 09/22/2015   Procedure: CYSTOSCOPY WITH LEFT  RETROGRADE PYELOGRAM/ STENT PLACEMENT;  Surgeon: Arlena Gal, MD;  Location: [facility];  Service: Urology;  Laterality: Left;   CYSTOSCOPY WITH RETROGRADE PYELOGRAM, URETEROSCOPY AND STENT PLACEMENT Bilateral 02/27/2022   Procedure: CYSTOSCOPY WITH RETROGRADE PYELOGRAM, URETEROSCOPY AND STENT PLACEMENT;  Surgeon: Elisabeth Valli BIRCH, MD;  Location: [facility];  Service: Urology;  Laterality: Bilateral;   HOLMIUM LASER APPLICATION Bilateral 02/27/2022   Procedure: HOLMIUM LASER APPLICATION;  Surgeon: Elisabeth Valli BIRCH, MD;  Location: [facility];  Service: Urology;  Laterality: Bilateral;    LITHOTRIPSY     MOUTH SURGERY     wisdom teeth and gum x2   Patient Active Problem List   Diagnosis Date Noted   Chronic constipation 07/21/2024   Family history of thyroid  disease 07/21/2024   Dry eye 07/21/2024   Establishing care with new doctor, encounter for 07/20/2024   Libido, decreased 05/08/2024   Inattention 10/29/2023   Hyponychia left ring finger 07/26/2023   Obesity (BMI 30-39.9) 05/21/2023   Prediabetes 04/02/2023   Thyroid  enlargement 09/20/2021   Superficial venous thrombosis of upper extremity, right 06/13/2021   Family history of DVT 06/13/2021   Family history of pulmonary embolism 06/13/2021   Right wrist pain 11/06/2019   Perennial allergic rhinitis 11/06/2019   Palpitations 09/11/2019   Hair thinning 05/22/2019   Screening for hyperlipidemia 04/27/2019   Cubital tunnel syndrome, left 02/24/2019   DDD (degenerative disc disease), lumbar 02/03/2019   Allergic conjunctivitis with xerophthalmia 01/22/2019   LPRD (laryngopharyngeal reflux disease) 11/25/2017   Migraine headache 07/07/2015   Annual physical exam 05/12/2015   Left nephrolithiasis 05/12/2015   Essential tremor 05/12/2015   Mood disorder 05/12/2015   Microcytosis 05/12/2015    PCP: Curtis Debby PARAS, MD  REFERRING PROVIDER: Curtis Debby PARAS, MD  REFERRING DIAG: R68.82 (ICD-10-CM) - Libido, decreased  THERAPY DIAG:  Muscle weakness (generalized)  Unspecified lack of coordination  Rationale for Evaluation and Treatment: Rehabilitation  ONSET DATE: 10/2019  SUBJECTIVE:  SUBJECTIVE STATEMENT: Pt has been to see GI doctor and been diagnosed with chronic idiopathetic constipation. She has been started on extra fiber and is really working in more water. She is scheduled for colonoscopy at the end of  the year.    PAIN:  Are you having pain? Yes NPRS scale: 7/10 Pain location: pain during intercourse  Pain type: tender Pain description: intermittent   Aggravating factors: certain positions during intercourse  Relieving factors: no vaginal penetration  PRECAUTIONS: None  RED FLAGS: None   WEIGHT BEARING RESTRICTIONS: No  FALLS:  Has patient fallen in last 6 months? No  OCCUPATION: utlrasound technician   ACTIVITY LEVEL : none  PLOF: Independent  PATIENT GOALS: improve intercourse discomfort, decrease urinary incontinence, improve core strength, get active overall   PERTINENT HISTORY:  G4P4, GERD, lumbar DDD Sexual abuse: No  BOWEL MOVEMENT: Pain with bowel movement: No Type of bowel movement:Type (Bristol Stool Scale) 1-2, Frequency 1x/day, and Strain yes Fully empty rectum: No Leakage: No Pads: No Fiber supplement/laxative Yes - Miralax  URINATION: Pain with urination: No Fully empty bladder: Yes:   Stream: Strong Urgency: No Frequency: every 2 hours during the day Fluid Intake: not enough Leakage: Coughing, Sneezing, and Laughing Pads: No  INTERCOURSE:  Ability to have vaginal penetration Yes  Pain with intercourse: Initial Penetration, During Penetration, and Deep Penetration DrynessYes  Climax: less intense and harder to achieve Marinoff Scale: 1/3 Lubricant: yes  PREGNANCY: Vaginal deliveries 4 Tearing Yes:   Episiotomy No C-section deliveries 0 Currently pregnant No  PROLAPSE: Pressure and Bulge   OBJECTIVE:  Note: Objective measures were completed at Evaluation unless otherwise noted.   PATIENT SURVEYS:   PFIQ-7: 72, 33, 38 (combined, bladder, bowel)  COGNITION: Overall cognitive status: Within functional limits for tasks assessed     SENSATION: Light touch: Appears intact   FUNCTIONAL TESTS:  Squat:Rt weight shift  Single leg stance:  Rt: pelvic drop  Lt: stable  Curl-up test: abdominal distortion Sit-up test:  1/3 ACTIVE STRAIGHT LEG RAISE: (+) bil   GAIT: Assistive device utilized: None Comments: WNL  POSTURE: increased lumbar lordosis, anterior pelvic tilt, and Rt posterior rotation   LUMBARAROM/PROM: WNL   PALPATION:   General: WNL  Pelvic Alignment: Rt posterior rotation  Abdominal: apical breathing pattern                External Perineal Exam: WNL                             Internal Pelvic Floor: WNL - palpation of cervix  Patient confirms identification and approves PT to assess internal pelvic floor and treatment Yes  PELVIC MMT:   MMT eval  Vaginal 2/5, 5 second hold, 3 repeat contractions  Internal Anal Sphincter   External Anal Sphincter   Puborectalis   Diastasis Recti 2-3 finger width separation  (Blank rows = not tested)        TONE: WNL  PROLAPSE: Grade 1 anterior vaginal wall laxity and uterine ligament laxity   TODAY'S TREATMENT:  DATE:  09/03/24 Exercises: Cat cow 2 x 10 Therapeutic activities: Pt education on expectations for uterine prolapse - not fixing prolapse, but providing improved muscular support, mitigating symptoms [place] to help reduce pain during intercourse Pt education on possible benefit of estrogen cream and talking with OBGYN about this    08/24/24 Neuromuscular re-education: Supine resisted march isometric 10x bil Bridge with hip adduction, transversus abdominus, and pelvic floor muscle 2 x 10 Supine unilateral hip abduction red band 10x bil Exercises: Lower trunk rotation 2 x 10 Therapeutic activities: Splinting with bowel movements Using squatty potty and relaxed toilet mechanics Exhale with pushing   07/20/24 Neuromuscular re-education: Bridge with hip adduction, transversus abdominus, and pelvic floor muscle 2 x 10 Supine thigh press + transversus abdominus + pelvic floor muscle 10x  bil Supine leg extensions 10x bil Modified dead bug 10x bil Bird dog 12x Bear plank 10x Seated hip adduction ball press with transversus abdominus and pelvic floor muscle 2 x 10 Seated hip abduction red band with transversus abdominus and pelvic floor muscle 2 x 10 Seated resisted march red band with transversus abdominus and pelvic floor muscle 2 x 10 Seated unilateral hip abduction + red band 10x bil Exercises: Cat cow 2 x 10 Child's pose 10 breaths     PATIENT EDUCATION:  Education details: See above Person educated: Patient Education method: Programmer, Multimedia, Demonstration, Tactile cues, Verbal cues, and Handouts Education comprehension: verbalized understanding  HOME EXERCISE PROGRAM: [id number]  ASSESSMENT:  CLINICAL IMPRESSION: Patient is a 43 y.o. female who was seen today for physical therapy evaluation and treatment for core weakness, constipation, and stress urinary incontinence. Pt is doing better with exercise compliance at home. She is still having pain during intercourse when pneis hits cervix. We talked about at length expectations of pelvic floor muscle rehab and that it will likely not have huge impact on grade of uterine prolapse, but will likely have profound impact on symptom reduction and prevention of worsening prolapse. We did not have time to perform active intervention this session due to this discussion. She will continue to benefit from skilled PT intervention in order improve constipation, decrease urinary incontinence, decrease dyspareunia, address all impairments, and improve quality of life.   OBJECTIVE IMPAIRMENTS: decreased activity tolerance, decreased coordination, decreased endurance, decreased mobility, decreased ROM, decreased strength, increased fascial restrictions, increased muscle spasms, impaired flexibility, impaired tone, improper body mechanics, postural dysfunction, and pain.   ACTIVITY LIMITATIONS: lifting and continence  PARTICIPATION  LIMITATIONS: interpersonal relationship, community activity, occupation, and exercise  PERSONAL FACTORS: 1 comorbidity: medical history are also affecting patient's functional outcome.   REHAB POTENTIAL: Good  CLINICAL DECISION MAKING: Stable/uncomplicated  EVALUATION COMPLEXITY: Low   GOALS: Goals reviewed with patient? Yes  SHORT TERM GOALS: Target date: 07/20/24    Pt will be independent with HEP in order to improve activity tolerance.   Baseline: Goal status: MET 07/20/24  2.  Pt will be able to correctly perform diaphragmatic breathing and appropriate pressure management in order to prevent worsening vaginal wall/uterine ligament laxity and improve pelvic floor A/ROM.    Baseline: grade 1 anterior vaginal wall and uterine ligament laxity Goal status: IN PROGRESS 08/24/24  3.  Pt will have daily bowel movement with no straining and complete evacuation in order to put less pressure on uterine prolapse and improve pressure management.   Baseline: straining and incomplete Goal status:  IN PROGRESS 08/24/24  4.  Patient will be able to perform single leg stance with good pelvic stability in  order to demonstrate appropriate pelvic floor muscle and transversus abdominus strength and coordination in order to decrease urinary incontinence.   Baseline:  Goal status:  IN PROGRESS 08/24/24   LONG TERM GOALS: Target date: 07/20/24  Pt will be independent with advanced HEP in order to improve activity tolerance.   Baseline:  Goal status:  IN PROGRESS 08/24/24  2.  Pt will report no leaks with laughing, coughing, sneezing in order to improve comfort with interpersonal relationships and community activities.   Baseline: leaking Goal status:  IN PROGRESS 08/24/24  3.  Pt will report 0/10 pain with vaginal penetration in order to improve intimate relationship with partner.    Baseline: pain with certain positions  Goal status:  IN PROGRESS 08/24/24  4.  Pt will demonstrate 3/3 on  sit-up test in order to have improved core strength that will allow her to avoid injury in the future.  Baseline: 1/3 Goal status:  IN PROGRESS 08/24/24  5.  Pt will demonstrate no abdominal distortion with curl-up test in order to prevent abnormal forces on pelvis/pelvic floor muscles to decrease risk of worsening prolapse and bowel/bladder dysfunction. Baseline: distortion Goal status:  IN PROGRESS 08/24/24  6.  Pt will demonstrate normal pelvic floor muscle tone and A/ROM, able to achieve 3/5 strength with contractions and 10 sec endurance, in order to reduce urinary leaking and number of pads patient wears.   Baseline: 2/5, 5 second endurance  Goal status:  IN PROGRESS 08/24/24  PLAN:  PT FREQUENCY: 1-2x/week  PT DURATION: 16 visits    PLANNED INTERVENTIONS: 97164- PT Re-evaluation, 97110-Therapeutic exercises, 97530- Therapeutic activity, 97112- Neuromuscular re-education, 97535- Self Care, [id number]- Manual therapy, [phone]- Gait training, [phone]- Aquatic Therapy, [phone]- Electrical stimulation (unattended), [phone]- Traction (mechanical), [id number]- Ionotophoresis 4mg /ml Dexamethasone , 20560 (1-2 muscles), 20561 (3+ muscles)- Dry Needling, Patient/Family education, Balance training, Taping, Joint mobilization, Joint manipulation, Spinal manipulation, Spinal mobilization, Scar mobilization, Vestibular training, Cryotherapy, Moist heat, and Biofeedback  PLAN FOR NEXT SESSION: core strengthening; pressure management   Josette Mares, PT, DPT11/13/251:23 PM

## 2024-09-09 ENCOUNTER — Ambulatory Visit

## 2024-09-09 DIAGNOSIS — R279 Unspecified lack of coordination: Secondary | ICD-10-CM

## 2024-09-09 DIAGNOSIS — M6281 Muscle weakness (generalized): Secondary | ICD-10-CM

## 2024-09-09 NOTE — Therapy (Signed)
 OUTPATIENT PHYSICAL THERAPY FEMALE PELVIC TREATMENT   Patient Name: Charlotte Nelson MRN: [id number] DOB:1981-08-11, 43 y.o., female Today's Date: 09/09/2024  END OF SESSION:  PT End of Session - 09/09/24 0936     Visit Number 6    Date for Recertification  11/25/24    Authorization Type [employer]    PT Start Time 0930    PT Stop Time 1010    PT Time Calculation (min) 40 min    Activity Tolerance Patient tolerated treatment well    Behavior During Therapy WFL for tasks assessed/performed          Past Medical History:  Diagnosis Date   Alopecia    Anemia    not currently   Anxiety    Depression    Family history of adverse reaction to anesthesia    PATERNAL GRANDFATHER HAD UNKNOWN PROBLEM  MANY YRS AGO - IT WASN'T A SERIOUS PROBLEM   Gastritis    GERD (gastroesophageal reflux disease)    Gestational diabetes    metformin  and lantus insulin   Gestational diabetes mellitus, class A2 07/07/2011   does not normally have diabetes   Headache(784.0)    migraine   Hypertension in pregnancy, preeclampsia, delivered 11/17/2020   Kidney stones    OCD (obsessive compulsive disorder)    Pregnancy induced hypertension    nifedipine    Tremor    familial tremor   Past Surgical History:  Procedure Laterality Date   CYSTOSCOPY W/ URETERAL STENT PLACEMENT Left 09/22/2015   Procedure: CYSTOSCOPY WITH LEFT  RETROGRADE PYELOGRAM/ STENT PLACEMENT;  Surgeon: Arlena Gal, MD;  Location: [facility];  Service: Urology;  Laterality: Left;   CYSTOSCOPY WITH RETROGRADE PYELOGRAM, URETEROSCOPY AND STENT PLACEMENT Bilateral 02/27/2022   Procedure: CYSTOSCOPY WITH RETROGRADE PYELOGRAM, URETEROSCOPY AND STENT PLACEMENT;  Surgeon: Elisabeth Valli BIRCH, MD;  Location: [facility];  Service: Urology;  Laterality: Bilateral;   HOLMIUM LASER APPLICATION Bilateral 02/27/2022   Procedure: HOLMIUM LASER APPLICATION;  Surgeon: Elisabeth Valli BIRCH, MD;  Location: [facility];  Service: Urology;  Laterality: Bilateral;    LITHOTRIPSY     MOUTH SURGERY     wisdom teeth and gum x2   Patient Active Problem List   Diagnosis Date Noted   Chronic constipation 07/21/2024   Family history of thyroid  disease 07/21/2024   Dry eye 07/21/2024   Establishing care with new doctor, encounter for 07/20/2024   Libido, decreased 05/08/2024   Inattention 10/29/2023   Hyponychia left ring finger 07/26/2023   Obesity (BMI 30-39.9) 05/21/2023   Prediabetes 04/02/2023   Thyroid  enlargement 09/20/2021   Superficial venous thrombosis of upper extremity, right 06/13/2021   Family history of DVT 06/13/2021   Family history of pulmonary embolism 06/13/2021   Right wrist pain 11/06/2019   Perennial allergic rhinitis 11/06/2019   Palpitations 09/11/2019   Hair thinning 05/22/2019   Screening for hyperlipidemia 04/27/2019   Cubital tunnel syndrome, left 02/24/2019   DDD (degenerative disc disease), lumbar 02/03/2019   Allergic conjunctivitis with xerophthalmia 01/22/2019   LPRD (laryngopharyngeal reflux disease) 11/25/2017   Migraine headache 07/07/2015   Annual physical exam 05/12/2015   Left nephrolithiasis 05/12/2015   Essential tremor 05/12/2015   Mood disorder 05/12/2015   Microcytosis 05/12/2015    PCP: Curtis Debby PARAS, MD  REFERRING PROVIDER: Curtis Debby PARAS, MD  REFERRING DIAG: R68.82 (ICD-10-CM) - Libido, decreased  THERAPY DIAG:  Muscle weakness (generalized)  Unspecified lack of coordination  Rationale for Evaluation and Treatment: Rehabilitation  ONSET DATE: 10/2019  SUBJECTIVE:  SUBJECTIVE STATEMENT: Pt feeling better this week about prolapse symptoms.    PAIN:  Are you having pain? Yes NPRS scale: 7/10 Pain location: pain during intercourse  Pain type: tender Pain description: intermittent    Aggravating factors: certain positions during intercourse  Relieving factors: no vaginal penetration  PRECAUTIONS: None  RED FLAGS: None   WEIGHT BEARING RESTRICTIONS: No  FALLS:  Has patient fallen in last 6 months? No  OCCUPATION: utlrasound technician   ACTIVITY LEVEL : none  PLOF: Independent  PATIENT GOALS: improve intercourse discomfort, decrease urinary incontinence, improve core strength, get active overall   PERTINENT HISTORY:  G4P4, GERD, lumbar DDD Sexual abuse: No  BOWEL MOVEMENT: Pain with bowel movement: No Type of bowel movement:Type (Bristol Stool Scale) 1-2, Frequency 1x/day, and Strain yes Fully empty rectum: No Leakage: No Pads: No Fiber supplement/laxative Yes - Miralax  URINATION: Pain with urination: No Fully empty bladder: Yes:   Stream: Strong Urgency: No Frequency: every 2 hours during the day Fluid Intake: not enough Leakage: Coughing, Sneezing, and Laughing Pads: No  INTERCOURSE:  Ability to have vaginal penetration Yes  Pain with intercourse: Initial Penetration, During Penetration, and Deep Penetration DrynessYes  Climax: less intense and harder to achieve Marinoff Scale: 1/3 Lubricant: yes  PREGNANCY: Vaginal deliveries 4 Tearing Yes:   Episiotomy No C-section deliveries 0 Currently pregnant No  PROLAPSE: Pressure and Bulge   OBJECTIVE:  Note: Objective measures were completed at Evaluation unless otherwise noted.   PATIENT SURVEYS:   PFIQ-7: 72, 33, 38 (combined, bladder, bowel)  COGNITION: Overall cognitive status: Within functional limits for tasks assessed     SENSATION: Light touch: Appears intact   FUNCTIONAL TESTS:  Squat:Rt weight shift  Single leg stance:  Rt: pelvic drop  Lt: stable  Curl-up test: abdominal distortion Sit-up test: 1/3 ACTIVE STRAIGHT LEG RAISE: (+) bil   GAIT: Assistive device utilized: None Comments: WNL  POSTURE: increased lumbar lordosis, anterior pelvic tilt, and  Rt posterior rotation   LUMBARAROM/PROM: WNL   PALPATION:   General: WNL  Pelvic Alignment: Rt posterior rotation  Abdominal: apical breathing pattern                External Perineal Exam: WNL                             Internal Pelvic Floor: WNL - palpation of cervix  Patient confirms identification and approves PT to assess internal pelvic floor and treatment Yes  PELVIC MMT:   MMT eval  Vaginal 2/5, 5 second hold, 3 repeat contractions  Internal Anal Sphincter   External Anal Sphincter   Puborectalis   Diastasis Recti 2-3 finger width separation  (Blank rows = not tested)        TONE: WNL  PROLAPSE: Grade 1 anterior vaginal wall laxity and uterine ligament laxity   TODAY'S TREATMENT:  DATE:  09/09/24 Neuromuscular re-education: Bridge with hip adduction, transversus abdominus, and pelvic floor muscle 2 x 10 Supine resisted unilateral hip abduction + red band 10x bil Supine resisted march + red band 2 x 10 Modified dead bug 10x bil Bird dog 10x   09/03/24 Exercises: Cat cow 2 x 10 Therapeutic activities: Pt education on expectations for uterine prolapse - not fixing prolapse, but providing improved muscular support, mitigating symptoms Ohnut to help reduce pain during intercourse Pt education on possible benefit of estrogen cream and talking with OBGYN about this    08/24/24 Neuromuscular re-education: Supine resisted march isometric 10x bil Bridge with hip adduction, transversus abdominus, and pelvic floor muscle 2 x 10 Supine unilateral hip abduction red band 10x bil Exercises: Lower trunk rotation 2 x 10 Therapeutic activities: Splinting with bowel movements Using squatty potty and relaxed toilet mechanics Exhale with pushing    PATIENT EDUCATION:  Education details: See above Person educated: Patient Education  method: Programmer, Multimedia, Demonstration, Tactile cues, Verbal cues, and Handouts Education comprehension: verbalized understanding  HOME EXERCISE PROGRAM: [id number]  ASSESSMENT:  CLINICAL IMPRESSION: Patient is a 43 y.o. female who was seen today for physical therapy evaluation and treatment for core weakness, constipation, and stress urinary incontinence. Pt feeling better this week about prolapse and feels like she wants to try some of the pessary options that are temporary, like poise impressa. Pt did very well with all core activities this session. She will continue to benefit from skilled PT intervention in order improve constipation, decrease urinary incontinence, decrease dyspareunia, address all impairments, and improve quality of life.   OBJECTIVE IMPAIRMENTS: decreased activity tolerance, decreased coordination, decreased endurance, decreased mobility, decreased ROM, decreased strength, increased fascial restrictions, increased muscle spasms, impaired flexibility, impaired tone, improper body mechanics, postural dysfunction, and pain.   ACTIVITY LIMITATIONS: lifting and continence  PARTICIPATION LIMITATIONS: interpersonal relationship, community activity, occupation, and exercise  PERSONAL FACTORS: 1 comorbidity: medical history are also affecting patient's functional outcome.   REHAB POTENTIAL: Good  CLINICAL DECISION MAKING: Stable/uncomplicated  EVALUATION COMPLEXITY: Low   GOALS: Goals reviewed with patient? Yes  SHORT TERM GOALS: Target date: 07/20/24    Pt will be independent with HEP in order to improve activity tolerance.   Baseline: Goal status: MET 07/20/24  2.  Pt will be able to correctly perform diaphragmatic breathing and appropriate pressure management in order to prevent worsening vaginal wall/uterine ligament laxity and improve pelvic floor A/ROM.    Baseline: grade 1 anterior vaginal wall and uterine ligament laxity Goal status: IN PROGRESS 08/24/24  3.   Pt will have daily bowel movement with no straining and complete evacuation in order to put less pressure on uterine prolapse and improve pressure management.   Baseline: straining and incomplete Goal status:  IN PROGRESS 08/24/24  4.  Patient will be able to perform single leg stance with good pelvic stability in order to demonstrate appropriate pelvic floor muscle and transversus abdominus strength and coordination in order to decrease urinary incontinence.   Baseline:  Goal status:  IN PROGRESS 08/24/24   LONG TERM GOALS: Target date: 07/20/24  Pt will be independent with advanced HEP in order to improve activity tolerance.   Baseline:  Goal status:  IN PROGRESS 08/24/24  2.  Pt will report no leaks with laughing, coughing, sneezing in order to improve comfort with interpersonal relationships and community activities.   Baseline: leaking Goal status:  IN PROGRESS 08/24/24  3.  Pt will report 0/10 pain with vaginal penetration  in order to improve intimate relationship with partner.    Baseline: pain with certain positions  Goal status:  IN PROGRESS 08/24/24  4.  Pt will demonstrate 3/3 on sit-up test in order to have improved core strength that will allow her to avoid injury in the future.  Baseline: 1/3 Goal status:  IN PROGRESS 08/24/24  5.  Pt will demonstrate no abdominal distortion with curl-up test in order to prevent abnormal forces on pelvis/pelvic floor muscles to decrease risk of worsening prolapse and bowel/bladder dysfunction. Baseline: distortion Goal status:  IN PROGRESS 08/24/24  6.  Pt will demonstrate normal pelvic floor muscle tone and A/ROM, able to achieve 3/5 strength with contractions and 10 sec endurance, in order to reduce urinary leaking and number of pads patient wears.   Baseline: 2/5, 5 second endurance  Goal status:  IN PROGRESS 08/24/24  PLAN:  PT FREQUENCY: 1-2x/week  PT DURATION: 16 visits    PLANNED INTERVENTIONS: 97164- PT Re-evaluation,  97110-Therapeutic exercises, 97530- Therapeutic activity, 97112- Neuromuscular re-education, 97535- Self Care, 02859- Manual therapy, [phone]- Gait training, [phone]- Aquatic Therapy, [phone]- Electrical stimulation (unattended), [phone]- Traction (mechanical), [id number]- Ionotophoresis 4mg /ml Dexamethasone , 79439 (1-2 muscles), 20561 (3+ muscles)- Dry Needling, Patient/Family education, Balance training, Taping, Joint mobilization, Joint manipulation, Spinal manipulation, Spinal mobilization, Scar mobilization, Vestibular training, Cryotherapy, Moist heat, and Biofeedback  PLAN FOR NEXT SESSION: core strengthening; pressure management   Josette Mares, PT, DPT11/19/2510:11 AM

## 2024-09-14 ENCOUNTER — Ambulatory Visit

## 2024-09-14 DIAGNOSIS — M6281 Muscle weakness (generalized): Secondary | ICD-10-CM

## 2024-09-14 DIAGNOSIS — R279 Unspecified lack of coordination: Secondary | ICD-10-CM

## 2024-09-14 NOTE — Therapy (Signed)
 OUTPATIENT PHYSICAL THERAPY FEMALE PELVIC TREATMENT   Patient Name: Joane Postel MRN: [id number] DOB:[birth year], 43 y.o., female Today's Date: 09/14/2024  END OF SESSION:  PT End of Session - 09/14/24 0804     Visit Number 7    Date for Recertification  11/25/24    Authorization Type [employer]    PT Start Time 0801    PT Stop Time 0842    PT Time Calculation (min) 41 min    Activity Tolerance Patient tolerated treatment well    Behavior During Therapy WFL for tasks assessed/performed          Past Medical History:  Diagnosis Date   Alopecia    Anemia    not currently   Anxiety    Depression    Family history of adverse reaction to anesthesia    PATERNAL GRANDFATHER HAD UNKNOWN PROBLEM  MANY YRS AGO - IT WASN'T A SERIOUS PROBLEM   Gastritis    GERD (gastroesophageal reflux disease)    Gestational diabetes    metformin  and lantus insulin   Gestational diabetes mellitus, class A2 07/07/2011   does not normally have diabetes   Headache(784.0)    migraine   Hypertension in pregnancy, preeclampsia, delivered 11/17/2020   Kidney stones    OCD (obsessive compulsive disorder)    Pregnancy induced hypertension    nifedipine    Tremor    familial tremor   Past Surgical History:  Procedure Laterality Date   CYSTOSCOPY W/ URETERAL STENT PLACEMENT Left 09/22/2015   Procedure: CYSTOSCOPY WITH LEFT  RETROGRADE PYELOGRAM/ STENT PLACEMENT;  Surgeon: Arlena Gal, MD;  Location: [facility];  Service: Urology;  Laterality: Left;   CYSTOSCOPY WITH RETROGRADE PYELOGRAM, URETEROSCOPY AND STENT PLACEMENT Bilateral 02/27/2022   Procedure: CYSTOSCOPY WITH RETROGRADE PYELOGRAM, URETEROSCOPY AND STENT PLACEMENT;  Surgeon: Elisabeth Valli BIRCH, MD;  Location: [facility];  Service: Urology;  Laterality: Bilateral;   HOLMIUM LASER APPLICATION Bilateral 02/27/2022   Procedure: HOLMIUM LASER APPLICATION;  Surgeon: Elisabeth Valli BIRCH, MD;  Location: [facility];  Service: Urology;  Laterality: Bilateral;    LITHOTRIPSY     MOUTH SURGERY     wisdom teeth and gum x2   Patient Active Problem List   Diagnosis Date Noted   Chronic constipation 07/21/2024   Family history of thyroid  disease 07/21/2024   Dry eye 07/21/2024   Establishing care with new doctor, encounter for 07/20/2024   Libido, decreased 05/08/2024   Inattention 10/29/2023   Hyponychia left ring finger 07/26/2023   Obesity (BMI 30-39.9) 05/21/2023   Prediabetes 04/02/2023   Thyroid  enlargement 09/20/2021   Superficial venous thrombosis of upper extremity, right 06/13/2021   Family history of DVT 06/13/2021   Family history of pulmonary embolism 06/13/2021   Right wrist pain 11/06/2019   Perennial allergic rhinitis 11/06/2019   Palpitations 09/11/2019   Hair thinning 05/22/2019   Screening for hyperlipidemia 04/27/2019   Cubital tunnel syndrome, left 02/24/2019   DDD (degenerative disc disease), lumbar 02/03/2019   Allergic conjunctivitis with xerophthalmia 01/22/2019   LPRD (laryngopharyngeal reflux disease) 11/25/2017   Migraine headache 07/07/2015   Annual physical exam 05/12/2015   Left nephrolithiasis 05/12/2015   Essential tremor 05/12/2015   Mood disorder 05/12/2015   Microcytosis 05/12/2015    PCP: Curtis Debby PARAS, MD  REFERRING PROVIDER: Curtis Debby PARAS, MD  REFERRING DIAG: R68.82 (ICD-10-CM) - Libido, decreased  THERAPY DIAG:  Muscle weakness (generalized)  Unspecified lack of coordination  Rationale for Evaluation and Treatment: Rehabilitation  ONSET DATE: 10/2019  SUBJECTIVE:  SUBJECTIVE STATEMENT: Pt reports no changes.    PAIN:  Are you having pain? Yes NPRS scale: 7/10 Pain location: pain during intercourse  Pain type: tender Pain description: intermittent   Aggravating factors: certain  positions during intercourse  Relieving factors: no vaginal penetration  PRECAUTIONS: None  RED FLAGS: None   WEIGHT BEARING RESTRICTIONS: No  FALLS:  Has patient fallen in last 6 months? No  OCCUPATION: utlrasound technician   ACTIVITY LEVEL : none  PLOF: Independent  PATIENT GOALS: improve intercourse discomfort, decrease urinary incontinence, improve core strength, get active overall   PERTINENT HISTORY:  G4P4, GERD, lumbar DDD Sexual abuse: No  BOWEL MOVEMENT: Pain with bowel movement: No Type of bowel movement:Type (Bristol Stool Scale) 1-2, Frequency 1x/day, and Strain yes Fully empty rectum: No Leakage: No Pads: No Fiber supplement/laxative Yes - Miralax  URINATION: Pain with urination: No Fully empty bladder: Yes:   Stream: Strong Urgency: No Frequency: every 2 hours during the day Fluid Intake: not enough Leakage: Coughing, Sneezing, and Laughing Pads: No  INTERCOURSE:  Ability to have vaginal penetration Yes  Pain with intercourse: Initial Penetration, During Penetration, and Deep Penetration DrynessYes  Climax: less intense and harder to achieve Marinoff Scale: 1/3 Lubricant: yes  PREGNANCY: Vaginal deliveries 4 Tearing Yes:   Episiotomy No C-section deliveries 0 Currently pregnant No  PROLAPSE: Pressure and Bulge   OBJECTIVE:  Note: Objective measures were completed at Evaluation unless otherwise noted.   PATIENT SURVEYS:   PFIQ-7: 72, 33, 38 (combined, bladder, bowel)  COGNITION: Overall cognitive status: Within functional limits for tasks assessed     SENSATION: Light touch: Appears intact   FUNCTIONAL TESTS:  Squat:Rt weight shift  Single leg stance:  Rt: pelvic drop  Lt: stable  Curl-up test: abdominal distortion Sit-up test: 1/3 ACTIVE STRAIGHT LEG RAISE: (+) bil   GAIT: Assistive device utilized: None Comments: WNL  POSTURE: increased lumbar lordosis, anterior pelvic tilt, and Rt posterior  rotation   LUMBARAROM/PROM: WNL   PALPATION:   General: WNL  Pelvic Alignment: Rt posterior rotation  Abdominal: apical breathing pattern                External Perineal Exam: WNL                             Internal Pelvic Floor: WNL - palpation of cervix  Patient confirms identification and approves PT to assess internal pelvic floor and treatment Yes  PELVIC MMT:   MMT eval  Vaginal 2/5, 5 second hold, 3 repeat contractions  Internal Anal Sphincter   External Anal Sphincter   Puborectalis   Diastasis Recti 2-3 finger width separation  (Blank rows = not tested)        TONE: WNL  PROLAPSE: Grade 1 anterior vaginal wall laxity and uterine ligament laxity   TODAY'S TREATMENT:  DATE:  09/14/24 Neuromuscular re-education: Supine leg extensions 10x bil Bridge march 10x bil Bridge + clam 10x Quadruped fire hydrants 10x bil Quadruped rainbows 10x bil Quadruped hip hikes 10x bil   09/09/24 Neuromuscular re-education: Bridge with hip adduction, transversus abdominus, and pelvic floor muscle 2 x 10 Supine resisted unilateral hip abduction + red band 10x bil Supine resisted march + red band 2 x 10 Modified dead bug 10x bil Bird dog 10x   09/03/24 Exercises: Cat cow 2 x 10 Therapeutic activities: Pt education on expectations for uterine prolapse - not fixing prolapse, but providing improved muscular support, mitigating symptoms Ohnut to help reduce pain during intercourse Pt education on possible benefit of estrogen cream and talking with OBGYN about this     PATIENT EDUCATION:  Education details: See above Person educated: Patient Education method: Explanation, Demonstration, Tactile cues, Verbal cues, and Handouts Education comprehension: verbalized understanding  HOME EXERCISE PROGRAM: [id number]  ASSESSMENT:  CLINICAL  IMPRESSION: Patient is a 43 y.o. female who was seen today for physical therapy evaluation and treatment for core weakness, constipation, and stress urinary incontinence. Pt doing well and was able to focus on core strengthening progressions in prolapse friendly positions. She did very well with breath coordination, but did require multimodal cues to help miantain stability throughout pelvis. She will continue to benefit from skilled PT intervention in order improve constipation, decrease urinary incontinence, decrease dyspareunia, address all impairments, and improve quality of life.   OBJECTIVE IMPAIRMENTS: decreased activity tolerance, decreased coordination, decreased endurance, decreased mobility, decreased ROM, decreased strength, increased fascial restrictions, increased muscle spasms, impaired flexibility, impaired tone, improper body mechanics, postural dysfunction, and pain.   ACTIVITY LIMITATIONS: lifting and continence  PARTICIPATION LIMITATIONS: interpersonal relationship, community activity, occupation, and exercise  PERSONAL FACTORS: 1 comorbidity: medical history are also affecting patient's functional outcome.   REHAB POTENTIAL: Good  CLINICAL DECISION MAKING: Stable/uncomplicated  EVALUATION COMPLEXITY: Low   GOALS: Goals reviewed with patient? Yes  SHORT TERM GOALS: Target date: 07/20/24    Pt will be independent with HEP in order to improve activity tolerance.   Baseline: Goal status: MET 07/20/24  2.  Pt will be able to correctly perform diaphragmatic breathing and appropriate pressure management in order to prevent worsening vaginal wall/uterine ligament laxity and improve pelvic floor A/ROM.    Baseline: grade 1 anterior vaginal wall and uterine ligament laxity Goal status: IN PROGRESS 08/24/24  3.  Pt will have daily bowel movement with no straining and complete evacuation in order to put less pressure on uterine prolapse and improve pressure management.    Baseline: straining and incomplete Goal status:  IN PROGRESS 08/24/24  4.  Patient will be able to perform single leg stance with good pelvic stability in order to demonstrate appropriate pelvic floor muscle and transversus abdominus strength and coordination in order to decrease urinary incontinence.   Baseline:  Goal status:  IN PROGRESS 08/24/24   LONG TERM GOALS: Target date: 07/20/24  Pt will be independent with advanced HEP in order to improve activity tolerance.   Baseline:  Goal status:  IN PROGRESS 08/24/24  2.  Pt will report no leaks with laughing, coughing, sneezing in order to improve comfort with interpersonal relationships and community activities.   Baseline: leaking Goal status:  IN PROGRESS 08/24/24  3.  Pt will report 0/10 pain with vaginal penetration in order to improve intimate relationship with partner.    Baseline: pain with certain positions  Goal status:  IN PROGRESS 08/24/24  4.  Pt will demonstrate 3/3 on sit-up test in order to have improved core strength that will allow her to avoid injury in the future.  Baseline: 1/3 Goal status:  IN PROGRESS 08/24/24  5.  Pt will demonstrate no abdominal distortion with curl-up test in order to prevent abnormal forces on pelvis/pelvic floor muscles to decrease risk of worsening prolapse and bowel/bladder dysfunction. Baseline: distortion Goal status:  IN PROGRESS 08/24/24  6.  Pt will demonstrate normal pelvic floor muscle tone and A/ROM, able to achieve 3/5 strength with contractions and 10 sec endurance, in order to reduce urinary leaking and number of pads patient wears.   Baseline: 2/5, 5 second endurance  Goal status:  IN PROGRESS 08/24/24  PLAN:  PT FREQUENCY: 1-2x/week  PT DURATION: 16 visits    PLANNED INTERVENTIONS: 97164- PT Re-evaluation, 97110-Therapeutic exercises, 97530- Therapeutic activity, 97112- Neuromuscular re-education, 97535- Self Care, 02859- Manual therapy, [phone]- Gait training, [phone]-  Aquatic Therapy, [phone]- Electrical stimulation (unattended), [phone]- Traction (mechanical), [id number]- Ionotophoresis 4mg /ml Dexamethasone , 79439 (1-2 muscles), 20561 (3+ muscles)- Dry Needling, Patient/Family education, Balance training, Taping, Joint mobilization, Joint manipulation, Spinal manipulation, Spinal mobilization, Scar mobilization, Vestibular training, Cryotherapy, Moist heat, and Biofeedback  PLAN FOR NEXT SESSION: core strengthening; pressure management   Josette Mares, PT, DPT11/24/258:40 AM

## 2024-09-23 ENCOUNTER — Ambulatory Visit: Attending: Sports Medicine

## 2024-09-23 DIAGNOSIS — M6281 Muscle weakness (generalized): Secondary | ICD-10-CM | POA: Insufficient documentation

## 2024-09-23 DIAGNOSIS — R279 Unspecified lack of coordination: Secondary | ICD-10-CM | POA: Diagnosis present

## 2024-09-23 NOTE — Therapy (Signed)
 OUTPATIENT PHYSICAL THERAPY FEMALE PELVIC TREATMENT   Patient Name: Charlotte Nelson MRN: [id number] DOB:1981/10/17, 43 y.o., female Today's Date: 09/23/2024  END OF SESSION:  PT End of Session - 09/23/24 0847     Visit Number 8    Date for Recertification  11/25/24    Authorization Type [employer]    PT Start Time 0845    PT Stop Time 0925    PT Time Calculation (min) 40 min    Activity Tolerance Patient tolerated treatment well    Behavior During Therapy WFL for tasks assessed/performed          Past Medical History:  Diagnosis Date   Alopecia    Anemia    not currently   Anxiety    Depression    Family history of adverse reaction to anesthesia    PATERNAL GRANDFATHER HAD UNKNOWN PROBLEM  MANY YRS AGO - IT WASN'T A SERIOUS PROBLEM   Gastritis    GERD (gastroesophageal reflux disease)    Gestational diabetes    metformin  and lantus insulin   Gestational diabetes mellitus, class A2 07/07/2011   does not normally have diabetes   Headache(784.0)    migraine   Hypertension in pregnancy, preeclampsia, delivered 11/17/2020   Kidney stones    OCD (obsessive compulsive disorder)    Pregnancy induced hypertension    nifedipine    Tremor    familial tremor   Past Surgical History:  Procedure Laterality Date   CYSTOSCOPY W/ URETERAL STENT PLACEMENT Left 09/22/2015   Procedure: CYSTOSCOPY WITH LEFT  RETROGRADE PYELOGRAM/ STENT PLACEMENT;  Surgeon: Arlena Gal, MD;  Location: [facility];  Service: Urology;  Laterality: Left;   CYSTOSCOPY WITH RETROGRADE PYELOGRAM, URETEROSCOPY AND STENT PLACEMENT Bilateral 02/27/2022   Procedure: CYSTOSCOPY WITH RETROGRADE PYELOGRAM, URETEROSCOPY AND STENT PLACEMENT;  Surgeon: Elisabeth Valli BIRCH, MD;  Location: [facility];  Service: Urology;  Laterality: Bilateral;   HOLMIUM LASER APPLICATION Bilateral 02/27/2022   Procedure: HOLMIUM LASER APPLICATION;  Surgeon: Elisabeth Valli BIRCH, MD;  Location: [facility];  Service: Urology;  Laterality: Bilateral;    LITHOTRIPSY     MOUTH SURGERY     wisdom teeth and gum x2   Patient Active Problem List   Diagnosis Date Noted   Chronic constipation 07/21/2024   Family history of thyroid  disease 07/21/2024   Dry eye 07/21/2024   Establishing care with new doctor, encounter for 07/20/2024   Libido, decreased 05/08/2024   Inattention 10/29/2023   Hyponychia left ring finger 07/26/2023   Obesity (BMI 30-39.9) 05/21/2023   Prediabetes 04/02/2023   Thyroid  enlargement 09/20/2021   Superficial venous thrombosis of upper extremity, right 06/13/2021   Family history of DVT 06/13/2021   Family history of pulmonary embolism 06/13/2021   Right wrist pain 11/06/2019   Perennial allergic rhinitis 11/06/2019   Palpitations 09/11/2019   Hair thinning 05/22/2019   Screening for hyperlipidemia 04/27/2019   Cubital tunnel syndrome, left 02/24/2019   DDD (degenerative disc disease), lumbar 02/03/2019   Allergic conjunctivitis with xerophthalmia 01/22/2019   LPRD (laryngopharyngeal reflux disease) 11/25/2017   Migraine headache 07/07/2015   Annual physical exam 05/12/2015   Left nephrolithiasis 05/12/2015   Essential tremor 05/12/2015   Mood disorder 05/12/2015   Microcytosis 05/12/2015    PCP: Curtis Debby PARAS, MD  REFERRING PROVIDER: Curtis Debby PARAS, MD  REFERRING DIAG: R68.82 (ICD-10-CM) - Libido, decreased  THERAPY DIAG:  Muscle weakness (generalized)  Unspecified lack of coordination  Rationale for Evaluation and Treatment: Rehabilitation  ONSET DATE: 10/2019  SUBJECTIVE:  SUBJECTIVE STATEMENT: Pt has started treatment for her neck. She was encouraged to talk to PFPT about hip pain and cramping in her hips when she has an orgasm. Pt states that she has not been noticing pelvic pressure as much at  the end of the day. Pt states that pain in intercourse is improving and is more like discomfort.    PAIN: 09/23/24 Are you having pain? Yes NPRS scale: 5-6/10 Pain location: pain during intercourse  Pain type: tender Pain description: intermittent   Aggravating factors: certain positions during intercourse  Relieving factors: no vaginal penetration  PRECAUTIONS: None  RED FLAGS: None   WEIGHT BEARING RESTRICTIONS: No  FALLS:  Has patient fallen in last 6 months? No  OCCUPATION: utlrasound technician   ACTIVITY LEVEL : none  PLOF: Independent  PATIENT GOALS: improve intercourse discomfort, decrease urinary incontinence, improve core strength, get active overall   PERTINENT HISTORY:  G4P4, GERD, lumbar DDD Sexual abuse: No  BOWEL MOVEMENT: Pain with bowel movement: No Type of bowel movement:Type (Bristol Stool Scale) 1-2, Frequency 1x/day, and Strain yes Fully empty rectum: No Leakage: No Pads: No Fiber supplement/laxative Yes - Miralax  URINATION: Pain with urination: No Fully empty bladder: Yes:   Stream: Strong Urgency: No Frequency: every 2 hours during the day Fluid Intake: not enough Leakage: Coughing, Sneezing, and Laughing Pads: No  INTERCOURSE:  Ability to have vaginal penetration Yes  Pain with intercourse: Initial Penetration, During Penetration, and Deep Penetration DrynessYes  Climax: less intense and harder to achieve Marinoff Scale: 1/3 Lubricant: yes  PREGNANCY: Vaginal deliveries 4 Tearing Yes:   Episiotomy No C-section deliveries 0 Currently pregnant No  PROLAPSE: Pressure and Bulge   OBJECTIVE:  Note: Objective measures were completed at Evaluation unless otherwise noted.   PATIENT SURVEYS:   PFIQ-7: 72, 33, 38 (combined, bladder, bowel)  COGNITION: Overall cognitive status: Within functional limits for tasks assessed     SENSATION: Light touch: Appears intact   FUNCTIONAL TESTS:  Squat:Rt weight shift  Single  leg stance:  Rt: pelvic drop  Lt: stable  Curl-up test: abdominal distortion Sit-up test: 1/3 ACTIVE STRAIGHT LEG RAISE: (+) bil   GAIT: Assistive device utilized: None Comments: WNL  POSTURE: increased lumbar lordosis, anterior pelvic tilt, and Rt posterior rotation   LUMBARAROM/PROM: WNL   PALPATION:   General: WNL  Pelvic Alignment: Rt posterior rotation  Abdominal: apical breathing pattern                External Perineal Exam: WNL                             Internal Pelvic Floor: WNL - palpation of cervix  Patient confirms identification and approves PT to assess internal pelvic floor and treatment Yes  PELVIC MMT:   MMT eval  Vaginal 2/5, 5 second hold, 3 repeat contractions  Internal Anal Sphincter   External Anal Sphincter   Puborectalis   Diastasis Recti 2-3 finger width separation  (Blank rows = not tested)        TONE: WNL  PROLAPSE: Grade 1 anterior vaginal wall laxity and uterine ligament laxity   TODAY'S TREATMENT:  DATE:  09/23/24 Manual: Prone L4-5 Grade 2-3 mobilizations  Neuromuscular re-education: Bridge with hip adduction, transversus abdominus, and pelvic floor muscle 2 x 10 Bridge + hip abduction + red band 2 x 10 Bridge + march 10x Bridge + heel raises 2 x 10 Supine full shoulder flexion + 5 lbs 2 x 10 Bridge + unilateral chest press 5 lbs 10x bil   09/14/24 Neuromuscular re-education: Supine leg extensions 10x bil Bridge march 10x bil Bridge + clam 10x Quadruped fire hydrants 10x bil Quadruped rainbows 10x bil Quadruped hip hikes 10x bil   09/09/24 Neuromuscular re-education: Bridge with hip adduction, transversus abdominus, and pelvic floor muscle 2 x 10 Supine resisted unilateral hip abduction + red band 10x bil Supine resisted march + red band 2 x 10 Modified dead bug 10x bil Bird dog 10x     PATIENT EDUCATION:  Education details: See above Person educated: Patient Education method: Programmer, Multimedia, Demonstration, Actor cues, Verbal cues, and Handouts Education comprehension: verbalized understanding  HOME EXERCISE PROGRAM: [id number]  ASSESSMENT:  CLINICAL IMPRESSION: Patient is a 43 y.o. female who was seen today for physical therapy evaluation and treatment for core weakness, constipation, and stress urinary incontinence. Pt doing better with feeling less vaginal pressure and having less pain during intercourse. However, she reports that when having an orgasm on her back she will have spasm/cramping in hre hips. Believe that this could be positional and due to pelvic floor muscle/hip weakness. She does have some tenderness at L4-5 that could be contributing to spasm here with orgasm; mobilizations performed to these levels with good tolerance and improved mobility. We focused strengthening on supine and bridge positions to use gravity to our benefit for prolapse symptoms. She will continue to benefit from skilled PT intervention in order improve constipation, decrease urinary incontinence, decrease dyspareunia, address all impairments, and improve quality of life.   OBJECTIVE IMPAIRMENTS: decreased activity tolerance, decreased coordination, decreased endurance, decreased mobility, decreased ROM, decreased strength, increased fascial restrictions, increased muscle spasms, impaired flexibility, impaired tone, improper body mechanics, postural dysfunction, and pain.   ACTIVITY LIMITATIONS: lifting and continence  PARTICIPATION LIMITATIONS: interpersonal relationship, community activity, occupation, and exercise  PERSONAL FACTORS: 1 comorbidity: medical history are also affecting patient's functional outcome.   REHAB POTENTIAL: Good  CLINICAL DECISION MAKING: Stable/uncomplicated  EVALUATION COMPLEXITY: Low   GOALS: Goals reviewed with patient? Yes  SHORT TERM GOALS:  Target date: 07/20/24    Pt will be independent with HEP in order to improve activity tolerance.   Baseline: Goal status: MET 07/20/24  2.  Pt will be able to correctly perform diaphragmatic breathing and appropriate pressure management in order to prevent worsening vaginal wall/uterine ligament laxity and improve pelvic floor A/ROM.    Baseline: grade 1 anterior vaginal wall and uterine ligament laxity Goal status: IN PROGRESS 08/24/24  3.  Pt will have daily bowel movement with no straining and complete evacuation in order to put less pressure on uterine prolapse and improve pressure management.   Baseline: straining and incomplete Goal status:  IN PROGRESS 08/24/24  4.  Patient will be able to perform single leg stance with good pelvic stability in order to demonstrate appropriate pelvic floor muscle and transversus abdominus strength and coordination in order to decrease urinary incontinence.   Baseline:  Goal status:  IN PROGRESS 08/24/24   LONG TERM GOALS: Target date: 07/20/24  Pt will be independent with advanced HEP in order to improve activity tolerance.   Baseline:  Goal status:  IN PROGRESS  08/24/24  2.  Pt will report no leaks with laughing, coughing, sneezing in order to improve comfort with interpersonal relationships and community activities.   Baseline: leaking Goal status:  IN PROGRESS 08/24/24  3.  Pt will report 0/10 pain with vaginal penetration in order to improve intimate relationship with partner.    Baseline: pain with certain positions  Goal status:  IN PROGRESS 08/24/24  4.  Pt will demonstrate 3/3 on sit-up test in order to have improved core strength that will allow her to avoid injury in the future.  Baseline: 1/3 Goal status:  IN PROGRESS 08/24/24  5.  Pt will demonstrate no abdominal distortion with curl-up test in order to prevent abnormal forces on pelvis/pelvic floor muscles to decrease risk of worsening prolapse and bowel/bladder  dysfunction. Baseline: distortion Goal status:  IN PROGRESS 08/24/24  6.  Pt will demonstrate normal pelvic floor muscle tone and A/ROM, able to achieve 3/5 strength with contractions and 10 sec endurance, in order to reduce urinary leaking and number of pads patient wears.   Baseline: 2/5, 5 second endurance  Goal status:  IN PROGRESS 08/24/24  PLAN:  PT FREQUENCY: 1-2x/week  PT DURATION: 16 visits    PLANNED INTERVENTIONS: [id number]- PT Re-evaluation, 97110-Therapeutic exercises, 97530- Therapeutic activity, 97112- Neuromuscular re-education, 97535- Self Care, [id number]- Manual therapy, [phone]- Gait training, [phone]- Aquatic Therapy, [phone]- Electrical stimulation (unattended), [phone]- Traction (mechanical), [id number]- Ionotophoresis 4mg /ml Dexamethasone , 79439 (1-2 muscles), 20561 (3+ muscles)- Dry Needling, Patient/Family education, Balance training, Taping, Joint mobilization, Joint manipulation, Spinal manipulation, Spinal mobilization, Scar mobilization, Vestibular training, Cryotherapy, Moist heat, and Biofeedback  PLAN FOR NEXT SESSION: core strengthening; pressure management   Josette Mares, PT, DPT12/03/258:47 AM

## 2024-09-29 ENCOUNTER — Ambulatory Visit

## 2024-09-29 DIAGNOSIS — M6281 Muscle weakness (generalized): Secondary | ICD-10-CM

## 2024-09-29 DIAGNOSIS — R279 Unspecified lack of coordination: Secondary | ICD-10-CM

## 2024-09-29 NOTE — Therapy (Signed)
 OUTPATIENT PHYSICAL THERAPY FEMALE PELVIC TREATMENT   Patient Name: Charlotte Nelson MRN: [id number] DOB:18-Jun-1981, 43 y.o., female Today's Date: 09/29/2024  END OF SESSION:  PT End of Session - 09/29/24 0851     Visit Number 9    Date for Recertification  11/25/24    Authorization Type [employer]    PT Start Time 0847    PT Stop Time 0926    PT Time Calculation (min) 39 min    Activity Tolerance Patient tolerated treatment well    Behavior During Therapy WFL for tasks assessed/performed          Past Medical History:  Diagnosis Date   Alopecia    Anemia    not currently   Anxiety    Depression    Family history of adverse reaction to anesthesia    PATERNAL GRANDFATHER HAD UNKNOWN PROBLEM  MANY YRS AGO - IT WASN'T A SERIOUS PROBLEM   Gastritis    GERD (gastroesophageal reflux disease)    Gestational diabetes    metformin  and lantus insulin   Gestational diabetes mellitus, class A2 07/07/2011   does not normally have diabetes   Headache(784.0)    migraine   Hypertension in pregnancy, preeclampsia, delivered 11/17/2020   Kidney stones    OCD (obsessive compulsive disorder)    Pregnancy induced hypertension    nifedipine    Tremor    familial tremor   Past Surgical History:  Procedure Laterality Date   CYSTOSCOPY W/ URETERAL STENT PLACEMENT Left 09/22/2015   Procedure: CYSTOSCOPY WITH LEFT  RETROGRADE PYELOGRAM/ STENT PLACEMENT;  Surgeon: Arlena Gal, MD;  Location: [facility];  Service: Urology;  Laterality: Left;   CYSTOSCOPY WITH RETROGRADE PYELOGRAM, URETEROSCOPY AND STENT PLACEMENT Bilateral 02/27/2022   Procedure: CYSTOSCOPY WITH RETROGRADE PYELOGRAM, URETEROSCOPY AND STENT PLACEMENT;  Surgeon: Elisabeth Valli BIRCH, MD;  Location: [facility];  Service: Urology;  Laterality: Bilateral;   HOLMIUM LASER APPLICATION Bilateral 02/27/2022   Procedure: HOLMIUM LASER APPLICATION;  Surgeon: Elisabeth Valli BIRCH, MD;  Location: [facility];  Service: Urology;  Laterality: Bilateral;    LITHOTRIPSY     MOUTH SURGERY     wisdom teeth and gum x2   Patient Active Problem List   Diagnosis Date Noted   Chronic constipation 07/21/2024   Family history of thyroid  disease 07/21/2024   Dry eye 07/21/2024   Establishing care with new doctor, encounter for 07/20/2024   Libido, decreased 05/08/2024   Inattention 10/29/2023   Hyponychia left ring finger 07/26/2023   Obesity (BMI 30-39.9) 05/21/2023   Prediabetes 04/02/2023   Thyroid  enlargement 09/20/2021   Superficial venous thrombosis of upper extremity, right 06/13/2021   Family history of DVT 06/13/2021   Family history of pulmonary embolism 06/13/2021   Right wrist pain 11/06/2019   Perennial allergic rhinitis 11/06/2019   Palpitations 09/11/2019   Hair thinning 05/22/2019   Screening for hyperlipidemia 04/27/2019   Cubital tunnel syndrome, left 02/24/2019   DDD (degenerative disc disease), lumbar 02/03/2019   Allergic conjunctivitis with xerophthalmia 01/22/2019   LPRD (laryngopharyngeal reflux disease) 11/25/2017   Migraine headache 07/07/2015   Annual physical exam 05/12/2015   Left nephrolithiasis 05/12/2015   Essential tremor 05/12/2015   Mood disorder 05/12/2015   Microcytosis 05/12/2015    PCP: Curtis Debby PARAS, MD  REFERRING PROVIDER: Curtis Debby PARAS, MD  REFERRING DIAG: R68.82 (ICD-10-CM) - Libido, decreased  THERAPY DIAG:  Muscle weakness (generalized)  Unspecified lack of coordination  Rationale for Evaluation and Treatment: Rehabilitation  ONSET DATE: 10/2019  SUBJECTIVE:  SUBJECTIVE STATEMENT: Pt states that she is feeling a lot of vaginal pressure compared to what it had been feeling like, but she also just ovulated. She did not have any pain during intercourse. No pain with orgasm this week.     PAIN: 09/28/24 Are you having pain? Yes NPRS scale: 0/10 Pain location: pain during intercourse  Pain type: tender Pain description: intermittent   Aggravating factors: certain positions during intercourse  Relieving factors: no vaginal penetration  PRECAUTIONS: None  RED FLAGS: None   WEIGHT BEARING RESTRICTIONS: No  FALLS:  Has patient fallen in last 6 months? No  OCCUPATION: utlrasound technician   ACTIVITY LEVEL : none  PLOF: Independent  PATIENT GOALS: improve intercourse discomfort, decrease urinary incontinence, improve core strength, get active overall   PERTINENT HISTORY:  G4P4, GERD, lumbar DDD Sexual abuse: No  BOWEL MOVEMENT: Pain with bowel movement: No Type of bowel movement:Type (Bristol Stool Scale) 1-2, Frequency 1x/day, and Strain yes Fully empty rectum: No Leakage: No Pads: No Fiber supplement/laxative Yes - Miralax  URINATION: Pain with urination: No Fully empty bladder: Yes:   Stream: Strong Urgency: No Frequency: every 2 hours during the day Fluid Intake: not enough Leakage: Coughing, Sneezing, and Laughing Pads: No  INTERCOURSE:  Ability to have vaginal penetration Yes  Pain with intercourse: Initial Penetration, During Penetration, and Deep Penetration DrynessYes  Climax: less intense and harder to achieve Marinoff Scale: 1/3 Lubricant: yes  PREGNANCY: Vaginal deliveries 4 Tearing Yes:   Episiotomy No C-section deliveries 0 Currently pregnant No  PROLAPSE: Pressure and Bulge   OBJECTIVE:  Note: Objective measures were completed at Evaluation unless otherwise noted.   PATIENT SURVEYS:   PFIQ-7: 72, 33, 38 (combined, bladder, bowel)  COGNITION: Overall cognitive status: Within functional limits for tasks assessed     SENSATION: Light touch: Appears intact   FUNCTIONAL TESTS:  Squat:Rt weight shift  Single leg stance:  Rt: pelvic drop  Lt: stable  Curl-up test: abdominal distortion Sit-up test:  1/3 ACTIVE STRAIGHT LEG RAISE: (+) bil   GAIT: Assistive device utilized: None Comments: WNL  POSTURE: increased lumbar lordosis, anterior pelvic tilt, and Rt posterior rotation   LUMBARAROM/PROM: WNL   PALPATION:   General: WNL  Pelvic Alignment: Rt posterior rotation  Abdominal: apical breathing pattern                External Perineal Exam: WNL                             Internal Pelvic Floor: WNL - palpation of cervix  Patient confirms identification and approves PT to assess internal pelvic floor and treatment Yes  PELVIC MMT:   MMT eval  Vaginal 2/5, 5 second hold, 3 repeat contractions  Internal Anal Sphincter   External Anal Sphincter   Puborectalis   Diastasis Recti 2-3 finger width separation  (Blank rows = not tested)        TONE: WNL  PROLAPSE: Grade 1 anterior vaginal wall laxity and uterine ligament laxity   TODAY'S TREATMENT:  DATE:  09/28/24 Manual: Prone L4-5 Grade 2-3 mobilizations  Neuromuscular re-education: Bridge with hip adduction, transversus abdominus, and pelvic floor muscle 2 x 10 Bridge + hip abduction + red band 2 x 10 Bridge + march 10x Bridge + heel raises 2 x 10 Supine full shoulder flexion + 7 lbs 2 x 10 Bridge + unilateral chest press 7 lbs 10x bil Supine diagonal resisted knee flexion 10x bil Supine reverse table top heel taps 2 x 10  09/23/24 Manual: Prone L4-5 Grade 2-3 mobilizations  Neuromuscular re-education: Bridge with hip adduction, transversus abdominus, and pelvic floor muscle 2 x 10 Bridge + hip abduction + red band 2 x 10 Bridge + march 10x Bridge + heel raises 2 x 10 Supine full shoulder flexion + 5 lbs 2 x 10 Bridge + unilateral chest press 5 lbs 10x bil   09/14/24 Neuromuscular re-education: Supine leg extensions 10x bil Bridge march 10x bil Bridge + clam 10x Quadruped  fire hydrants 10x bil Quadruped rainbows 10x bil Quadruped hip hikes 10x bil    PATIENT EDUCATION:  Education details: See above Person educated: Patient Education method: Programmer, Multimedia, Facilities Manager, Actor cues, Verbal cues, and Handouts Education comprehension: verbalized understanding  HOME EXERCISE PROGRAM: [id number]  ASSESSMENT:  CLINICAL IMPRESSION: Patient is a 43 y.o. female who was seen today for physical therapy  treatment for core weakness, constipation, and stress urinary incontinence. Pt doing very, having no pain during intercourse or with orgasm. She is feeling like uterus is sitting lower this week, but she is also ovulating. She did very well with previous exercise progressions and addition of progressively challenging activities. She require verbal cues still to breathe and keep neutral spine. She will continue to benefit from skilled PT intervention in order improve constipation, decrease urinary incontinence, decrease dyspareunia, address all impairments, and improve quality of life.   OBJECTIVE IMPAIRMENTS: decreased activity tolerance, decreased coordination, decreased endurance, decreased mobility, decreased ROM, decreased strength, increased fascial restrictions, increased muscle spasms, impaired flexibility, impaired tone, improper body mechanics, postural dysfunction, and pain.   ACTIVITY LIMITATIONS: lifting and continence  PARTICIPATION LIMITATIONS: interpersonal relationship, community activity, occupation, and exercise  PERSONAL FACTORS: 1 comorbidity: medical history are also affecting patient's functional outcome.   REHAB POTENTIAL: Good  CLINICAL DECISION MAKING: Stable/uncomplicated  EVALUATION COMPLEXITY: Low   GOALS: Goals reviewed with patient? Yes  SHORT TERM GOALS: Target date: 07/20/24    Pt will be independent with HEP in order to improve activity tolerance.   Baseline: Goal status: MET 07/20/24  2.  Pt will be able to correctly  perform diaphragmatic breathing and appropriate pressure management in order to prevent worsening vaginal wall/uterine ligament laxity and improve pelvic floor A/ROM.    Baseline: grade 1 anterior vaginal wall and uterine ligament laxity Goal status: IN PROGRESS 09/28/24  3.  Pt will have daily bowel movement with no straining and complete evacuation in order to put less pressure on uterine prolapse and improve pressure management.   Baseline: straining and incomplete Goal status:  IN PROGRESS 09/28/24  4.  Patient will be able to perform single leg stance with good pelvic stability in order to demonstrate appropriate pelvic floor muscle and transversus abdominus strength and coordination in order to decrease urinary incontinence.   Baseline:  Goal status:  IN PROGRESS 09/28/24   LONG TERM GOALS: Target date: 07/20/24  Pt will be independent with advanced HEP in order to improve activity tolerance.   Baseline:  Goal status:  IN PROGRESS 09/28/24  2.  Pt will report no leaks with laughing, coughing, sneezing in order to improve comfort with interpersonal relationships and community activities.   Baseline: leaking Goal status:  IN PROGRESS 09/28/24  3.  Pt will report 0/10 pain with vaginal penetration in order to improve intimate relationship with partner.    Baseline: pain with certain positions  Goal status:  IN PROGRESS 09/28/24  4.  Pt will demonstrate 3/3 on sit-up test in order to have improved core strength that will allow her to avoid injury in the future.  Baseline: 1/3 Goal status:  IN PROGRESS 09/28/24  5.  Pt will demonstrate no abdominal distortion with curl-up test in order to prevent abnormal forces on pelvis/pelvic floor muscles to decrease risk of worsening prolapse and bowel/bladder dysfunction. Baseline: distortion Goal status:  IN PROGRESS 09/28/24  6.  Pt will demonstrate normal pelvic floor muscle tone and A/ROM, able to achieve 3/5 strength with contractions and  10 sec endurance, in order to reduce urinary leaking and number of pads patient wears.   Baseline: 2/5, 5 second endurance  Goal status:  IN PROGRESS 09/28/24  PLAN:  PT FREQUENCY: 1-2x/week  PT DURATION: 16 visits    PLANNED INTERVENTIONS: [id number]- PT Re-evaluation, 97110-Therapeutic exercises, 97530- Therapeutic activity, 97112- Neuromuscular re-education, 97535- Self Care, [id number]- Manual therapy, [phone]- Gait training, [phone]- Aquatic Therapy, [phone]- Electrical stimulation (unattended), [phone]- Traction (mechanical), [id number]- Ionotophoresis 4mg /ml Dexamethasone , 79439 (1-2 muscles), 20561 (3+ muscles)- Dry Needling, Patient/Family education, Balance training, Taping, Joint mobilization, Joint manipulation, Spinal manipulation, Spinal mobilization, Scar mobilization, Vestibular training, Cryotherapy, Moist heat, and Biofeedback  PLAN FOR NEXT SESSION: core strengthening; pressure management   Josette Mares, PT, DPT12/09/258:51 AM

## 2024-09-30 ENCOUNTER — Encounter

## 2024-10-05 LAB — HM COLONOSCOPY

## 2024-10-07 ENCOUNTER — Ambulatory Visit

## 2024-10-07 DIAGNOSIS — M6281 Muscle weakness (generalized): Secondary | ICD-10-CM

## 2024-10-07 DIAGNOSIS — R279 Unspecified lack of coordination: Secondary | ICD-10-CM

## 2024-10-07 NOTE — Therapy (Signed)
 OUTPATIENT PHYSICAL THERAPY FEMALE PELVIC TREATMENT   Patient Name: Charlotte Nelson MRN: [id number] DOB:[birth year], 43 y.o., female Today's Date: 10/07/2024  END OF SESSION:  PT End of Session - 10/07/24 0851     Visit Number 10    Date for Recertification  11/25/24    Authorization Type [employer]    PT Start Time 0848    PT Stop Time 0926    PT Time Calculation (min) 38 min    Activity Tolerance Patient tolerated treatment well    Behavior During Therapy WFL for tasks assessed/performed          Past Medical History:  Diagnosis Date   Alopecia    Anemia    not currently   Anxiety    Depression    Family history of adverse reaction to anesthesia    PATERNAL GRANDFATHER HAD UNKNOWN PROBLEM  MANY YRS AGO - IT WASN'T A SERIOUS PROBLEM   Gastritis    GERD (gastroesophageal reflux disease)    Gestational diabetes    metformin  and lantus insulin   Gestational diabetes mellitus, class A2 07/07/2011   does not normally have diabetes   Headache(784.0)    migraine   Hypertension in pregnancy, preeclampsia, delivered 11/17/2020   Kidney stones    OCD (obsessive compulsive disorder)    Pregnancy induced hypertension    nifedipine    Tremor    familial tremor   Past Surgical History:  Procedure Laterality Date   CYSTOSCOPY W/ URETERAL STENT PLACEMENT Left 09/22/2015   Procedure: CYSTOSCOPY WITH LEFT  RETROGRADE PYELOGRAM/ STENT PLACEMENT;  Surgeon: Arlena Gal, MD;  Location: [facility];  Service: Urology;  Laterality: Left;   CYSTOSCOPY WITH RETROGRADE PYELOGRAM, URETEROSCOPY AND STENT PLACEMENT Bilateral 02/27/2022   Procedure: CYSTOSCOPY WITH RETROGRADE PYELOGRAM, URETEROSCOPY AND STENT PLACEMENT;  Surgeon: Elisabeth Valli BIRCH, MD;  Location: [facility];  Service: Urology;  Laterality: Bilateral;   HOLMIUM LASER APPLICATION Bilateral 02/27/2022   Procedure: HOLMIUM LASER APPLICATION;  Surgeon: Elisabeth Valli BIRCH, MD;  Location: [facility];  Service: Urology;  Laterality: Bilateral;    LITHOTRIPSY     MOUTH SURGERY     wisdom teeth and gum x2   Patient Active Problem List   Diagnosis Date Noted   Chronic constipation 07/21/2024   Family history of thyroid  disease 07/21/2024   Dry eye 07/21/2024   Establishing care with new doctor, encounter for 07/20/2024   Libido, decreased 05/08/2024   Inattention 10/29/2023   Hyponychia left ring finger 07/26/2023   Obesity (BMI 30-39.9) 05/21/2023   Prediabetes 04/02/2023   Thyroid  enlargement 09/20/2021   Superficial venous thrombosis of upper extremity, right 06/13/2021   Family history of DVT 06/13/2021   Family history of pulmonary embolism 06/13/2021   Right wrist pain 11/06/2019   Perennial allergic rhinitis 11/06/2019   Palpitations 09/11/2019   Hair thinning 05/22/2019   Screening for hyperlipidemia 04/27/2019   Cubital tunnel syndrome, left 02/24/2019   DDD (degenerative disc disease), lumbar 02/03/2019   Allergic conjunctivitis with xerophthalmia 01/22/2019   LPRD (laryngopharyngeal reflux disease) 11/25/2017   Migraine headache 07/07/2015   Annual physical exam 05/12/2015   Left nephrolithiasis 05/12/2015   Essential tremor 05/12/2015   Mood disorder 05/12/2015   Microcytosis 05/12/2015    PCP: Curtis Debby PARAS, MD  REFERRING PROVIDER: Curtis Debby PARAS, MD  REFERRING DIAG: R68.82 (ICD-10-CM) - Libido, decreased  THERAPY DIAG:  Muscle weakness (generalized)  Unspecified lack of coordination  Rationale for Evaluation and Treatment: Rehabilitation  ONSET DATE: 10/2019  SUBJECTIVE:  SUBJECTIVE STATEMENT: Pt states that she had colonoscopy Monday and there were no polyps or cancer. She has not had any pain the last two times she has had intercourse.    PAIN: 09/28/24 Are you having pain? Yes NPRS scale:  0/10 Pain location: pain during intercourse  Pain type: tender Pain description: intermittent   Aggravating factors: certain positions during intercourse  Relieving factors: no vaginal penetration  PRECAUTIONS: None  RED FLAGS: None   WEIGHT BEARING RESTRICTIONS: No  FALLS:  Has patient fallen in last 6 months? No  OCCUPATION: utlrasound technician   ACTIVITY LEVEL : none  PLOF: Independent  PATIENT GOALS: improve intercourse discomfort, decrease urinary incontinence, improve core strength, get active overall   PERTINENT HISTORY:  G4P4, GERD, lumbar DDD Sexual abuse: No  BOWEL MOVEMENT: Pain with bowel movement: No Type of bowel movement:Type (Bristol Stool Scale) 1-2, Frequency 1x/day, and Strain yes Fully empty rectum: No Leakage: No Pads: No Fiber supplement/laxative Yes - Miralax  URINATION: Pain with urination: No Fully empty bladder: Yes:   Stream: Strong Urgency: No Frequency: every 2 hours during the day Fluid Intake: not enough Leakage: Coughing, Sneezing, and Laughing Pads: No  INTERCOURSE:  Ability to have vaginal penetration Yes  Pain with intercourse: Initial Penetration, During Penetration, and Deep Penetration DrynessYes  Climax: less intense and harder to achieve Marinoff Scale: 1/3 Lubricant: yes  PREGNANCY: Vaginal deliveries 4 Tearing Yes:   Episiotomy No C-section deliveries 0 Currently pregnant No  PROLAPSE: Pressure and Bulge   OBJECTIVE:  Note: Objective measures were completed at Evaluation unless otherwise noted.   PATIENT SURVEYS:   PFIQ-7: 72, 33, 38 (combined, bladder, bowel)  COGNITION: Overall cognitive status: Within functional limits for tasks assessed     SENSATION: Light touch: Appears intact   FUNCTIONAL TESTS:  Squat:Rt weight shift  Single leg stance:  Rt: pelvic drop  Lt: stable  Curl-up test: abdominal distortion Sit-up test: 1/3 ACTIVE STRAIGHT LEG RAISE: (+) bil   GAIT: Assistive  device utilized: None Comments: WNL  POSTURE: increased lumbar lordosis, anterior pelvic tilt, and Rt posterior rotation   LUMBARAROM/PROM: WNL   PALPATION:   General: WNL  Pelvic Alignment: Rt posterior rotation  Abdominal: apical breathing pattern                External Perineal Exam: WNL                             Internal Pelvic Floor: WNL - palpation of cervix  Patient confirms identification and approves PT to assess internal pelvic floor and treatment Yes  PELVIC MMT:   MMT eval  Vaginal 2/5, 5 second hold, 3 repeat contractions  Internal Anal Sphincter   External Anal Sphincter   Puborectalis   Diastasis Recti 2-3 finger width separation  (Blank rows = not tested)        TONE: WNL  PROLAPSE: Grade 1 anterior vaginal wall laxity and uterine ligament laxity   TODAY'S TREATMENT:  DATE:  10/07/24 Manual: Prone L4-5 Grade 2-3 mobilizations  Neuromuscular re-education: Seated diagonal ball press 10x bil Seated hip adduction ball press with transversus abdominus and pelvic floor muscle 2 x 10 Seated hip adduction + bil shoulder abduction/extension + green band 2 x 10 Standing unilateral shoulder extension + green band 2 x 10 Standing bil shoulder extension + alternating march + green band  Therapeutic activities: Pt education on bowel movement consistency and how to know when to titrate  Squats to table + diaphragmatic breathing + pelvic floor muscle contraction 2 x 10   09/28/24 Manual: Prone L4-5 Grade 2-3 mobilizations  Neuromuscular re-education: Bridge with hip adduction, transversus abdominus, and pelvic floor muscle 2 x 10 Bridge + hip abduction + red band 2 x 10 Bridge + march 10x Bridge + heel raises 2 x 10 Supine full shoulder flexion + 7 lbs 2 x 10 Bridge + unilateral chest press 7 lbs 10x bil Supine diagonal resisted  knee flexion 10x bil Supine reverse table top heel taps 2 x 10  09/23/24 Manual: Prone L4-5 Grade 2-3 mobilizations  Neuromuscular re-education: Bridge with hip adduction, transversus abdominus, and pelvic floor muscle 2 x 10 Bridge + hip abduction + red band 2 x 10 Bridge + march 10x Bridge + heel raises 2 x 10 Supine full shoulder flexion + 5 lbs 2 x 10 Bridge + unilateral chest press 5 lbs 10x bil     PATIENT EDUCATION:  Education details: See above Person educated: Patient Education method: Programmer, Multimedia, Facilities Manager, Actor cues, Verbal cues, and Handouts Education comprehension: verbalized understanding  HOME EXERCISE PROGRAM: [id number]  ASSESSMENT:  CLINICAL IMPRESSION: Patient is a 43 y.o. female who was seen today for physical therapy  treatment for core weakness, constipation, and stress urinary incontinence. Pt doing very well with no pain during last several intercourse attempts. She did very well progressing into more seated and standing activities with good breath coordination and pelvic floor muscle activation. She will continue to benefit from skilled PT intervention in order improve constipation, decrease urinary incontinence, decrease dyspareunia, address all impairments, and improve quality of life.   OBJECTIVE IMPAIRMENTS: decreased activity tolerance, decreased coordination, decreased endurance, decreased mobility, decreased ROM, decreased strength, increased fascial restrictions, increased muscle spasms, impaired flexibility, impaired tone, improper body mechanics, postural dysfunction, and pain.   ACTIVITY LIMITATIONS: lifting and continence  PARTICIPATION LIMITATIONS: interpersonal relationship, community activity, occupation, and exercise  PERSONAL FACTORS: 1 comorbidity: medical history are also affecting patient's functional outcome.   REHAB POTENTIAL: Good  CLINICAL DECISION MAKING: Stable/uncomplicated  EVALUATION COMPLEXITY:  Low   GOALS: Goals reviewed with patient? Yes  SHORT TERM GOALS: Target date: 07/20/24    Pt will be independent with HEP in order to improve activity tolerance.   Baseline: Goal status: MET 07/20/24  2.  Pt will be able to correctly perform diaphragmatic breathing and appropriate pressure management in order to prevent worsening vaginal wall/uterine ligament laxity and improve pelvic floor A/ROM.    Baseline: grade 1 anterior vaginal wall and uterine ligament laxity Goal status: IN PROGRESS 09/28/24  3.  Pt will have daily bowel movement with no straining and complete evacuation in order to put less pressure on uterine prolapse and improve pressure management.   Baseline: straining and incomplete Goal status:  IN PROGRESS 09/28/24  4.  Patient will be able to perform single leg stance with good pelvic stability in order to demonstrate appropriate pelvic floor muscle and transversus abdominus strength and coordination in order to decrease  urinary incontinence.   Baseline:  Goal status:  IN PROGRESS 09/28/24   LONG TERM GOALS: Target date: 07/20/24  Pt will be independent with advanced HEP in order to improve activity tolerance.   Baseline:  Goal status:  IN PROGRESS 09/28/24  2.  Pt will report no leaks with laughing, coughing, sneezing in order to improve comfort with interpersonal relationships and community activities.   Baseline: leaking Goal status:  IN PROGRESS 09/28/24  3.  Pt will report 0/10 pain with vaginal penetration in order to improve intimate relationship with partner.    Baseline: pain with certain positions  Goal status:  IN PROGRESS 09/28/24  4.  Pt will demonstrate 3/3 on sit-up test in order to have improved core strength that will allow her to avoid injury in the future.  Baseline: 1/3 Goal status:  IN PROGRESS 09/28/24  5.  Pt will demonstrate no abdominal distortion with curl-up test in order to prevent abnormal forces on pelvis/pelvic floor muscles  to decrease risk of worsening prolapse and bowel/bladder dysfunction. Baseline: distortion Goal status:  IN PROGRESS 09/28/24  6.  Pt will demonstrate normal pelvic floor muscle tone and A/ROM, able to achieve 3/5 strength with contractions and 10 sec endurance, in order to reduce urinary leaking and number of pads patient wears.   Baseline: 2/5, 5 second endurance  Goal status:  IN PROGRESS 09/28/24  PLAN:  PT FREQUENCY: 1-2x/week  PT DURATION: 16 visits    PLANNED INTERVENTIONS: [id number]- PT Re-evaluation, 97110-Therapeutic exercises, 97530- Therapeutic activity, 97112- Neuromuscular re-education, 97535- Self Care, 02859- Manual therapy, [phone]- Gait training, [phone]- Aquatic Therapy, [phone]- Electrical stimulation (unattended), [phone]- Traction (mechanical), [id number]- Ionotophoresis 4mg /ml Dexamethasone , 79439 (1-2 muscles), [id number] (3+ muscles)- Dry Needling, Patient/Family education, Balance training, Taping, Joint mobilization, Joint manipulation, Spinal manipulation, Spinal mobilization, Scar mobilization, Vestibular training, Cryotherapy, Moist heat, and Biofeedback  PLAN FOR NEXT SESSION: core strengthening; pressure management   Josette Mares, PT, DPT12/17/20259:31 AM

## 2024-10-20 ENCOUNTER — Ambulatory Visit

## 2024-10-20 ENCOUNTER — Encounter: Payer: Self-pay | Admitting: Physician Assistant

## 2024-10-20 DIAGNOSIS — R279 Unspecified lack of coordination: Secondary | ICD-10-CM

## 2024-10-20 DIAGNOSIS — M6281 Muscle weakness (generalized): Secondary | ICD-10-CM

## 2024-10-20 NOTE — Therapy (Signed)
 " OUTPATIENT PHYSICAL THERAPY FEMALE PELVIC TREATMENT   Patient Name: Charlotte Nelson MRN: [id number] DOB:1981-01-18, 43 y.o., female Today's Date: 10/20/2024  END OF SESSION:  PT End of Session - 10/20/24 0805     Visit Number 11    Date for Recertification  11/25/24    Authorization Type [employer]    PT Start Time 0802    PT Stop Time 0840    PT Time Calculation (min) 38 min    Activity Tolerance Patient tolerated treatment well    Behavior During Therapy WFL for tasks assessed/performed          Past Medical History:  Diagnosis Date   Alopecia    Anemia    not currently   Anxiety    Depression    Family history of adverse reaction to anesthesia    PATERNAL GRANDFATHER HAD UNKNOWN PROBLEM  MANY YRS AGO - IT WASN'T A SERIOUS PROBLEM   Gastritis    GERD (gastroesophageal reflux disease)    Gestational diabetes    metformin  and lantus insulin   Gestational diabetes mellitus, class A2 07/07/2011   does not normally have diabetes   Headache(784.0)    migraine   Hypertension in pregnancy, preeclampsia, delivered 11/17/2020   Kidney stones    OCD (obsessive compulsive disorder)    Pregnancy induced hypertension    nifedipine    Tremor    familial tremor   Past Surgical History:  Procedure Laterality Date   CYSTOSCOPY W/ URETERAL STENT PLACEMENT Left 09/22/2015   Procedure: CYSTOSCOPY WITH LEFT  RETROGRADE PYELOGRAM/ STENT PLACEMENT;  Surgeon: Arlena Gal, MD;  Location: [facility];  Service: Urology;  Laterality: Left;   CYSTOSCOPY WITH RETROGRADE PYELOGRAM, URETEROSCOPY AND STENT PLACEMENT Bilateral 02/27/2022   Procedure: CYSTOSCOPY WITH RETROGRADE PYELOGRAM, URETEROSCOPY AND STENT PLACEMENT;  Surgeon: Elisabeth Valli BIRCH, MD;  Location: [facility];  Service: Urology;  Laterality: Bilateral;   HOLMIUM LASER APPLICATION Bilateral 02/27/2022   Procedure: HOLMIUM LASER APPLICATION;  Surgeon: Elisabeth Valli BIRCH, MD;  Location: [facility];  Service: Urology;  Laterality: Bilateral;    LITHOTRIPSY     MOUTH SURGERY     wisdom teeth and gum x2   Patient Active Problem List   Diagnosis Date Noted   Chronic constipation 07/21/2024   Family history of thyroid  disease 07/21/2024   Dry eye 07/21/2024   Establishing care with new doctor, encounter for 07/20/2024   Libido, decreased 05/08/2024   Inattention 10/29/2023   Hyponychia left ring finger 07/26/2023   Obesity (BMI 30-39.9) 05/21/2023   Prediabetes 04/02/2023   Thyroid  enlargement 09/20/2021   Superficial venous thrombosis of upper extremity, right 06/13/2021   Family history of DVT 06/13/2021   Family history of pulmonary embolism 06/13/2021   Right wrist pain 11/06/2019   Perennial allergic rhinitis 11/06/2019   Palpitations 09/11/2019   Hair thinning 05/22/2019   Screening for hyperlipidemia 04/27/2019   Cubital tunnel syndrome, left 02/24/2019   DDD (degenerative disc disease), lumbar 02/03/2019   Allergic conjunctivitis with xerophthalmia 01/22/2019   LPRD (laryngopharyngeal reflux disease) 11/25/2017   Migraine headache 07/07/2015   Annual physical exam 05/12/2015   Left nephrolithiasis 05/12/2015   Essential tremor 05/12/2015   Mood disorder 05/12/2015   Microcytosis 05/12/2015    PCP: Curtis Debby PARAS, MD  REFERRING PROVIDER: Curtis Debby PARAS, MD  REFERRING DIAG: R68.82 (ICD-10-CM) - Libido, decreased  THERAPY DIAG:  Muscle weakness (generalized)  Unspecified lack of coordination  Rationale for Evaluation and Treatment: Rehabilitation  ONSET DATE: 10/2019  SUBJECTIVE:  SUBJECTIVE STATEMENT: Pt states that she had colonoscopy Monday and there were no polyps or cancer. She has not had any pain the last two times she has had intercourse.    PAIN: 09/28/24 Are you having pain? Yes NPRS scale:  0/10 Pain location: pain during intercourse  Pain type: tender Pain description: intermittent   Aggravating factors: certain positions during intercourse  Relieving factors: no vaginal penetration  PRECAUTIONS: None  RED FLAGS: None   WEIGHT BEARING RESTRICTIONS: No  FALLS:  Has patient fallen in last 6 months? No  OCCUPATION: utlrasound technician   ACTIVITY LEVEL : none  PLOF: Independent  PATIENT GOALS: improve intercourse discomfort, decrease urinary incontinence, improve core strength, get active overall   PERTINENT HISTORY:  G4P4, GERD, lumbar DDD Sexual abuse: No  BOWEL MOVEMENT: Pain with bowel movement: No Type of bowel movement:Type (Bristol Stool Scale) 1-2, Frequency 1x/day, and Strain yes Fully empty rectum: No Leakage: No Pads: No Fiber supplement/laxative Yes - Miralax  URINATION: Pain with urination: No Fully empty bladder: Yes:   Stream: Strong Urgency: No Frequency: every 2 hours during the day Fluid Intake: not enough Leakage: Coughing, Sneezing, and Laughing Pads: No  INTERCOURSE:  Ability to have vaginal penetration Yes  Pain with intercourse: Initial Penetration, During Penetration, and Deep Penetration DrynessYes  Climax: less intense and harder to achieve Marinoff Scale: 1/3 Lubricant: yes  PREGNANCY: Vaginal deliveries 4 Tearing Yes:   Episiotomy No C-section deliveries 0 Currently pregnant No  PROLAPSE: Pressure and Bulge   OBJECTIVE:  Note: Objective measures were completed at Evaluation unless otherwise noted.   PATIENT SURVEYS:   PFIQ-7: 72, 33, 38 (combined, bladder, bowel)  COGNITION: Overall cognitive status: Within functional limits for tasks assessed     SENSATION: Light touch: Appears intact   FUNCTIONAL TESTS:  Squat:Rt weight shift  Single leg stance:  Rt: pelvic drop  Lt: stable  Curl-up test: abdominal distortion Sit-up test: 1/3 ACTIVE STRAIGHT LEG RAISE: (+) bil   GAIT: Assistive  device utilized: None Comments: WNL  POSTURE: increased lumbar lordosis, anterior pelvic tilt, and Rt posterior rotation   LUMBARAROM/PROM: WNL   PALPATION:   General: WNL  Pelvic Alignment: Rt posterior rotation  Abdominal: apical breathing pattern                External Perineal Exam: WNL                             Internal Pelvic Floor: WNL - palpation of cervix  Patient confirms identification and approves PT to assess internal pelvic floor and treatment Yes  PELVIC MMT:   MMT eval  Vaginal 2/5, 5 second hold, 3 repeat contractions  Internal Anal Sphincter   External Anal Sphincter   Puborectalis   Diastasis Recti 2-3 finger width separation  (Blank rows = not tested)        TONE: WNL  PROLAPSE: Grade 1 anterior vaginal wall laxity and uterine ligament laxity   TODAY'S TREATMENT:  DATE:  10/20/24 Manual: Prone L4-5 Grade 2-3 mobilizations  Myofascial release to low back, sacrum, coccyx Trigger point release to gluteal/hip rotator attachments  Therapeutic activities: Pt education provided on progress, exacerbations, pessary for when she knows she will be more active Expectations and understanding exacerbating factors Pt education on starting use of pelvic floor muscle wand again and reviewed techniques on model for how to use   10/07/24 Manual:  Neuromuscular re-education: Seated diagonal ball press 10x bil Seated hip adduction ball press with transversus abdominus and pelvic floor muscle 2 x 10 Seated hip adduction + bil shoulder abduction/extension + green band 2 x 10 Standing unilateral shoulder extension + green band 2 x 10 Standing bil shoulder extension + alternating march + green band  Therapeutic activities: Pt education on bowel movement consistency and how to know when to titrate  Squats to table + diaphragmatic  breathing + pelvic floor muscle contraction 2 x 10   09/28/24 Manual: Prone L4-5 Grade 2-3 mobilizations  Neuromuscular re-education: Bridge with hip adduction, transversus abdominus, and pelvic floor muscle 2 x 10 Bridge + hip abduction + red band 2 x 10 Bridge + march 10x Bridge + heel raises 2 x 10 Supine full shoulder flexion + 7 lbs 2 x 10 Bridge + unilateral chest press 7 lbs 10x bil Supine diagonal resisted knee flexion 10x bil Supine reverse table top heel taps 2 x 10   PATIENT EDUCATION:  Education details: See above Person educated: Patient Education method: Programmer, Multimedia, Demonstration, Tactile cues, Verbal cues, and Handouts Education comprehension: verbalized understanding  HOME EXERCISE PROGRAM: [id number]  ASSESSMENT:  CLINICAL IMPRESSION: Patient is a 43 y.o. female who was seen today for physical therapy  treatment for core weakness, constipation, and stress urinary incontinence. Pt had exacerbation of symptoms over the holidays and more discomfort in intercourse. We discussed how exacerbations can be common in treatment, especially when schedule doesn't allow for as much HEP and she tends to be on her feet more with preparations. She was encouraged to pay attention to activities that worsen symptoms and exercise accordingly and perform intervention that will help reduce increase in symptoms. Exacerbations should become more infrequent. We focused treatment today on manual techniques to low back, sacrum, coccyx, and muscles surrounding these areas. She tolerated well and reported improvements in tightness that she felt. HEP updated and printed out for her. She will continue to benefit from skilled PT intervention in order improve constipation, decrease urinary incontinence, decrease dyspareunia, address all impairments, and improve quality of life.   OBJECTIVE IMPAIRMENTS: decreased activity tolerance, decreased coordination, decreased endurance, decreased mobility,  decreased ROM, decreased strength, increased fascial restrictions, increased muscle spasms, impaired flexibility, impaired tone, improper body mechanics, postural dysfunction, and pain.   ACTIVITY LIMITATIONS: lifting and continence  PARTICIPATION LIMITATIONS: interpersonal relationship, community activity, occupation, and exercise  PERSONAL FACTORS: 1 comorbidity: medical history are also affecting patient's functional outcome.   REHAB POTENTIAL: Good  CLINICAL DECISION MAKING: Stable/uncomplicated  EVALUATION COMPLEXITY: Low   GOALS: Goals reviewed with patient? Yes  SHORT TERM GOALS: Target date: 07/20/24    Pt will be independent with HEP in order to improve activity tolerance.   Baseline: Goal status: MET 07/20/24  2.  Pt will be able to correctly perform diaphragmatic breathing and appropriate pressure management in order to prevent worsening vaginal wall/uterine ligament laxity and improve pelvic floor A/ROM.    Baseline: grade 1 anterior vaginal wall and uterine ligament laxity Goal status: IN PROGRESS 09/28/24  3.  Pt will have daily bowel movement with no straining and complete evacuation in order to put less pressure on uterine prolapse and improve pressure management.   Baseline: straining and incomplete Goal status:  IN PROGRESS 09/28/24  4.  Patient will be able to perform single leg stance with good pelvic stability in order to demonstrate appropriate pelvic floor muscle and transversus abdominus strength and coordination in order to decrease urinary incontinence.   Baseline:  Goal status:  IN PROGRESS 09/28/24   LONG TERM GOALS: Target date: 07/20/24  Pt will be independent with advanced HEP in order to improve activity tolerance.   Baseline:  Goal status:  IN PROGRESS 09/28/24  2.  Pt will report no leaks with laughing, coughing, sneezing in order to improve comfort with interpersonal relationships and community activities.   Baseline: leaking Goal  status:  IN PROGRESS 09/28/24  3.  Pt will report 0/10 pain with vaginal penetration in order to improve intimate relationship with partner.    Baseline: pain with certain positions  Goal status:  IN PROGRESS 09/28/24  4.  Pt will demonstrate 3/3 on sit-up test in order to have improved core strength that will allow her to avoid injury in the future.  Baseline: 1/3 Goal status:  IN PROGRESS 09/28/24  5.  Pt will demonstrate no abdominal distortion with curl-up test in order to prevent abnormal forces on pelvis/pelvic floor muscles to decrease risk of worsening prolapse and bowel/bladder dysfunction. Baseline: distortion Goal status:  IN PROGRESS 09/28/24  6.  Pt will demonstrate normal pelvic floor muscle tone and A/ROM, able to achieve 3/5 strength with contractions and 10 sec endurance, in order to reduce urinary leaking and number of pads patient wears.   Baseline: 2/5, 5 second endurance  Goal status:  IN PROGRESS 09/28/24  PLAN:  PT FREQUENCY: 1-2x/week  PT DURATION: 16 visits    PLANNED INTERVENTIONS: [id number]- PT Re-evaluation, 97110-Therapeutic exercises, 97530- Therapeutic activity, 97112- Neuromuscular re-education, 97535- Self Care, [id number]- Manual therapy, [phone]- Gait training, [phone]- Aquatic Therapy, [phone]- Electrical stimulation (unattended), [phone]- Traction (mechanical), [id number]- Ionotophoresis 4mg /ml Dexamethasone , 79439 (1-2 muscles), 20561 (3+ muscles)- Dry Needling, Patient/Family education, Balance training, Taping, Joint mobilization, Joint manipulation, Spinal manipulation, Spinal mobilization, Scar mobilization, Vestibular training, Cryotherapy, Moist heat, and Biofeedback  PLAN FOR NEXT SESSION: core strengthening; pressure management   Josette Mares, PT, DPT12/30/20259:33 AM   "

## 2024-10-28 ENCOUNTER — Ambulatory Visit: Attending: Sports Medicine

## 2024-10-28 DIAGNOSIS — M6281 Muscle weakness (generalized): Secondary | ICD-10-CM | POA: Diagnosis present

## 2024-10-28 DIAGNOSIS — R279 Unspecified lack of coordination: Secondary | ICD-10-CM | POA: Insufficient documentation

## 2024-10-28 MED ORDER — ADDYI 100 MG PO TABS: 100.0000 mg | ORAL_TABLET | Freq: Every day | ORAL | 11 refills | Status: AC

## 2024-10-28 NOTE — Therapy (Signed)
 " OUTPATIENT PHYSICAL THERAPY FEMALE PELVIC TREATMENT   Patient Name: Charlotte Nelson MRN: [id number] DOB:1981/08/25, 44 y.o., female Today's Date: 10/28/2024  END OF SESSION:  PT End of Session - 10/28/24 0902     Visit Number 12    Date for Recertification  11/25/24    Authorization Type [employer]    PT Start Time 0902    PT Stop Time 0930    PT Time Calculation (min) 28 min    Activity Tolerance Patient tolerated treatment well    Behavior During Therapy WFL for tasks assessed/performed          Past Medical History:  Diagnosis Date   Alopecia    Anemia    not currently   Anxiety    Depression    Family history of adverse reaction to anesthesia    PATERNAL GRANDFATHER HAD UNKNOWN PROBLEM  MANY YRS AGO - IT WASN'T A SERIOUS PROBLEM   Gastritis    GERD (gastroesophageal reflux disease)    Gestational diabetes    metformin  and lantus insulin   Gestational diabetes mellitus, class A2 07/07/2011   does not normally have diabetes   Headache(784.0)    migraine   Hypertension in pregnancy, preeclampsia, delivered 11/17/2020   Kidney stones    OCD (obsessive compulsive disorder)    Pregnancy induced hypertension    nifedipine    Tremor    familial tremor   Past Surgical History:  Procedure Laterality Date   CYSTOSCOPY W/ URETERAL STENT PLACEMENT Left 09/22/2015   Procedure: CYSTOSCOPY WITH LEFT  RETROGRADE PYELOGRAM/ STENT PLACEMENT;  Surgeon: Arlena Gal, MD;  Location: [facility];  Service: Urology;  Laterality: Left;   CYSTOSCOPY WITH RETROGRADE PYELOGRAM, URETEROSCOPY AND STENT PLACEMENT Bilateral 02/27/2022   Procedure: CYSTOSCOPY WITH RETROGRADE PYELOGRAM, URETEROSCOPY AND STENT PLACEMENT;  Surgeon: Elisabeth Valli BIRCH, MD;  Location: [facility];  Service: Urology;  Laterality: Bilateral;   HOLMIUM LASER APPLICATION Bilateral 02/27/2022   Procedure: HOLMIUM LASER APPLICATION;  Surgeon: Elisabeth Valli BIRCH, MD;  Location: [facility];  Service: Urology;  Laterality: Bilateral;    LITHOTRIPSY     MOUTH SURGERY     wisdom teeth and gum x2   Patient Active Problem List   Diagnosis Date Noted   Chronic constipation 07/21/2024   Family history of thyroid  disease 07/21/2024   Dry eye 07/21/2024   Establishing care with new doctor, encounter for 07/20/2024   Libido, decreased 05/08/2024   Inattention 10/29/2023   Hyponychia left ring finger 07/26/2023   Obesity (BMI 30-39.9) 05/21/2023   Prediabetes 04/02/2023   Thyroid  enlargement 09/20/2021   Superficial venous thrombosis of upper extremity, right 06/13/2021   Family history of DVT 06/13/2021   Family history of pulmonary embolism 06/13/2021   Right wrist pain 11/06/2019   Perennial allergic rhinitis 11/06/2019   Palpitations 09/11/2019   Hair thinning 05/22/2019   Screening for hyperlipidemia 04/27/2019   Cubital tunnel syndrome, left 02/24/2019   DDD (degenerative disc disease), lumbar 02/03/2019   Allergic conjunctivitis with xerophthalmia 01/22/2019   LPRD (laryngopharyngeal reflux disease) 11/25/2017   Migraine headache 07/07/2015   Annual physical exam 05/12/2015   Left nephrolithiasis 05/12/2015   Essential tremor 05/12/2015   Mood disorder 05/12/2015   Microcytosis 05/12/2015    PCP: Curtis Debby PARAS, MD  REFERRING PROVIDER: Curtis Debby PARAS, MD  REFERRING DIAG: R68.82 (ICD-10-CM) - Libido, decreased  THERAPY DIAG:  Muscle weakness (generalized)  Unspecified lack of coordination  Rationale for Evaluation and Treatment: Rehabilitation  ONSET DATE: 10/2019  SUBJECTIVE:  SUBJECTIVE STATEMENT: Pt states that she made an appointment with GYN to have pessary fit. She feels like her uterus is sitting a little higher.    PAIN: 09/28/24 Are you having pain? Yes NPRS scale: 0/10 Pain location: pain  during intercourse  Pain type: tender Pain description: intermittent   Aggravating factors: certain positions during intercourse  Relieving factors: no vaginal penetration  PRECAUTIONS: None  RED FLAGS: None   WEIGHT BEARING RESTRICTIONS: No  FALLS:  Has patient fallen in last 6 months? No  OCCUPATION: utlrasound technician   ACTIVITY LEVEL : none  PLOF: Independent  PATIENT GOALS: improve intercourse discomfort, decrease urinary incontinence, improve core strength, get active overall   PERTINENT HISTORY:  G4P4, GERD, lumbar DDD Sexual abuse: No  BOWEL MOVEMENT: Pain with bowel movement: No Type of bowel movement:Type (Bristol Stool Scale) 1-2, Frequency 1x/day, and Strain yes Fully empty rectum: No Leakage: No Pads: No Fiber supplement/laxative Yes - Miralax  URINATION: Pain with urination: No Fully empty bladder: Yes:   Stream: Strong Urgency: No Frequency: every 2 hours during the day Fluid Intake: not enough Leakage: Coughing, Sneezing, and Laughing Pads: No  INTERCOURSE:  Ability to have vaginal penetration Yes  Pain with intercourse: Initial Penetration, During Penetration, and Deep Penetration DrynessYes  Climax: less intense and harder to achieve Marinoff Scale: 1/3 Lubricant: yes  PREGNANCY: Vaginal deliveries 4 Tearing Yes:   Episiotomy No C-section deliveries 0 Currently pregnant No  PROLAPSE: Pressure and Bulge   OBJECTIVE:  Note: Objective measures were completed at Evaluation unless otherwise noted.   PATIENT SURVEYS:   PFIQ-7: 72, 33, 38 (combined, bladder, bowel)  COGNITION: Overall cognitive status: Within functional limits for tasks assessed     SENSATION: Light touch: Appears intact   FUNCTIONAL TESTS:  Squat:Rt weight shift  Single leg stance:  Rt: pelvic drop  Lt: stable  Curl-up test: abdominal distortion Sit-up test: 1/3 ACTIVE STRAIGHT LEG RAISE: (+) bil   GAIT: Assistive device utilized:  None Comments: WNL  POSTURE: increased lumbar lordosis, anterior pelvic tilt, and Rt posterior rotation   LUMBARAROM/PROM: WNL   PALPATION:   General: WNL  Pelvic Alignment: Rt posterior rotation  Abdominal: apical breathing pattern                External Perineal Exam: WNL                             Internal Pelvic Floor: WNL - palpation of cervix  Patient confirms identification and approves PT to assess internal pelvic floor and treatment Yes  PELVIC MMT:   MMT eval  Vaginal 2/5, 5 second hold, 3 repeat contractions  Internal Anal Sphincter   External Anal Sphincter   Puborectalis   Diastasis Recti 2-3 finger width separation  (Blank rows = not tested)        TONE: WNL  PROLAPSE: Grade 1 anterior vaginal wall laxity and uterine ligament laxity   TODAY'S TREATMENT:  DATE:  10/28/2024 Pt over 15 minutes late for appointment Neuromuscular re-education: Bridge with hip adduction, transversus abdominus, and pelvic floor muscle 2 x 10 Bridge with full shoulder flexion + alternating march + 5 lbs 2 x 10 Lower extremity ball rotations in supine 2 x 10 Dead bug 2 x 10 Modified side plank + clam shells 5x bil  10/20/24 Manual: Prone L4-5 Grade 2-3 mobilizations  Myofascial release to low back, sacrum, coccyx Trigger point release to gluteal/hip rotator attachments  Therapeutic activities: Pt education provided on progress, exacerbations, pessary for when she knows she will be more active Expectations and understanding exacerbating factors Pt education on starting use of pelvic floor muscle wand again and reviewed techniques on model for how to use   10/07/24 Manual:  Neuromuscular re-education: Seated diagonal ball press 10x bil Seated hip adduction ball press with transversus abdominus and pelvic floor muscle 2 x 10 Seated hip  adduction + bil shoulder abduction/extension + green band 2 x 10 Standing unilateral shoulder extension + green band 2 x 10 Standing bil shoulder extension + alternating march + green band  Therapeutic activities: Pt education on bowel movement consistency and how to know when to titrate  Squats to table + diaphragmatic breathing + pelvic floor muscle contraction 2 x 10    PATIENT EDUCATION:  Education details: See above Person educated: Patient Education method: Programmer, Multimedia, Demonstration, Tactile cues, Verbal cues, and Handouts Education comprehension: verbalized understanding  HOME EXERCISE PROGRAM: [id number]  ASSESSMENT:  CLINICAL IMPRESSION: Patient is a 44 y.o. female who was seen today for physical therapy  treatment for core weakness, constipation, and stress urinary incontinence. Pt feels like she is starting to see progress. She demonstrates clear improvements in core strength that allowed her to progress to very challenging exercises today. She will continue to benefit from skilled PT intervention in order improve constipation, decrease urinary incontinence, decrease dyspareunia, address all impairments, and improve quality of life.   OBJECTIVE IMPAIRMENTS: decreased activity tolerance, decreased coordination, decreased endurance, decreased mobility, decreased ROM, decreased strength, increased fascial restrictions, increased muscle spasms, impaired flexibility, impaired tone, improper body mechanics, postural dysfunction, and pain.   ACTIVITY LIMITATIONS: lifting and continence  PARTICIPATION LIMITATIONS: interpersonal relationship, community activity, occupation, and exercise  PERSONAL FACTORS: 1 comorbidity: medical history are also affecting patient's functional outcome.   REHAB POTENTIAL: Good  CLINICAL DECISION MAKING: Stable/uncomplicated  EVALUATION COMPLEXITY: Low   GOALS: Goals reviewed with patient? Yes  SHORT TERM GOALS: Target date: 07/20/24    Pt  will be independent with HEP in order to improve activity tolerance.   Baseline: Goal status: MET 07/20/24  2.  Pt will be able to correctly perform diaphragmatic breathing and appropriate pressure management in order to prevent worsening vaginal wall/uterine ligament laxity and improve pelvic floor A/ROM.    Baseline: grade 1 anterior vaginal wall and uterine ligament laxity Goal status: IN PROGRESS 09/28/24  3.  Pt will have daily bowel movement with no straining and complete evacuation in order to put less pressure on uterine prolapse and improve pressure management.   Baseline: straining and incomplete Goal status:  IN PROGRESS 09/28/24  4.  Patient will be able to perform single leg stance with good pelvic stability in order to demonstrate appropriate pelvic floor muscle and transversus abdominus strength and coordination in order to decrease urinary incontinence.   Baseline:  Goal status:  IN PROGRESS 09/28/24   LONG TERM GOALS: Target date: 07/20/24  Pt will be independent with advanced HEP in  order to improve activity tolerance.   Baseline:  Goal status:  IN PROGRESS 09/28/24  2.  Pt will report no leaks with laughing, coughing, sneezing in order to improve comfort with interpersonal relationships and community activities.   Baseline: leaking Goal status:  IN PROGRESS 09/28/24  3.  Pt will report 0/10 pain with vaginal penetration in order to improve intimate relationship with partner.    Baseline: pain with certain positions  Goal status:  IN PROGRESS 09/28/24  4.  Pt will demonstrate 3/3 on sit-up test in order to have improved core strength that will allow her to avoid injury in the future.  Baseline: 1/3 Goal status:  IN PROGRESS 09/28/24  5.  Pt will demonstrate no abdominal distortion with curl-up test in order to prevent abnormal forces on pelvis/pelvic floor muscles to decrease risk of worsening prolapse and bowel/bladder dysfunction. Baseline: distortion Goal  status:  IN PROGRESS 09/28/24  6.  Pt will demonstrate normal pelvic floor muscle tone and A/ROM, able to achieve 3/5 strength with contractions and 10 sec endurance, in order to reduce urinary leaking and number of pads patient wears.   Baseline: 2/5, 5 second endurance  Goal status:  IN PROGRESS 09/28/24  PLAN:  PT FREQUENCY: 1-2x/week  PT DURATION: 16 visits    PLANNED INTERVENTIONS: [id number]- PT Re-evaluation, 97110-Therapeutic exercises, 97530- Therapeutic activity, 97112- Neuromuscular re-education, 97535- Self Care, 02859- Manual therapy, [phone]- Gait training, [phone]- Aquatic Therapy, [phone]- Electrical stimulation (unattended), [phone]- Traction (mechanical), [id number]- Ionotophoresis 4mg /ml Dexamethasone , 79439 (1-2 muscles), 20561 (3+ muscles)- Dry Needling, Patient/Family education, Balance training, Taping, Joint mobilization, Joint manipulation, Spinal manipulation, Spinal mobilization, Scar mobilization, Vestibular training, Cryotherapy, Moist heat, and Biofeedback  PLAN FOR NEXT SESSION: core strengthening; pressure management   Josette Mares, PT, DPT1/7/20269:26 AM   "

## 2024-11-02 ENCOUNTER — Other Ambulatory Visit (HOSPITAL_COMMUNITY): Payer: Self-pay

## 2024-11-02 ENCOUNTER — Telehealth: Payer: Self-pay

## 2024-11-02 NOTE — Telephone Encounter (Signed)
 Pharmacy Patient Advocate Encounter   Received notification from Physician's Office that prior authorization for Addyi  100MG  tablets is required/requested.   Insurance verification completed.   The patient is insured through [facility].   Per test claim: PA required; PA started via CoverMyMeds. KEY [id number] . Waiting for clinical questions to populate.

## 2024-11-03 ENCOUNTER — Other Ambulatory Visit (HOSPITAL_COMMUNITY): Payer: Self-pay

## 2024-11-03 NOTE — Telephone Encounter (Signed)
 Please see details in separate encounter

## 2024-11-04 ENCOUNTER — Ambulatory Visit

## 2024-11-04 DIAGNOSIS — M6281 Muscle weakness (generalized): Secondary | ICD-10-CM

## 2024-11-04 DIAGNOSIS — R279 Unspecified lack of coordination: Secondary | ICD-10-CM

## 2024-11-04 NOTE — Therapy (Signed)
 " OUTPATIENT PHYSICAL THERAPY FEMALE PELVIC TREATMENT   Patient Name: Charlotte Nelson MRN: [id number] DOB:[birth year], 44 y.o., female Today's Date: 11/04/2024  END OF SESSION:  PT End of Session - 11/04/24 0848     Visit Number 13    Date for Recertification  11/25/24    Authorization Type [employer]    PT Start Time [phone]    PT Stop Time 0925    PT Time Calculation (min) 39 min    Activity Tolerance Patient tolerated treatment well    Behavior During Therapy WFL for tasks assessed/performed          Past Medical History:  Diagnosis Date   Alopecia    Anemia    not currently   Anxiety    Depression    Family history of adverse reaction to anesthesia    PATERNAL GRANDFATHER HAD UNKNOWN PROBLEM  MANY YRS AGO - IT WASN'T A SERIOUS PROBLEM   Gastritis    GERD (gastroesophageal reflux disease)    Gestational diabetes    metformin  and lantus insulin   Gestational diabetes mellitus, class A2 07/07/2011   does not normally have diabetes   Headache(784.0)    migraine   Hypertension in pregnancy, preeclampsia, delivered 11/17/2020   Kidney stones    OCD (obsessive compulsive disorder)    Pregnancy induced hypertension    nifedipine    Tremor    familial tremor   Past Surgical History:  Procedure Laterality Date   CYSTOSCOPY W/ URETERAL STENT PLACEMENT Left 09/22/2015   Procedure: CYSTOSCOPY WITH LEFT  RETROGRADE PYELOGRAM/ STENT PLACEMENT;  Surgeon: Arlena Gal, MD;  Location: [facility];  Service: Urology;  Laterality: Left;   CYSTOSCOPY WITH RETROGRADE PYELOGRAM, URETEROSCOPY AND STENT PLACEMENT Bilateral 02/27/2022   Procedure: CYSTOSCOPY WITH RETROGRADE PYELOGRAM, URETEROSCOPY AND STENT PLACEMENT;  Surgeon: Elisabeth Valli BIRCH, MD;  Location: [facility];  Service: Urology;  Laterality: Bilateral;   HOLMIUM LASER APPLICATION Bilateral 02/27/2022   Procedure: HOLMIUM LASER APPLICATION;  Surgeon: Elisabeth Valli BIRCH, MD;  Location: [facility];  Service: Urology;  Laterality: Bilateral;    LITHOTRIPSY     MOUTH SURGERY     wisdom teeth and gum x2   Patient Active Problem List   Diagnosis Date Noted   Chronic constipation 07/21/2024   Family history of thyroid  disease 07/21/2024   Dry eye 07/21/2024   Establishing care with new doctor, encounter for 07/20/2024   Libido, decreased 05/08/2024   Inattention 10/29/2023   Hyponychia left ring finger 07/26/2023   Obesity (BMI 30-39.9) 05/21/2023   Prediabetes 04/02/2023   Thyroid  enlargement 09/20/2021   Superficial venous thrombosis of upper extremity, right 06/13/2021   Family history of DVT 06/13/2021   Family history of pulmonary embolism 06/13/2021   Right wrist pain 11/06/2019   Perennial allergic rhinitis 11/06/2019   Palpitations 09/11/2019   Hair thinning 05/22/2019   Screening for hyperlipidemia 04/27/2019   Cubital tunnel syndrome, left 02/24/2019   DDD (degenerative disc disease), lumbar 02/03/2019   Allergic conjunctivitis with xerophthalmia 01/22/2019   LPRD (laryngopharyngeal reflux disease) 11/25/2017   Migraine headache 07/07/2015   Annual physical exam 05/12/2015   Left nephrolithiasis 05/12/2015   Essential tremor 05/12/2015   Mood disorder 05/12/2015   Microcytosis 05/12/2015    PCP: Curtis Debby PARAS, MD  REFERRING PROVIDER: Curtis Debby PARAS, MD  REFERRING DIAG: R68.82 (ICD-10-CM) - Libido, decreased  THERAPY DIAG:  Muscle weakness (generalized)  Unspecified lack of coordination  Rationale for Evaluation and Treatment: Rehabilitation  ONSET DATE: 10/2019  SUBJECTIVE:  SUBJECTIVE STATEMENT: Pt doing very well overall. She was able to have intercourse without pain and states that it felt more normal.    PAIN: 09/28/24 Are you having pain? Yes NPRS scale: 0/10 Pain location: pain during  intercourse  Pain type: tender Pain description: intermittent   Aggravating factors: certain positions during intercourse  Relieving factors: no vaginal penetration  PRECAUTIONS: None  RED FLAGS: None   WEIGHT BEARING RESTRICTIONS: No  FALLS:  Has patient fallen in last 6 months? No  OCCUPATION: utlrasound technician   ACTIVITY LEVEL : none  PLOF: Independent  PATIENT GOALS: improve intercourse discomfort, decrease urinary incontinence, improve core strength, get active overall   PERTINENT HISTORY:  G4P4, GERD, lumbar DDD Sexual abuse: No  BOWEL MOVEMENT: Pain with bowel movement: No Type of bowel movement:Type (Bristol Stool Scale) 1-2, Frequency 1x/day, and Strain yes Fully empty rectum: No Leakage: No Pads: No Fiber supplement/laxative Yes - Miralax  URINATION: Pain with urination: No Fully empty bladder: Yes:   Stream: Strong Urgency: No Frequency: every 2 hours during the day Fluid Intake: not enough Leakage: Coughing, Sneezing, and Laughing Pads: No  INTERCOURSE:  Ability to have vaginal penetration Yes  Pain with intercourse: Initial Penetration, During Penetration, and Deep Penetration DrynessYes  Climax: less intense and harder to achieve Marinoff Scale: 1/3 Lubricant: yes  PREGNANCY: Vaginal deliveries 4 Tearing Yes:   Episiotomy No C-section deliveries 0 Currently pregnant No  PROLAPSE: Pressure and Bulge   OBJECTIVE:  Note: Objective measures were completed at Evaluation unless otherwise noted.   PATIENT SURVEYS:   PFIQ-7: 72, 33, 38 (combined, bladder, bowel)  COGNITION: Overall cognitive status: Within functional limits for tasks assessed     SENSATION: Light touch: Appears intact   FUNCTIONAL TESTS:  Squat:Rt weight shift  Single leg stance:  Rt: pelvic drop  Lt: stable  Curl-up test: abdominal distortion Sit-up test: 1/3 ACTIVE STRAIGHT LEG RAISE: (+) bil   GAIT: Assistive device utilized: None Comments:  WNL  POSTURE: increased lumbar lordosis, anterior pelvic tilt, and Rt posterior rotation   LUMBARAROM/PROM: WNL   PALPATION:   General: WNL  Pelvic Alignment: Rt posterior rotation  Abdominal: apical breathing pattern                External Perineal Exam: WNL                             Internal Pelvic Floor: WNL - palpation of cervix  Patient confirms identification and approves PT to assess internal pelvic floor and treatment Yes  PELVIC MMT:   MMT eval  Vaginal 2/5, 5 second hold, 3 repeat contractions  Internal Anal Sphincter   External Anal Sphincter   Puborectalis   Diastasis Recti 2-3 finger width separation  (Blank rows = not tested)        TONE: WNL  PROLAPSE: Grade 1 anterior vaginal wall laxity and uterine ligament laxity   TODAY'S TREATMENT:  DATE:  11/04/2024 Manual: Prone: Grade 2-3 PA mobilizations  Grade 3 rotational thoracic/lumbar mobilizations  Myofascial release to low back Neuromuscular re-education: Bridge holds + adduction pulses 2 x 90 seconds Bridge holds + abduction pulses 2 x 90 seconds  Modified side plank + clam shells 5x bil Dead bug 2 x 10 Wall ball plank + march 2 x 10 Standing march + 3 lbs 2 x 10 Exercises: Seated lower thoracic extensions on foam roller 10x  Supine lower trunk rotation 2 x 10   10/28/2024 Pt over 15 minutes late for appointment Neuromuscular re-education: Bridge with hip adduction, transversus abdominus, and pelvic floor muscle 2 x 10 Bridge with full shoulder flexion + alternating march + 5 lbs 2 x 10 Lower extremity ball rotations in supine 2 x 10 Dead bug 2 x 10 Modified side plank + clam shells 5x bil  10/20/24 Manual: Prone L4-5 Grade 2-3 mobilizations  Myofascial release to low back, sacrum, coccyx Trigger point release to gluteal/hip rotator attachments  Therapeutic  activities: Pt education provided on progress, exacerbations, pessary for when she knows she will be more active Expectations and understanding exacerbating factors Pt education on starting use of pelvic floor muscle wand again and reviewed techniques on model for how to use    PATIENT EDUCATION:  Education details: See above Person educated: Patient Education method: Explanation, Demonstration, Tactile cues, Verbal cues, and Handouts Education comprehension: verbalized understanding  HOME EXERCISE PROGRAM: [id number]  ASSESSMENT:  CLINICAL IMPRESSION: Patient is a 44 y.o. female who was seen today for physical therapy  treatment for core weakness, constipation, and stress urinary incontinence. Pt doing very well, reporting less pain and discomfort with intercourse. She did very well with exercise progressions today. We did begin treatment with manual mobilizations to lower thoracic and lumbar spines to help improve tightness. She will continue to benefit from skilled PT intervention in order improve constipation, decrease urinary incontinence, decrease dyspareunia, address all impairments, and improve quality of life.   OBJECTIVE IMPAIRMENTS: decreased activity tolerance, decreased coordination, decreased endurance, decreased mobility, decreased ROM, decreased strength, increased fascial restrictions, increased muscle spasms, impaired flexibility, impaired tone, improper body mechanics, postural dysfunction, and pain.   ACTIVITY LIMITATIONS: lifting and continence  PARTICIPATION LIMITATIONS: interpersonal relationship, community activity, occupation, and exercise  PERSONAL FACTORS: 1 comorbidity: medical history are also affecting patient's functional outcome.   REHAB POTENTIAL: Good  CLINICAL DECISION MAKING: Stable/uncomplicated  EVALUATION COMPLEXITY: Low   GOALS: Goals reviewed with patient? Yes  SHORT TERM GOALS: Target date: 07/20/24    Pt will be independent with HEP  in order to improve activity tolerance.   Baseline: Goal status: MET 07/20/24  2.  Pt will be able to correctly perform diaphragmatic breathing and appropriate pressure management in order to prevent worsening vaginal wall/uterine ligament laxity and improve pelvic floor A/ROM.    Baseline: grade 1 anterior vaginal wall and uterine ligament laxity Goal status: IN PROGRESS 09/28/24  3.  Pt will have daily bowel movement with no straining and complete evacuation in order to put less pressure on uterine prolapse and improve pressure management.   Baseline: straining and incomplete Goal status:  IN PROGRESS 09/28/24  4.  Patient will be able to perform single leg stance with good pelvic stability in order to demonstrate appropriate pelvic floor muscle and transversus abdominus strength and coordination in order to decrease urinary incontinence.   Baseline:  Goal status:  IN PROGRESS 09/28/24   LONG TERM GOALS: Target date: 07/20/24  Pt will  be independent with advanced HEP in order to improve activity tolerance.   Baseline:  Goal status:  IN PROGRESS 09/28/24  2.  Pt will report no leaks with laughing, coughing, sneezing in order to improve comfort with interpersonal relationships and community activities.   Baseline: leaking Goal status:  IN PROGRESS 09/28/24  3.  Pt will report 0/10 pain with vaginal penetration in order to improve intimate relationship with partner.    Baseline: pain with certain positions  Goal status:  IN PROGRESS 09/28/24  4.  Pt will demonstrate 3/3 on sit-up test in order to have improved core strength that will allow her to avoid injury in the future.  Baseline: 1/3 Goal status:  IN PROGRESS 09/28/24  5.  Pt will demonstrate no abdominal distortion with curl-up test in order to prevent abnormal forces on pelvis/pelvic floor muscles to decrease risk of worsening prolapse and bowel/bladder dysfunction. Baseline: distortion Goal status:  IN PROGRESS  09/28/24  6.  Pt will demonstrate normal pelvic floor muscle tone and A/ROM, able to achieve 3/5 strength with contractions and 10 sec endurance, in order to reduce urinary leaking and number of pads patient wears.   Baseline: 2/5, 5 second endurance  Goal status:  IN PROGRESS 09/28/24  PLAN:  PT FREQUENCY: 1-2x/week  PT DURATION: 16 visits    PLANNED INTERVENTIONS: [id number]- PT Re-evaluation, 97110-Therapeutic exercises, 97530- Therapeutic activity, 97112- Neuromuscular re-education, 97535- Self Care, 02859- Manual therapy, [phone]- Gait training, [phone]- Aquatic Therapy, [phone]- Electrical stimulation (unattended), [phone]- Traction (mechanical), [id number]- Ionotophoresis 4mg /ml Dexamethasone , 79439 (1-2 muscles), 20561 (3+ muscles)- Dry Needling, Patient/Family education, Balance training, Taping, Joint mobilization, Joint manipulation, Spinal manipulation, Spinal mobilization, Scar mobilization, Vestibular training, Cryotherapy, Moist heat, and Biofeedback  PLAN FOR NEXT SESSION: core strengthening; pressure management   Josette Mares, PT, DPT1/14/20269:27 AM   "

## 2024-11-05 NOTE — Telephone Encounter (Signed)
 Pharmacy Patient Advocate Encounter  Received notification from [employer] that Prior Authorization for Addyi  100MG  tablets  has been DENIED.  Full denial letter will be uploaded to the media tab. See denial reason below.  Low sexual desire must not be due to co-existing medical/psychiatric condition, relationship problems, or medication/drug effects. Patient does not meet these criteria.   PA #/Case ID/Reference #: [id number]

## 2024-11-11 ENCOUNTER — Ambulatory Visit

## 2024-11-11 NOTE — Telephone Encounter (Signed)
 Per the patient - an authorization was approved with OB/GYN office. The request is good to close.

## 2024-11-18 ENCOUNTER — Ambulatory Visit

## 2024-11-18 DIAGNOSIS — M6281 Muscle weakness (generalized): Secondary | ICD-10-CM | POA: Diagnosis not present

## 2024-11-18 DIAGNOSIS — R279 Unspecified lack of coordination: Secondary | ICD-10-CM

## 2024-11-18 NOTE — Therapy (Signed)
 " OUTPATIENT PHYSICAL THERAPY FEMALE PELVIC TREATMENT   Patient Name: Charlotte Nelson MRN: [id number] DOB:29-Nov-1980, 44 y.o., female Today's Date: 11/18/2024  END OF SESSION:  PT End of Session - 11/18/24 0848     Visit Number 14    Date for Recertification  11/25/24    Authorization Type [employer]    PT Start Time [phone]    PT Stop Time 0940    PT Time Calculation (min) 54 min    Activity Tolerance Patient tolerated treatment well    Behavior During Therapy WFL for tasks assessed/performed          Past Medical History:  Diagnosis Date   Alopecia    Anemia    not currently   Anxiety    Depression    Family history of adverse reaction to anesthesia    PATERNAL GRANDFATHER HAD UNKNOWN PROBLEM  MANY YRS AGO - IT WASN'T A SERIOUS PROBLEM   Gastritis    GERD (gastroesophageal reflux disease)    Gestational diabetes    metformin  and lantus insulin   Gestational diabetes mellitus, class A2 07/07/2011   does not normally have diabetes   Headache(784.0)    migraine   Hypertension in pregnancy, preeclampsia, delivered 11/17/2020   Kidney stones    OCD (obsessive compulsive disorder)    Pregnancy induced hypertension    nifedipine    Tremor    familial tremor   Past Surgical History:  Procedure Laterality Date   CYSTOSCOPY W/ URETERAL STENT PLACEMENT Left 09/22/2015   Procedure: CYSTOSCOPY WITH LEFT  RETROGRADE PYELOGRAM/ STENT PLACEMENT;  Surgeon: Arlena Gal, MD;  Location: [facility];  Service: Urology;  Laterality: Left;   CYSTOSCOPY WITH RETROGRADE PYELOGRAM, URETEROSCOPY AND STENT PLACEMENT Bilateral 02/27/2022   Procedure: CYSTOSCOPY WITH RETROGRADE PYELOGRAM, URETEROSCOPY AND STENT PLACEMENT;  Surgeon: Elisabeth Valli BIRCH, MD;  Location: [facility];  Service: Urology;  Laterality: Bilateral;   HOLMIUM LASER APPLICATION Bilateral 02/27/2022   Procedure: HOLMIUM LASER APPLICATION;  Surgeon: Elisabeth Valli BIRCH, MD;  Location: [facility];  Service: Urology;  Laterality: Bilateral;    LITHOTRIPSY     MOUTH SURGERY     wisdom teeth and gum x2   Patient Active Problem List   Diagnosis Date Noted   Chronic constipation 07/21/2024   Family history of thyroid  disease 07/21/2024   Dry eye 07/21/2024   Establishing care with new doctor, encounter for 07/20/2024   Libido, decreased 05/08/2024   Inattention 10/29/2023   Hyponychia left ring finger 07/26/2023   Obesity (BMI 30-39.9) 05/21/2023   Prediabetes 04/02/2023   Thyroid  enlargement 09/20/2021   Superficial venous thrombosis of upper extremity, right 06/13/2021   Family history of DVT 06/13/2021   Family history of pulmonary embolism 06/13/2021   Right wrist pain 11/06/2019   Perennial allergic rhinitis 11/06/2019   Palpitations 09/11/2019   Hair thinning 05/22/2019   Screening for hyperlipidemia 04/27/2019   Cubital tunnel syndrome, left 02/24/2019   DDD (degenerative disc disease), lumbar 02/03/2019   Allergic conjunctivitis with xerophthalmia 01/22/2019   LPRD (laryngopharyngeal reflux disease) 11/25/2017   Migraine headache 07/07/2015   Annual physical exam 05/12/2015   Left nephrolithiasis 05/12/2015   Essential tremor 05/12/2015   Mood disorder 05/12/2015   Microcytosis 05/12/2015    PCP: Curtis Debby PARAS, MD  REFERRING PROVIDER: Curtis Debby PARAS, MD  REFERRING DIAG: R68.82 (ICD-10-CM) - Libido, decreased  THERAPY DIAG:  Muscle weakness (generalized)  Unspecified lack of coordination  Rationale for Evaluation and Treatment: Rehabilitation  ONSET DATE: 10/2019  SUBJECTIVE:  SUBJECTIVE STATEMENT: Pt states that she has been to see OBGYN and was referred to urogyn to discuss surgical options for uterine prolapse. She was also fit for a pessary. She did very well with a pessary. MD feels like her rapid  weight loss has impacted hormones in a way that is affecting her libido and her prolapse recovery.    PAIN: 09/28/24 Are you having pain? Yes NPRS scale: 0/10 Pain location: pain during intercourse  Pain type: tender Pain description: intermittent   Aggravating factors: certain positions during intercourse  Relieving factors: no vaginal penetration  PRECAUTIONS: None  RED FLAGS: None   WEIGHT BEARING RESTRICTIONS: No  FALLS:  Has patient fallen in last 6 months? No  OCCUPATION: utlrasound technician   ACTIVITY LEVEL : none  PLOF: Independent  PATIENT GOALS: improve intercourse discomfort, decrease urinary incontinence, improve core strength, get active overall   PERTINENT HISTORY:  G4P4, GERD, lumbar DDD Sexual abuse: No  BOWEL MOVEMENT: Pain with bowel movement: No Type of bowel movement:Type (Bristol Stool Scale) 1-2, Frequency 1x/day, and Strain yes Fully empty rectum: No Leakage: No Pads: No Fiber supplement/laxative Yes - Miralax  URINATION: Pain with urination: No Fully empty bladder: Yes:   Stream: Strong Urgency: No Frequency: every 2 hours during the day Fluid Intake: not enough Leakage: Coughing, Sneezing, and Laughing Pads: No  INTERCOURSE:  Ability to have vaginal penetration Yes  Pain with intercourse: Initial Penetration, During Penetration, and Deep Penetration DrynessYes  Climax: less intense and harder to achieve Marinoff Scale: 1/3 Lubricant: yes  PREGNANCY: Vaginal deliveries 4 Tearing Yes:   Episiotomy No C-section deliveries 0 Currently pregnant No  PROLAPSE: Pressure and Bulge   OBJECTIVE:  Note: Objective measures were completed at Evaluation unless otherwise noted.   PATIENT SURVEYS:   PFIQ-7: 72, 33, 38 (combined, bladder, bowel)  COGNITION: Overall cognitive status: Within functional limits for tasks assessed     SENSATION: Light touch: Appears intact   FUNCTIONAL TESTS:  Squat:Rt weight shift  Single  leg stance:  Rt: pelvic drop  Lt: stable  Curl-up test: abdominal distortion Sit-up test: 1/3 ACTIVE STRAIGHT LEG RAISE: (+) bil   GAIT: Assistive device utilized: None Comments: WNL  POSTURE: increased lumbar lordosis, anterior pelvic tilt, and Rt posterior rotation   LUMBARAROM/PROM: WNL   PALPATION:   General: WNL  Pelvic Alignment: Rt posterior rotation  Abdominal: apical breathing pattern                External Perineal Exam: WNL                             Internal Pelvic Floor: WNL - palpation of cervix  Patient confirms identification and approves PT to assess internal pelvic floor and treatment Yes  PELVIC MMT:   MMT eval  Vaginal 2/5, 5 second hold, 3 repeat contractions  Internal Anal Sphincter   External Anal Sphincter   Puborectalis   Diastasis Recti 2-3 finger width separation  (Blank rows = not tested)        TONE: WNL  PROLAPSE: Grade 1 anterior vaginal wall laxity and uterine ligament laxity   TODAY'S TREATMENT:  DATE:  11/18/24 Manual: Prone: Grade 2-3 PA mobilizations  Grade 3 rotational thoracic/lumbar mobilizations  Myofascial release to low back Mobilization with movement to sacral base using windshield wipers with bil LE Therapeutic activities: Pt education:  on impact of hormones of cervix Objective improvements in her condition over the last several weeks/months Reasonable to assume she will continue making improvements with continued exercise Impact of hormones on libido Pessary and what is reasonable to feel - making sure she is not straining too much with bowel movements due to dislodgement    11/04/2024 Manual: Prone: Grade 2-3 PA mobilizations  Grade 3 rotational thoracic/lumbar mobilizations  Myofascial release to low back Neuromuscular re-education: Bridge holds + adduction pulses 2 x 90  seconds Bridge holds + abduction pulses 2 x 90 seconds  Modified side plank + clam shells 5x bil Dead bug 2 x 10 Wall ball plank + march 2 x 10 Standing march + 3 lbs 2 x 10 Exercises: Seated lower thoracic extensions on foam roller 10x  Supine lower trunk rotation 2 x 10   10/28/2024 Pt over 15 minutes late for appointment Neuromuscular re-education: Bridge with hip adduction, transversus abdominus, and pelvic floor muscle 2 x 10 Bridge with full shoulder flexion + alternating march + 5 lbs 2 x 10 Lower extremity ball rotations in supine 2 x 10 Dead bug 2 x 10 Modified side plank + clam shells 5x bil    PATIENT EDUCATION:  Education details: See above Person educated: Patient Education method: Explanation, Demonstration, Tactile cues, Verbal cues, and Handouts Education comprehension: verbalized understanding  HOME EXERCISE PROGRAM: [id number]  ASSESSMENT:  CLINICAL IMPRESSION: Patient is a 44 y.o. female who was seen today for physical therapy  treatment for core weakness, constipation, and stress urinary incontinence. Signfiicnat time this session spent on patient education after patient has met wit hdoctor and been encouraged to see urogyn for hysterectomy consult. We went over pessaries, hormone impact on condition (due to recent weight loss), her current progress and how she has been doing better, and reasonable expectations to believe she will continue making progress. We also continued manual techniques to low back to help improve tightness and restriction with good tolerance. We will plan to return to more strength based training next session. She will continue to benefit from skilled PT intervention in order improve constipation, decrease urinary incontinence, decrease dyspareunia, address all impairments, and improve quality of life.   OBJECTIVE IMPAIRMENTS: decreased activity tolerance, decreased coordination, decreased endurance, decreased mobility, decreased ROM,  decreased strength, increased fascial restrictions, increased muscle spasms, impaired flexibility, impaired tone, improper body mechanics, postural dysfunction, and pain.   ACTIVITY LIMITATIONS: lifting and continence  PARTICIPATION LIMITATIONS: interpersonal relationship, community activity, occupation, and exercise  PERSONAL FACTORS: 1 comorbidity: medical history are also affecting patient's functional outcome.   REHAB POTENTIAL: Good  CLINICAL DECISION MAKING: Stable/uncomplicated  EVALUATION COMPLEXITY: Low   GOALS: Goals reviewed with patient? Yes  SHORT TERM GOALS: Target date: 07/20/24    Pt will be independent with HEP in order to improve activity tolerance.   Baseline: Goal status: MET 07/20/24  2.  Pt will be able to correctly perform diaphragmatic breathing and appropriate pressure management in order to prevent worsening vaginal wall/uterine ligament laxity and improve pelvic floor A/ROM.    Baseline: grade 1 anterior vaginal wall and uterine ligament laxity Goal status: IN PROGRESS 11/18/24  3.  Pt will have daily bowel movement with no straining and complete evacuation in order to put less pressure on  uterine prolapse and improve pressure management.   Baseline: straining and incomplete Goal status:  IN PROGRESS 11/18/24  4.  Patient will be able to perform single leg stance with good pelvic stability in order to demonstrate appropriate pelvic floor muscle and transversus abdominus strength and coordination in order to decrease urinary incontinence.   Baseline:  Goal status:  IN PROGRESS 11/18/24   LONG TERM GOALS: Target date: 07/20/24  Pt will be independent with advanced HEP in order to improve activity tolerance.   Baseline:  Goal status:  IN PROGRESS 11/18/24  2.  Pt will report no leaks with laughing, coughing, sneezing in order to improve comfort with interpersonal relationships and community activities.   Baseline: leaking Goal status:  IN  PROGRESS 11/18/24  3.  Pt will report 0/10 pain with vaginal penetration in order to improve intimate relationship with partner.    Baseline: pain with certain positions  Goal status:  IN PROGRESS 11/18/24  4.  Pt will demonstrate 3/3 on sit-up test in order to have improved core strength that will allow her to avoid injury in the future.  Baseline: 1/3 Goal status:  IN PROGRESS 11/18/24  5.  Pt will demonstrate no abdominal distortion with curl-up test in order to prevent abnormal forces on pelvis/pelvic floor muscles to decrease risk of worsening prolapse and bowel/bladder dysfunction. Baseline: distortion Goal status:  IN PROGRESS 11/18/24  6.  Pt will demonstrate normal pelvic floor muscle tone and A/ROM, able to achieve 3/5 strength with contractions and 10 sec endurance, in order to reduce urinary leaking and number of pads patient wears.   Baseline: 2/5, 5 second endurance  Goal status:  IN PROGRESS 11/18/24  PLAN:  PT FREQUENCY: 1-2x/week  PT DURATION: 16 visits    PLANNED INTERVENTIONS: [id number]- PT Re-evaluation, 97110-Therapeutic exercises, 97530- Therapeutic activity, 97112- Neuromuscular re-education, 97535- Self Care, [id number]- Manual therapy, [phone]- Gait training, [phone]- Aquatic Therapy, [phone]- Electrical stimulation (unattended), [phone]- Traction (mechanical), [id number]- Ionotophoresis 4mg /ml Dexamethasone , [id number] (1-2 muscles), [id number] (3+ muscles)- Dry Needling, Patient/Family education, Balance training, Taping, Joint mobilization, Joint manipulation, Spinal manipulation, Spinal mobilization, Scar mobilization, Vestibular training, Cryotherapy, Moist heat, and Biofeedback  PLAN FOR NEXT SESSION: core strengthening; pressure management   Josette Mares, PT, DPT01/28/2610:01 AM   "

## 2024-11-25 ENCOUNTER — Ambulatory Visit

## 2024-11-25 DIAGNOSIS — M6281 Muscle weakness (generalized): Secondary | ICD-10-CM

## 2024-11-25 DIAGNOSIS — R279 Unspecified lack of coordination: Secondary | ICD-10-CM

## 2024-11-25 NOTE — Therapy (Signed)
 " OUTPATIENT PHYSICAL THERAPY FEMALE PELVIC TREATMENT   Patient Name: Charlotte Nelson MRN: [id number] DOB:Aug 26, 1981, 44 y.o., female Today's Date: 11/25/2024  END OF SESSION:  PT End of Session - 11/25/24 0852     Visit Number 15    Date for Recertification  05/12/25    Authorization Type [employer]    PT Start Time 0847    PT Stop Time 0926    PT Time Calculation (min) 39 min    Activity Tolerance Patient tolerated treatment well    Behavior During Therapy WFL for tasks assessed/performed          Past Medical History:  Diagnosis Date   Alopecia    Anemia    not currently   Anxiety    Depression    Family history of adverse reaction to anesthesia    PATERNAL GRANDFATHER HAD UNKNOWN PROBLEM  MANY YRS AGO - IT WASN'T A SERIOUS PROBLEM   Gastritis    GERD (gastroesophageal reflux disease)    Gestational diabetes    metformin  and lantus insulin   Gestational diabetes mellitus, class A2 07/07/2011   does not normally have diabetes   Headache(784.0)    migraine   Hypertension in pregnancy, preeclampsia, delivered 11/17/2020   Kidney stones    OCD (obsessive compulsive disorder)    Pregnancy induced hypertension    nifedipine    Tremor    familial tremor   Past Surgical History:  Procedure Laterality Date   CYSTOSCOPY W/ URETERAL STENT PLACEMENT Left 09/22/2015   Procedure: CYSTOSCOPY WITH LEFT  RETROGRADE PYELOGRAM/ STENT PLACEMENT;  Surgeon: Arlena Gal, MD;  Location: [facility];  Service: Urology;  Laterality: Left;   CYSTOSCOPY WITH RETROGRADE PYELOGRAM, URETEROSCOPY AND STENT PLACEMENT Bilateral 02/27/2022   Procedure: CYSTOSCOPY WITH RETROGRADE PYELOGRAM, URETEROSCOPY AND STENT PLACEMENT;  Surgeon: Elisabeth Valli BIRCH, MD;  Location: [facility];  Service: Urology;  Laterality: Bilateral;   HOLMIUM LASER APPLICATION Bilateral 02/27/2022   Procedure: HOLMIUM LASER APPLICATION;  Surgeon: Elisabeth Valli BIRCH, MD;  Location: [facility];  Service: Urology;  Laterality: Bilateral;    LITHOTRIPSY     MOUTH SURGERY     wisdom teeth and gum x2   Patient Active Problem List   Diagnosis Date Noted   Chronic constipation 07/21/2024   Family history of thyroid  disease 07/21/2024   Dry eye 07/21/2024   Establishing care with new doctor, encounter for 07/20/2024   Libido, decreased 05/08/2024   Inattention 10/29/2023   Hyponychia left ring finger 07/26/2023   Obesity (BMI 30-39.9) 05/21/2023   Prediabetes 04/02/2023   Thyroid  enlargement 09/20/2021   Superficial venous thrombosis of upper extremity, right 06/13/2021   Family history of DVT 06/13/2021   Family history of pulmonary embolism 06/13/2021   Right wrist pain 11/06/2019   Perennial allergic rhinitis 11/06/2019   Palpitations 09/11/2019   Hair thinning 05/22/2019   Screening for hyperlipidemia 04/27/2019   Cubital tunnel syndrome, left 02/24/2019   DDD (degenerative disc disease), lumbar 02/03/2019   Allergic conjunctivitis with xerophthalmia 01/22/2019   LPRD (laryngopharyngeal reflux disease) 11/25/2017   Migraine headache 07/07/2015   Annual physical exam 05/12/2015   Left nephrolithiasis 05/12/2015   Essential tremor 05/12/2015   Mood disorder 05/12/2015   Microcytosis 05/12/2015    PCP: Curtis Debby PARAS, MD  REFERRING PROVIDER: Curtis Debby PARAS, MD  REFERRING DIAG: R68.82 (ICD-10-CM) - Libido, decreased  THERAPY DIAG:  Muscle weakness (generalized)  Unspecified lack of coordination  Rationale for Evaluation and Treatment: Rehabilitation  ONSET DATE: 10/2019  SUBJECTIVE:  SUBJECTIVE STATEMENT: Pt has had different pessary option given to her that feels easier to push back up, but it still comes out when she has a bowel movement. She has some increased urinary urgency when wearing pessary.     PAIN: 11/25/24 Are you having pain? Yes NPRS scale: 2/10 Pain location: low back pain; pain during intercourse  Pain type: tender Pain description: intermittent   Aggravating factors: certain positions during intercourse  Relieving factors: no vaginal penetration  PRECAUTIONS: None  RED FLAGS: None   WEIGHT BEARING RESTRICTIONS: No  FALLS:  Has patient fallen in last 6 months? No  OCCUPATION: utlrasound technician   ACTIVITY LEVEL : none  PLOF: Independent  PATIENT GOALS: improve intercourse discomfort, decrease urinary incontinence, improve core strength, get active overall   PERTINENT HISTORY:  G4P4, GERD, lumbar DDD Sexual abuse: No  BOWEL MOVEMENT: Pain with bowel movement: No Type of bowel movement:Type (Bristol Stool Scale) 1-2, Frequency 1x/day, and Strain yes Fully empty rectum: No Leakage: No Pads: No Fiber supplement/laxative Yes - Miralax  URINATION: Pain with urination: No Fully empty bladder: Yes:   Stream: Strong Urgency: No Frequency: every 2 hours during the day Fluid Intake: not enough Leakage: Coughing, Sneezing, and Laughing Pads: No  INTERCOURSE:  Ability to have vaginal penetration Yes  Pain with intercourse: Initial Penetration, During Penetration, and Deep Penetration DrynessYes  Climax: less intense and harder to achieve Marinoff Scale: 1/3 Lubricant: yes  PREGNANCY: Vaginal deliveries 4 Tearing Yes:   Episiotomy No C-section deliveries 0 Currently pregnant No  PROLAPSE: Pressure and Bulge   OBJECTIVE:  Note: Objective measures were completed at Evaluation unless otherwise noted.  11/25/24: PFIQ-7:  FUNCTIONAL TESTS:  Squat:Rt weight shift - less notable Single leg stance:  Rt: pelvic drop  Lt: stable  Curl-up test: abdominal distortion Sit-up test: 1/3 - using bil UE support ACTIVE STRAIGHT LEG RAISE: (-)  *no pelvic floor muscle assessment   EVAL: PATIENT SURVEYS:   PFIQ-7: 72, 33, 38 (combined,  bladder, bowel)  COGNITION: Overall cognitive status: Within functional limits for tasks assessed     SENSATION: Light touch: Appears intact   FUNCTIONAL TESTS:  Squat:Rt weight shift  Single leg stance:  Rt: pelvic drop  Lt: stable  Curl-up test: abdominal distortion Sit-up test: 1/3 ACTIVE STRAIGHT LEG RAISE: (+) bil   GAIT: Assistive device utilized: None Comments: WNL  POSTURE: increased lumbar lordosis, anterior pelvic tilt, and Rt posterior rotation   LUMBARAROM/PROM: WNL   PALPATION:   General: WNL  Pelvic Alignment: Rt posterior rotation  Abdominal: apical breathing pattern                External Perineal Exam: WNL                             Internal Pelvic Floor: WNL - palpation of cervix  Patient confirms identification and approves PT to assess internal pelvic floor and treatment Yes  PELVIC MMT:   MMT eval  Vaginal 2/5, 5 second hold, 3 repeat contractions  Internal Anal Sphincter   External Anal Sphincter   Puborectalis   Diastasis Recti 2-3 finger width separation  (Blank rows = not tested)        TONE: WNL  PROLAPSE: Grade 1 anterior vaginal wall laxity and uterine ligament laxity   TODAY'S TREATMENT:  DATE:  11/25/2024 RE-EVAL Manual: Prone: Grade 2-3 PA mobilizations  Grade 3 rotational thoracic/lumbar mobilizations  Myofascial release to low back Mobilization with movement to sacral base using windshield wipers with bil LE Neuromuscular re-education: Reassessment of core  Forearm plank modified on knees 3 x 20 seconds Hand plank modified on knees 3 x 20 seconds Bride + adduction 3 x 10 Bridge march 3 x 10 Modified side plank + clam shell 10x    11/18/24 Manual: Prone: Grade 2-3 PA mobilizations  Grade 3 rotational thoracic/lumbar mobilizations  Myofascial release to low back Mobilization with  movement to sacral base using windshield wipers with bil LE Therapeutic activities: Pt education:  on impact of hormones of cervix Objective improvements in her condition over the last several weeks/months Reasonable to assume she will continue making improvements with continued exercise Impact of hormones on libido Pessary and what is reasonable to feel - making sure she is not straining too much with bowel movements due to dislodgement    11/04/2024 Manual: Prone: Grade 2-3 PA mobilizations  Grade 3 rotational thoracic/lumbar mobilizations  Myofascial release to low back Neuromuscular re-education: Bridge holds + adduction pulses 2 x 90 seconds Bridge holds + abduction pulses 2 x 90 seconds  Modified side plank + clam shells 5x bil Dead bug 2 x 10 Wall ball plank + march 2 x 10 Standing march + 3 lbs 2 x 10 Exercises: Seated lower thoracic extensions on foam roller 10x  Supine lower trunk rotation 2 x 10    PATIENT EDUCATION:  Education details: See above Person educated: Patient Education method: Programmer, Multimedia, Demonstration, Tactile cues, Verbal cues, and Handouts Education comprehension: verbalized understanding  HOME EXERCISE PROGRAM: [id number]  ASSESSMENT:  CLINICAL IMPRESSION: Patient is a 44 y.o. female who was seen today for physical therapy  treatment for core weakness, constipation, and stress urinary incontinence. Pt overall doing very well. She has seen great progress in vaginal pressure and even feeling like her cervix is sitting higher when she palpates herself. Having a pessary in has also been helpful. She is having some increase in urgency with pessary use, but believe this will improve as she gets used to different bladder position, the pressure of the pessary, and continues to use estrogen cream. Pessary displacement should continue to improve as pelvic floor muscle strength improves and she continues to work on better pressure management. She is showing  improvements in pressure management and core strength. She did very well with all strengthening today and low back pain continues to improve with mobilizations to lumbar nad lower thoracic spine. She will continue to benefit from skilled PT intervention in order improve constipation, decrease urinary incontinence, decrease dyspareunia, address all impairments, and improve quality of life.   OBJECTIVE IMPAIRMENTS: decreased activity tolerance, decreased coordination, decreased endurance, decreased mobility, decreased ROM, decreased strength, increased fascial restrictions, increased muscle spasms, impaired flexibility, impaired tone, improper body mechanics, postural dysfunction, and pain.   ACTIVITY LIMITATIONS: lifting and continence  PARTICIPATION LIMITATIONS: interpersonal relationship, community activity, occupation, and exercise  PERSONAL FACTORS: 1 comorbidity: medical history are also affecting patient's functional outcome.   REHAB POTENTIAL: Good  CLINICAL DECISION MAKING: Stable/uncomplicated  EVALUATION COMPLEXITY: Low   GOALS: Goals reviewed with patient? Yes  SHORT TERM GOALS: Target date: 07/20/24    Pt will be independent with HEP in order to improve activity tolerance.   Baseline: Goal status: MET 07/20/24  2.  Pt will be able to correctly perform diaphragmatic breathing and appropriate pressure management in  order to prevent worsening vaginal wall/uterine ligament laxity and improve pelvic floor A/ROM.    Baseline: grade 1 anterior vaginal wall and uterine ligament laxity Goal status: MET2/4/26  3.  Pt will have daily bowel movement with no straining and complete evacuation in order to put less pressure on uterine prolapse and improve pressure management.   Baseline: straining and incomplete Goal status:  MET 11/25/24  4.  Patient will be able to perform single leg stance with good pelvic stability in order to demonstrate appropriate pelvic floor muscle and  transversus abdominus strength and coordination in order to decrease urinary incontinence.   Baseline: still having pelvic drop on Rt side Goal status:  IN PROGRESS 11/25/24   LONG TERM GOALS: Target date: 07/20/24  Pt will be independent with advanced HEP in order to improve activity tolerance.   Baseline:  Goal status:  IN PROGRESS 11/25/24  2.  Pt will report no leaks with laughing, coughing, sneezing in order to improve comfort with interpersonal relationships and community activities.   Baseline: leaking; about the same Goal status:  IN PROGRESS 11/25/24  3.  Pt will report 0/10 pain with vaginal penetration in order to improve intimate relationship with partner.    Baseline: pain with certain positions; lately there has not been any pain Goal status:  IN PROGRESS 11/25/24  4.  Pt will demonstrate 3/3 on sit-up test in order to have improved core strength that will allow her to avoid injury in the future.  Baseline: 1/3; still 1/3 Goal status:  IN PROGRESS 11/25/24  5.  Pt will demonstrate no abdominal distortion with curl-up test in order to prevent abnormal forces on pelvis/pelvic floor muscles to decrease risk of worsening prolapse and bowel/bladder dysfunction. Baseline: distortion; significantly improving Goal status:  IN PROGRESS 11/25/24  6.  Pt will demonstrate normal pelvic floor muscle tone and A/ROM, able to achieve 3/5 strength with contractions and 10 sec endurance, in order to reduce urinary leaking and number of pads patient wears.   Baseline: 2/5, 5 second endurance; not assessed today Goal status:  IN PROGRESS 11/25/24  PLAN:  PT FREQUENCY: 1-2x/week  PT DURATION: 16 visits    PLANNED INTERVENTIONS: 97164- PT Re-evaluation, 97110-Therapeutic exercises, 97530- Therapeutic activity, 97112- Neuromuscular re-education, 97535- Self Care, [id number]- Manual therapy, [phone]- Gait training, [phone]- Aquatic Therapy, [phone]- Electrical stimulation (unattended), [phone]- Traction  (mechanical), [id number]- Ionotophoresis 4mg /ml Dexamethasone , 79439 (1-2 muscles), [id number] (3+ muscles)- Dry Needling, Patient/Family education, Balance training, Taping, Joint mobilization, Joint manipulation, Spinal manipulation, Spinal mobilization, Scar mobilization, Vestibular training, Cryotherapy, Moist heat, and Biofeedback  PLAN FOR NEXT SESSION: core strengthening; pressure management   Josette Mares, PT, DPT02/04/268:53 AM   "

## 2024-12-02 ENCOUNTER — Ambulatory Visit

## 2024-12-09 ENCOUNTER — Ambulatory Visit

## 2024-12-16 ENCOUNTER — Ambulatory Visit

## 2025-01-13 ENCOUNTER — Ambulatory Visit: Admitting: Physician Assistant

## 2025-02-02 ENCOUNTER — Ambulatory Visit: Admitting: Physician Assistant

## 2025-02-03 ENCOUNTER — Ambulatory Visit: Admitting: Physician Assistant
